# Patient Record
Sex: Female | Born: 1960
Health system: Southern US, Community
[De-identification: ages and names within clinical notes are randomized; demographics above are authoritative.]

## PROBLEM LIST (undated history)

## (undated) DIAGNOSIS — E119 Type 2 diabetes mellitus without complications: Secondary | ICD-10-CM

## (undated) DIAGNOSIS — I10 Essential (primary) hypertension: Secondary | ICD-10-CM

## (undated) DIAGNOSIS — J449 Chronic obstructive pulmonary disease, unspecified: Secondary | ICD-10-CM

## (undated) DIAGNOSIS — I442 Atrioventricular block, complete: Secondary | ICD-10-CM

## (undated) DIAGNOSIS — F329 Major depressive disorder, single episode, unspecified: Secondary | ICD-10-CM

## (undated) DIAGNOSIS — Z950 Presence of cardiac pacemaker: Secondary | ICD-10-CM

## (undated) DIAGNOSIS — E669 Obesity, unspecified: Secondary | ICD-10-CM

## (undated) DIAGNOSIS — Z720 Tobacco use: Secondary | ICD-10-CM

## (undated) DIAGNOSIS — F32A Depression, unspecified: Secondary | ICD-10-CM

## (undated) HISTORY — DX: Obesity, unspecified: E66.9

## (undated) HISTORY — DX: Chronic obstructive pulmonary disease, unspecified: J44.9

## (undated) HISTORY — DX: Type 2 diabetes mellitus without complications: E11.9

## (undated) HISTORY — DX: Essential (primary) hypertension: I10

## (undated) HISTORY — DX: Tobacco use: Z72.0

## (undated) HISTORY — PX: INSERT / REPLACE / REMOVE PACEMAKER: SUR710

## (undated) HISTORY — DX: Presence of cardiac pacemaker: Z95.0

## (undated) HISTORY — DX: Atrioventricular block, complete: I44.2

---

## 1998-05-07 ENCOUNTER — Encounter: Payer: Self-pay | Admitting: Emergency Medicine

## 1998-05-07 ENCOUNTER — Emergency Department (HOSPITAL_COMMUNITY): Admission: EM | Admit: 1998-05-07 | Discharge: 1998-05-07 | Payer: Self-pay | Admitting: Emergency Medicine

## 1999-03-20 ENCOUNTER — Other Ambulatory Visit: Admission: RE | Admit: 1999-03-20 | Discharge: 1999-03-20 | Payer: Self-pay | Admitting: *Deleted

## 1999-05-25 ENCOUNTER — Emergency Department (HOSPITAL_COMMUNITY): Admission: EM | Admit: 1999-05-25 | Discharge: 1999-05-25 | Payer: Self-pay | Admitting: Emergency Medicine

## 1999-10-12 ENCOUNTER — Emergency Department (HOSPITAL_COMMUNITY): Admission: EM | Admit: 1999-10-12 | Discharge: 1999-10-12 | Payer: Self-pay | Admitting: Emergency Medicine

## 1999-10-12 ENCOUNTER — Encounter (INDEPENDENT_AMBULATORY_CARE_PROVIDER_SITE_OTHER): Payer: Self-pay | Admitting: *Deleted

## 1999-10-15 ENCOUNTER — Inpatient Hospital Stay (HOSPITAL_COMMUNITY): Admission: EM | Admit: 1999-10-15 | Discharge: 1999-10-15 | Payer: Self-pay | Admitting: Obstetrics

## 1999-11-05 ENCOUNTER — Encounter: Admission: RE | Admit: 1999-11-05 | Discharge: 1999-11-05 | Payer: Self-pay | Admitting: Obstetrics & Gynecology

## 2000-02-26 ENCOUNTER — Inpatient Hospital Stay (HOSPITAL_COMMUNITY): Admission: AD | Admit: 2000-02-26 | Discharge: 2000-02-26 | Payer: Self-pay | Admitting: Obstetrics

## 2000-03-07 HISTORY — PX: TUBAL LIGATION: SHX77

## 2000-03-07 HISTORY — PX: INCONTINENCE SURGERY: SHX676

## 2000-03-12 ENCOUNTER — Encounter (INDEPENDENT_AMBULATORY_CARE_PROVIDER_SITE_OTHER): Payer: Self-pay

## 2000-03-13 ENCOUNTER — Inpatient Hospital Stay (HOSPITAL_COMMUNITY): Admission: AD | Admit: 2000-03-13 | Discharge: 2000-03-14 | Payer: Self-pay | Admitting: Obstetrics and Gynecology

## 2002-01-23 ENCOUNTER — Encounter: Payer: Self-pay | Admitting: Family Medicine

## 2002-01-23 ENCOUNTER — Ambulatory Visit (HOSPITAL_COMMUNITY): Admission: RE | Admit: 2002-01-23 | Discharge: 2002-01-23 | Payer: Self-pay | Admitting: Family Medicine

## 2002-01-23 ENCOUNTER — Emergency Department (HOSPITAL_COMMUNITY): Admission: EM | Admit: 2002-01-23 | Discharge: 2002-01-23 | Payer: Self-pay | Admitting: Emergency Medicine

## 2002-01-31 ENCOUNTER — Ambulatory Visit (HOSPITAL_COMMUNITY): Admission: RE | Admit: 2002-01-31 | Discharge: 2002-01-31 | Payer: Self-pay | Admitting: Gastroenterology

## 2002-06-10 ENCOUNTER — Other Ambulatory Visit: Admission: RE | Admit: 2002-06-10 | Discharge: 2002-06-10 | Payer: Self-pay | Admitting: Obstetrics and Gynecology

## 2003-06-15 ENCOUNTER — Other Ambulatory Visit: Admission: RE | Admit: 2003-06-15 | Discharge: 2003-06-15 | Payer: Self-pay | Admitting: Obstetrics and Gynecology

## 2004-10-09 ENCOUNTER — Other Ambulatory Visit: Admission: RE | Admit: 2004-10-09 | Discharge: 2004-10-09 | Payer: Self-pay | Admitting: Obstetrics and Gynecology

## 2005-01-06 ENCOUNTER — Ambulatory Visit: Payer: Self-pay | Admitting: Internal Medicine

## 2005-01-27 ENCOUNTER — Emergency Department (HOSPITAL_COMMUNITY): Admission: EM | Admit: 2005-01-27 | Discharge: 2005-01-27 | Payer: Self-pay | Admitting: Emergency Medicine

## 2005-03-23 ENCOUNTER — Emergency Department (HOSPITAL_COMMUNITY): Admission: EM | Admit: 2005-03-23 | Discharge: 2005-03-23 | Payer: Self-pay | Admitting: Emergency Medicine

## 2005-04-02 ENCOUNTER — Ambulatory Visit (HOSPITAL_BASED_OUTPATIENT_CLINIC_OR_DEPARTMENT_OTHER): Admission: RE | Admit: 2005-04-02 | Discharge: 2005-04-02 | Payer: Self-pay

## 2005-04-02 ENCOUNTER — Encounter (INDEPENDENT_AMBULATORY_CARE_PROVIDER_SITE_OTHER): Payer: Self-pay | Admitting: *Deleted

## 2005-04-02 ENCOUNTER — Ambulatory Visit (HOSPITAL_COMMUNITY): Admission: RE | Admit: 2005-04-02 | Discharge: 2005-04-02 | Payer: Self-pay

## 2007-08-13 ENCOUNTER — Ambulatory Visit (HOSPITAL_COMMUNITY): Admission: RE | Admit: 2007-08-13 | Discharge: 2007-08-13 | Payer: Self-pay | Admitting: Obstetrics and Gynecology

## 2007-08-13 ENCOUNTER — Encounter (INDEPENDENT_AMBULATORY_CARE_PROVIDER_SITE_OTHER): Payer: Self-pay | Admitting: Obstetrics and Gynecology

## 2008-07-21 ENCOUNTER — Emergency Department (HOSPITAL_COMMUNITY): Admission: EM | Admit: 2008-07-21 | Discharge: 2008-07-21 | Payer: Self-pay | Admitting: Family Medicine

## 2009-02-12 ENCOUNTER — Telehealth: Payer: Self-pay | Admitting: Internal Medicine

## 2009-03-29 ENCOUNTER — Inpatient Hospital Stay (HOSPITAL_COMMUNITY): Admission: EM | Admit: 2009-03-29 | Discharge: 2009-03-31 | Payer: Self-pay | Admitting: Emergency Medicine

## 2009-03-29 ENCOUNTER — Encounter (INDEPENDENT_AMBULATORY_CARE_PROVIDER_SITE_OTHER): Payer: Self-pay | Admitting: Cardiology

## 2009-03-30 HISTORY — PX: PACEMAKER INSERTION: SHX728

## 2009-04-03 ENCOUNTER — Encounter: Admission: RE | Admit: 2009-04-03 | Discharge: 2009-04-03 | Payer: Self-pay | Admitting: Cardiology

## 2009-05-07 HISTORY — PX: NM MYOCAR PERF WALL MOTION: HXRAD629

## 2010-07-07 HISTORY — PX: COLONOSCOPY: SHX174

## 2010-07-27 ENCOUNTER — Emergency Department (HOSPITAL_COMMUNITY)
Admission: EM | Admit: 2010-07-27 | Discharge: 2010-07-27 | Payer: Self-pay | Source: Home / Self Care | Admitting: Family Medicine

## 2010-09-05 HISTORY — PX: TRANSTHORACIC ECHOCARDIOGRAM: SHX275

## 2010-10-11 LAB — COMPREHENSIVE METABOLIC PANEL
Albumin: 3.5 g/dL (ref 3.5–5.2)
BUN: 11 mg/dL (ref 6–23)
Calcium: 9 mg/dL (ref 8.4–10.5)
Creatinine, Ser: 0.78 mg/dL (ref 0.4–1.2)
Total Protein: 6.4 g/dL (ref 6.0–8.3)

## 2010-10-11 LAB — URINALYSIS, ROUTINE W REFLEX MICROSCOPIC
Bilirubin Urine: NEGATIVE
Nitrite: NEGATIVE
Specific Gravity, Urine: 1.005 (ref 1.005–1.030)
pH: 6.5 (ref 5.0–8.0)

## 2010-10-11 LAB — CARDIAC PANEL(CRET KIN+CKTOT+MB+TROPI)
CK, MB: 0.8 ng/mL (ref 0.3–4.0)
Total CK: 43 U/L (ref 7–177)
Troponin I: 0.01 ng/mL (ref 0.00–0.06)
Troponin I: 0.01 ng/mL (ref 0.00–0.06)

## 2010-10-11 LAB — CBC
HCT: 38.1 % (ref 36.0–46.0)
HCT: 40.3 % (ref 36.0–46.0)
HCT: 40.7 % (ref 36.0–46.0)
Hemoglobin: 13.2 g/dL (ref 12.0–15.0)
Hemoglobin: 14 g/dL (ref 12.0–15.0)
MCHC: 34.3 g/dL (ref 30.0–36.0)
MCV: 92.6 fL (ref 78.0–100.0)
MCV: 92.8 fL (ref 78.0–100.0)
MCV: 92.9 fL (ref 78.0–100.0)
Platelets: 146 10*3/uL — ABNORMAL LOW (ref 150–400)
Platelets: 176 10*3/uL (ref 150–400)
Platelets: 181 10*3/uL (ref 150–400)
RBC: 4.24 MIL/uL (ref 3.87–5.11)
RBC: 4.4 MIL/uL (ref 3.87–5.11)
RDW: 13.1 % (ref 11.5–15.5)
WBC: 7 10*3/uL (ref 4.0–10.5)
WBC: 7.2 10*3/uL (ref 4.0–10.5)
WBC: 9.1 10*3/uL (ref 4.0–10.5)

## 2010-10-11 LAB — PROTIME-INR: Prothrombin Time: 12.9 seconds (ref 11.6–15.2)

## 2010-10-11 LAB — BASIC METABOLIC PANEL
BUN: 12 mg/dL (ref 6–23)
BUN: 9 mg/dL (ref 6–23)
Chloride: 105 mEq/L (ref 96–112)
Chloride: 108 mEq/L (ref 96–112)
Creatinine, Ser: 0.78 mg/dL (ref 0.4–1.2)
GFR calc Af Amer: >60 mL/min (ref >60)
GFR calc non Af Amer: >60 mL/min (ref >60)
GFR calc non Af Amer: >60 mL/min (ref >60)
GFR calc non Af Amer: >60 mL/min (ref >60)
Glucose, Bld: 131 mg/dL — ABNORMAL HIGH (ref 70–99)
Potassium: 3.6 mEq/L (ref 3.5–5.1)
Potassium: 4.3 mEq/L (ref 3.5–5.1)
Potassium: 4.3 mEq/L (ref 3.5–5.1)
Sodium: 136 mEq/L (ref 135–145)
Sodium: 139 mEq/L (ref 135–145)

## 2010-10-11 LAB — LIPID PANEL
HDL: 50 mg/dL (ref >39)
LDL Cholesterol: 69 mg/dL (ref 0–99)
Triglycerides: 123 mg/dL (ref <150)
VLDL: 25 mg/dL (ref 0–40)

## 2010-10-11 LAB — DIFFERENTIAL
Eosinophils Relative: 0 % (ref 0–5)
Lymphocytes Relative: 27 % (ref 12–46)
Lymphocytes Relative: 27 % (ref 12–46)
Lymphs Abs: 2.5 10*3/uL (ref 0.7–4.0)
Monocytes Absolute: 0.5 10*3/uL (ref 0.1–1.0)
Monocytes Absolute: 0.7 10*3/uL (ref 0.1–1.0)
Monocytes Relative: 6 % (ref 3–12)
Neutro Abs: 5.2 10*3/uL (ref 1.7–7.7)
Neutrophils Relative %: 67 % (ref 43–77)

## 2010-10-11 LAB — HEMOGLOBIN A1C: Hgb A1c MFr Bld: 5.6 % (ref 4.6–6.1)

## 2010-10-11 LAB — APTT: aPTT: 35 seconds (ref 24–37)

## 2010-10-11 LAB — POCT CARDIAC MARKERS: Troponin i, poc: <0.05 ng/mL (ref 0.00–0.09)

## 2010-10-11 LAB — BRAIN NATRIURETIC PEPTIDE: Pro B Natriuretic peptide (BNP): 43 pg/mL (ref 0.0–100.0)

## 2010-10-11 LAB — CK TOTAL AND CKMB (NOT AT ARMC)
CK, MB: 0.9 ng/mL (ref 0.3–4.0)
Relative Index: INVALID (ref 0.0–2.5)

## 2010-10-11 LAB — RAPID URINE DRUG SCREEN, HOSP PERFORMED
Barbiturates: NOT DETECTED
Cocaine: NOT DETECTED
Opiates: NOT DETECTED
Tetrahydrocannabinol: NOT DETECTED

## 2010-10-11 LAB — TSH: TSH: 4.015 u[IU]/mL (ref 0.350–4.500)

## 2010-11-19 NOTE — Op Note (Signed)
Mandy Sellers, Mandy Sellers             ACCOUNT NO.:  [id number]   MEDICAL RECORD NO.:  [id number]          PATIENT TYPE:  AMB   LOCATION:  SDC                           FACILITY:  [facility]   PHYSICIAN:  Juluis Mire, M.D.   DATE OF BIRTH:  [birth year]   DATE OF PROCEDURE:  08/13/2007  DATE OF DISCHARGE:                               OPERATIVE REPORT   PREOPERATIVE DIAGNOSIS:  Endometrial polyps.   POSTOPERATIVE DIAGNOSIS:  Endometrial polyps.   PROCEDURE:  Paracervical block, cervical dilatation, hysteroscopy,  resection of polyps and endometrial curettings.   SURGEON:  Juluis Mire, M.D.   ANESTHESIA:  Paracervical block as well as general.   ESTIMATED BLOOD LOSS:  Minimal.   PACKS AND DRAINS:  None.   INTRAOPERATIVE BLOOD REPLACED:  None.   COMPLICATIONS:  None.   INDICATIONS:  Were dictated in history and physical.   PROCEDURE IN DETAIL:  The patient was taken to the OR and placed in  supine position.  After satisfactory level of general anesthesia was  obtained the patient was placed in a dorsal lithotomy position using the  Allen stirrups.  Perineum and vagina were prepped out with Betadine and  draped in a sterile field.  Speculum was placed in the vaginal vault.  Cervix was grasped with a single tooth tenaculum.  Paracervical block  was instituted using 1% Nesacaine.  The uterus was sounded to 8 cm.  The  cervix was serially dilated to a size 35 Pratt dilator.  Operative  hysteroscope was introduced and the intrauterine cavity was distended  using sorbitol.  Visualization did reveal multiple polyps along the  posterior and anterior wall.  These were all resected and sent for  pathological review.  At the end we had resected all the polyps.  We  then did endometrial curettings.  All this was sent for pathological  review.  Deficit was minimal.  There was no evidence of perforation or  active bleeding.  At this point the  hysteroscope was removed.  The speculum and single  tooth tenaculum were  removed.  The patient was taken out of the dorsal lithotomy position and  was alert and extubated and transferred to recovery room in good  condition.  Sponge, instrument and needle count was correct by  circulating nurse x2.      Juluis Mire, M.D.  Electronically Signed     JSM/MEDQ  D:  08/13/2007  T:  08/14/2007  Job:  [id number]

## 2010-11-19 NOTE — H&P (Signed)
NAME:  Mandy Sellers, Mandy Sellers NO.:  [id number]   MEDICAL RECORD NO.:  [id number]          PATIENT TYPE:  AMB   LOCATION:  SDC                           FACILITY:  [facility]   PHYSICIAN:  Juluis Mire, M.D.   DATE OF BIRTH:  09-23-1960   DATE OF ADMISSION:  08/13/2007  DATE OF DISCHARGE:                              HISTORY & PHYSICAL   REASON FOR PRESENTATION:  The patient is a 50 year old gravida 6, para  3, abortus 3 female who presents for hysteroscopy.   Prior to admission, the patient was having some lower abdominal pain and  discomfort. Underwent an ultrasound.  There was increase in endometrial  thickness.  Subsequent saline infusion ultrasound did reveal multiple  endometrial polyps.  She had some free fluid on ultrasound that  resolved, otherwise it was unremarkable.  Her cycles remain relatively  regular at the present time.  There has not been any significant change  in flow or dysmenorrhea, but again multiple polyps were noted.  She  presents now for hysteroscopic evaluation and resection.   ALLERGIES:  NO KNOWN DRUG ALLERGIES.   MEDICATIONS:  Vitamins.   PAST MEDICAL HISTORY:  Usual childhood diseases without any significant  sequelae.   PAST SURGICAL HISTORY:  1. She has had a previous retropubic suspension of the bladder.  2. Bilateral tubal ligation in 2001.  3. She has had 3 vaginal deliveries.   FAMILY HISTORY:  History of heart disease as well as diabetes.   SOCIAL HISTORY:  Does reveal 1-1/2 packs a day of tobacco use and  occasional alcohol use.   REVIEW OF SYSTEMS:  Noncontributory.   PHYSICAL EXAMINATION:  VITAL SIGNS:  Stable.  The patient is afebrile.  HEENT:  Face is normocephalic.  Pupils are equal, round, and reactive to  light and accommodation.  Extraocular movements were intact.  Sclerae  and conjunctivae are clear.  Oropharynx clear.  NECK:  Without thyromegaly.  BREASTS:  No discrete masses.  LUNGS:  Clear.  CARDIOVASCULAR:   Regular in rate.  No murmurs or gallops.  ABDOMEN:  Benign.  PELVIC:  Normal external genitalia.  Vaginal cuff is clear.  Cervix  unremarkable.  Usual shape, size and contour.  Adnexa free of mass or  tenderness.  EXTREMITIES:  Trace edema.  NEUROLOGIC:  Grossly within normal limits.   IMPRESSION:  Endometrial polyps.   PLAN:  The patient will undergo hysteroscopy and resection of polyps.  The risks of surgery have been discussed including the risk of  infection.  The risk of vascular injury that could lead to hemorrhage  requiring transfusion with the risk of AIDS or hepatitis and the  possible need for hysterectomy.  Risk of injury to adjacent organs  including bladder, bowel, or ureters could require further exploratory  surgery.  Risk of deep venous thrombosis and pulmonary embolus.  The  patient voiced understanding of indications and risks.      Juluis Mire, M.D.  Electronically Signed     JSM/MEDQ  D:  08/13/2007  T:  08/14/2007  Job:  [id number]

## 2010-11-22 NOTE — Op Note (Signed)
TNAMESYLVANIA, MOSS                      ACCOUNT NO.:  [id number]   MEDICAL RECORD NO.:  [id number]                   PATIENT TYPE:  AMB   LOCATION:  ENDO                                 FACILITY:  [facility]   PHYSICIAN:  Charolett Bumpers, M.D.             DATE OF BIRTH:  07-13-1960   DATE OF PROCEDURE:  01/31/2002  DATE OF DISCHARGE:  01/31/2002                                 OPERATIVE REPORT   REFERRING PHYSICIAN:  Zigmund Daniel, M.D. and Thora Lance, M.D.   INDICATIONS FOR PROCEDURE:  Mandy Sellers is a 50 year old female born  on 1960-11-01.  Ms. Berland recently received therapy for presumptive  acute diverticulitis.  Her symptoms have resolved on antibiotic therapy.  CT  scan of the abdomen and pelvis performed on January 23, 2002, revealed focal  diverticulitis involving the ascending colon near the hepatic flexure versus  a colon cancer with adjacent inflammation or invasion.  Diagnostic  colonoscopy is scheduled to evaluate the colonic mucosa.   I discussed with Ms. Markson the complications associated with colonoscopy  and polypectomy including a 15 per 1000 risk of bleeding and 4 per 1000 risk  of colon perforation requiring surgical repair.  Ms. Bobrowski has signed the  operative permit.   ENDOSCOPIST:  Charolett Bumpers, M.D.   PREMEDICATION:  Versed 5 mg, Demerol 50 mg.   ENDOSCOPE:  Olympus pediatric colonoscope.   DESCRIPTION OF PROCEDURE:  After obtaining informed consent, Ms. Tebbetts was  placed into the left lateral decubitus position.  I administered intravenous  Demerol and intravenous Versed to achieve conscious sedation for the  procedure.  The patient's blood pressure, oxygen saturation, and cardiac  rhythm were monitored throughout the procedure and documented in the medical  record.   Anal inspection was normal.  Digital rectal examination was normal.  The  Olympus pediatric video colonoscope was introduced into the rectum and  advanced to the cecum.  A normal appearing ileocecal valve was intubated and  the distal ileum inspected.  Colonic preparation for the examination today  was excellent.   Rectum normal.   Sigmoid colon and descending colon.  A few small diverticula are noted.   Splenic flexure normal.   Transverse colon normal.   Hepatic flexure and ascending colon.  The mucosa appears normal.  There are  a few small diverticula noted in the right colon.   Cecum and ileocecal valve normal.   Distal ileum normal.   ASSESSMENT:  There are a few small diverticula involving the right colon and  left colon; otherwise normal proctocolonoscopy to the cecum.  No endoscopic  evidence for the presence of colorectal neoplasia, inflammatory bowel  disease, or active diverticulitis.  Charolett Bumpers, M.D.    MKJ/MEDQ  D:  01/31/2002  T:  02/03/2002  Job:  [id number]   cc:   Thora Lance, M.D.   Skeet Simmer., M.D.  Fax: [phone]

## 2010-11-22 NOTE — H&P (Signed)
[facility]  Patient:    Mandy Sellers, Mandy Sellers                         MRN: [id number] Adm. Date:  03/12/00 Attending:  Juluis Mire, M.D.                         History and Physical  CHIEF COMPLAINT:              The patient is a 50 year old gravida 5 para 3 abortus 2 married white female, who presents for retropubic suspension of the bladder and bilateral tubal ligation.  HISTORY OF PRESENT ILLNESS:   In relation to the present admission, the patient initially was referred to our practice on Dec 04, 1999 by Dr. Valentina Lucks for evaluation of stress urinary incontinence and pelvic relaxation.  The patient was also desirous of permanent sterilization.  The patient described urinary leakage with activity such as coughing, sneezing, or heavy lifting. This was becoming extremely limiting in terms of her activity.  Her cycles were regular at that time with no significant discomfort or change in flow pattern.  She had had a normal gynecologic examination and Pap smear in September 2000.  Subsequently we did do bladder evaluation in the office and she had a positive Q-tip test.  She had normal filling volumes and no evidence of uninhibited bladder contractions.  She did demonstrate a small amount of leakage with coughing consistent with anatomical stress urinary incontinence. Mild pelvic relaxation was also noted with uterine descensus, cystocele, and rectocele.  After discussion of options the patient decided to proceed with permanent sterilization as well as retropubic suspension of the bladder.  We discussed the option of hysterectomy at the same time, which she does decline. In terms of sterilization, alternative forms of birth control were discussed. A failure rate of 1/200 was quoted with failure resulting in form of ectopic pregnancy requiring further surgical management.  ALLERGIES:                    No known drug allergies.  MEDICATIONS:                   None.  PAST MEDICAL HISTORY:         Usual childhood diseases with no significant sequelae.  PAST SURGICAL HISTORY:        No previous surgical history.  PAST OBSTETRICAL HISTORY:     1. Three spontaneous vaginal deliveries.                               2. Two spontaneous abortions.  FAMILY HISTORY:               Father has a history of hypertension and heart disease.  Both parents have diabetes mellitus.  SOCIAL HISTORY:               Smokes approximately a pack of cigarettes per day.  Occasional alcohol use.  REVIEW OF SYSTEMS:            Noncontributory.  PHYSICAL EXAMINATION:  VITAL SIGNS:                  The patient is afebrile and has stable vital signs.  HEENT:  Head normocephalic.  PERRLA.  EOMI.  Sclerae and conjunctivae clear.  Oropharynx clear.  NECK:                         Without thyromegaly.  BREAST:                       Not examined.  LUNGS:                        Clear.  CARDIAC:                      Regular rate and rhythm without murmurs or gallops.  ABDOMEN:                      Benign.  PELVIC:                       Normal external genitalia.  Vaginal mucosa with cystocele, rectocele, and mild uterine descensus.  Uterus normal size, shape, and contour.  No discrete masses or tenderness.  EXTREMITIES:                  Trace edema.  NEUROLOGIC:                   Grossly within normal limits.  IMPRESSION:                   1. Stress urinary incontinence with associated                                  pelvic relaxation.                               2. Multiparity, desire for sterility.  PLAN:                         The patient is to undergo retropubic suspension of the bladder and bilateral tubal ligation.  The risks of surgery have been discussed including the risks of anesthesia, the risks of infection, the risks of hemorrhage which could necessitate transfusion with the risk of AIDS or hepatitis, the risk of injury  to adjacent organs including bladder, bowel, or ureters that could require further exploratory surgery, the risk of deep vein thrombosis and pulmonary emboli.  With retropubic suspension of the bladder we will also place a suprapubic catheter and the need for bladder retraining was discussed.  We have discussed the potential need for long-term catheterization.  At times there have been reports of need to take down the retropubic suspension so that voiding function can return.  We have also discussed the overall success rate of 80-90% with potential recurrent urinary leakage in the future requiring further surgical management.  The patient expressed understanding of the indications, risks, and options. DD:  03/12/00 TD:  03/12/00 Job: [id number]

## 2010-11-22 NOTE — Op Note (Signed)
[facility]  Patient:    Mandy Sellers, Mandy Sellers                       MRN: [id number] Proc. Date: 03/12/00 Adm. Date:  [id number] Attending:  Frederich Balding                           Operative Report  PREOPERATIVE DIAGNOSES:       1. Anatomical stress urinary incontinence.                               2. Multiparity.                               3. Desires sterility.  POSTOPERATIVE DIAGNOSES:      1. Anatomical stress urinary incontinence.                               2. Multiparity.                               3. Desires sterility.  OPERATIVE PROCEDURE:          1. Retropubic suspension of bladder using                                  the Burch procedure.                               2. Bilateral tubal ligation.                               3. Cystotomy with placement of suprapubic                                  catheter.  SURGEON:                      Juluis Mire, M.D.  ASSISTANTFreddy Finner, M.D.  ANESTHESIA:                   General endotracheal.  ESTIMATED BLOOD LOSS:         200 cc.  PACKS AND DRAINS:             None.  INTRAOPERATIVE BLOOD:         None.  COMPLICATIONS:                None.  INDICATIONS:                  Dictated in the history and physical.  DESCRIPTION OF PROCEDURE:     The patient was taken to the operating room and placed in the supine position.  After a satisfactory level of general endotracheal anesthesia was obtained, the patient was placed in the dorsal supine position using Allen stirrups.  The abdomen, perineum,  and vagina were prepped out with Betadine.  Examination under anesthesia revealed the uterus to be anterior, normal size and shape.  The adnexa were unremarkable.  A three-way Foley is put in place.  The patient is draped out for surgery.  A low transverse skin incision is made with the knife and carried to the subcutaneous tissue.  The anterior rectus fascia  was entered sharply and the incision in the fascia was then extended laterally.  The fascia was then taken off of the muscle superiorly and inferiorly.  The rectus muscles were then separated in the midline.  A Balfour retractor was put in place to separate the muscles.  Next, the retroperitoneal space was developed.  The gloved hand was placed in the vaginal vault for elevation.  The space of Retzius easily developed.  The bladder was dissected off of the underlying perivaginal tissue bilaterally using blunt dissection.  Next, using #2-0 sutures of Prolene, two helical sutures were placed at each side of the urethrovesical angle and secured to the Coopers ligament.  Two more lateral sutures of #2-0 Prolene were also placed on each side more laterally.  These were all secured to Coopers ligaments, and tied down with good elevation of the bladder, and elevation of the urethrovesical angle.  There was no active bleeding.  We had excellent hemostasis.  Next, the peritoneum was incised.  The incision in the peritoneum was extended both superiorly and inferiorly.  There was a small nick in the small bowel. It only went to the muscularis, not into the mucosa.  This was closed with one interrupted suture of #3-0 Vicryl.  Next the uterus was elevated.  The tubes and ovaries were identified and noted to be unremarkable.  A midsegment of each tube was elevated with the Babcock tenaculum.  A hole was made in the avascular mesosalpinx.  Individual ligatures of #0 plain catgut were used to ligate off a segment of tube.  The intervening segment of tube was  then excised.  The cut end of the tubes were cauterized using the Bovie.  Good hemostasis was noted.  We had excellent hemostasis.  She really had a very shallow cul-de-sac.  We did not try to do a culdoplasty, because it was so shallow.  At this point in time the peritoneum was closed in a running suture of #3-0 Vicryl.  Next, the bladder was filled  with a three-way Foley with sterile saline.  A cystotomy incision was made and the cystoscope was then reduced into the bladder fundus.  Visualization revealed no evidence of suture into the bladder mucosa.  Both ureteral orifices were visualized.  The patient had been given indigo carmine, and she had free spillage of blue urine bilaterally.  Next, a bonnano catheter was put in place through the cystotomy incision, and sutures of #3-0 Vicryl were used to bring the muscularis over the cystotomy site.  This was intact to further distention with saline.  The bonnano catheter was secured to the skin with silk.  The rectus muscles were then brought together with #3-0 Vicryl.  The fascia was closed with running sutures of #0 PDS.  The skin was closed with staples and Steri-Strips.  The sponge, instrument, and needle counts were reported as correct by the circulating nurse x 2.  The patient tolerated the procedure well, once extubated, was transferred to the recovery room in good condition. DD:  03/12/00 TD:  03/14/00 Job: [id number]

## 2010-11-22 NOTE — Op Note (Signed)
NAMENOVALEIGH, Mandy Sellers NO.:  [id number]   MEDICAL RECORD NO.:  [id number]          PATIENT TYPE:  AMB   LOCATION:  DSC                          FACILITY:  [facility]   PHYSICIAN:  Lorre Munroe., M.D.DATE OF BIRTH:  July 28, 1960   DATE OF PROCEDURE:  04/02/2005  DATE OF DISCHARGE:                                 OPERATIVE REPORT   PREOPERATIVE DIAGNOSIS:  Pilonidal cyst.   POSTOPERATIVE DIAGNOSIS:  Pilonidal cyst.   OPERATION:  Pilonidal cystectomy.   SURGEON:  Lebron Conners, M.D.   ANESTHESIA:  General and local.   SPECIMEN:  Pilonidal cyst.   BLOOD LOSS:  Minimal.   COMPLICATIONS:  None.   INDICATIONS:  To PACU good.   PROCEDURE:  After the patient was monitored and anesthetized, turned in  prone position and had routine preparation and draping of the area over the  sacrum and coccyx, I liberally infused local anesthetic in that area as  well. I made an elliptical incision around all of the midline sinuses and  scars on the side where drainage had been done. Hair was noted in the  pilonidal cyst. There was chronic granulation tissue present and I excised  all the granulation tissue. Only normal fat remained in the depths of the  wound after excision of the cyst. I got hemostasis with cautery and packed  the wound with moist gauze, and applied a bulky compressive bandage. The  patient tolerated the operation well.      Lorre Munroe., M.D.  Electronically Signed     WB/MEDQ  D:  04/02/2005  T:  04/03/2005  Job:  [id number]

## 2010-11-22 NOTE — Discharge Summary (Signed)
[facility]  Patient:    Mandy Sellers, Mandy Sellers                       MRN: [id number] Adm. Date:  [id number] Disc. Date: [id number] Attending:  Frederich Balding                           Discharge Summary  ADMITTING DIAGNOSES:          Anatomical stress urinary incontinence,                               multipara, desires sterility.  OPERATIVE PROCEDURE:          Retropubic suspension of the bladder with bilateral tubal ligation.  Cystoscopy and placement of suprapubic catheter.  For complete history and physical, please see dictated noted.  HOSPITAL COURSE:              The patient underwent the above-noted surgery. Postoperatively, she did well.  Postoperative hemoglobin was 11.3.  During her first postoperative day, she was maintained ______ due to some nausea and diet intolerance.  We began suprapubic clamping.  By the following morning, postvoid residuals were under 100 cc, and the suprapubic catheter was removed. At the time of discharge, she was afebrile.  She was tolerating a regular diet without difficulty.  She had a low transverse incision which was intact. Bowel sounds were active.  COMPLICATIONS:                None.  CONDITION ON DISCHARGE:       The patient was discharged home in stable condition.  DISPOSITION:                  Routine postoperative instructions are given. She is to avoid heavy lifting, vaginal entry, or driving a car.  She will watch for signs of infection, nausea, vomiting, increased abdominal pain, or active vaginal bleeding.  FOLLOW-UP:                    Follow up in the office in one week. DD:  03/14/00 TD:  03/15/00 Job: [id number]

## 2011-02-19 ENCOUNTER — Encounter: Payer: Self-pay | Admitting: Internal Medicine

## 2011-03-28 LAB — CBC
HCT: 43.5
Hemoglobin: 15.3 — ABNORMAL HIGH
MCHC: 35.2
Platelets: 197
RDW: 13

## 2011-04-01 ENCOUNTER — Other Ambulatory Visit: Payer: Self-pay | Admitting: Internal Medicine

## 2011-04-10 ENCOUNTER — Ambulatory Visit (HOSPITAL_COMMUNITY)
Admission: RE | Admit: 2011-04-10 | Discharge: 2011-04-10 | Disposition: A | Discharge status: Home / Self Care | Payer: 59 | Source: Ambulatory Visit | Attending: Gastroenterology | Admitting: Gastroenterology

## 2011-04-10 DIAGNOSIS — Z1211 Encounter for screening for malignant neoplasm of colon: Secondary | ICD-10-CM | POA: Insufficient documentation

## 2011-04-15 NOTE — Op Note (Signed)
  NAMETARISA, [name] NO.:  [id number]  MEDICAL RECORD NO.:  [id number]  LOCATION:  WLEN                         FACILITY:  [facility]  PHYSICIAN:  Danise Edge, M.D.   DATE OF BIRTH:  [birth year]  DATE OF PROCEDURE:  04/10/2011 DATE OF DISCHARGE:                              OPERATIVE REPORT   REFERRING PHYSICIAN:  Thora Lance, M.D.  HISTORY:  Ms. Mandy Sellers is a 50 year old female, scheduled to undergo a repeat screening colonoscopy with polypectomy to prevent colon cancer.  The patient underwent a normal colonoscopy in 2003.  ENDOSCOPIST:  Danise Edge, M.D.  PREMEDICATION: 1. Fentanyl 100 mcg. 2. Versed 10 mg.  PROCEDURE:  The patient was placed in the left lateral decubitus position.  Anal inspection and digital rectal exam were normal.  The Pentax pediatric colonoscope was introduced into the rectum and easily advanced to the cecum.  A normal-appearing ileocecal valve and appendiceal orifice were identified.  Colonic preparation for the exam today was good.  Rectum normal.  Retroflexed view of the distal rectum normal.  Sigmoid colon.  There were a few tiny diverticula present.  Descending colon normal.  Splenic flexure normal.  Transverse colon normal.  Hepatic flexure normal.  Ascending colon normal.  Cecum and ileocecal valve normal.  ASSESSMENT:  Normal screening proctocolonoscopy of the cecum.  RECOMMENDATIONS:  Repeat screening colonoscopy in 10 years.          ______________________________ Danise Edge, M.D.     MJ/MEDQ  D:  04/10/2011  T:  04/10/2011  Job:  [id number]  cc:   Thora Lance, M.D. Fax: [phone]  Electronically Signed by Danise Edge M.D. on 04/15/2011 04:09:44 PM

## 2012-08-10 ENCOUNTER — Encounter (HOSPITAL_COMMUNITY): Payer: Self-pay | Admitting: *Deleted

## 2012-08-10 ENCOUNTER — Emergency Department (HOSPITAL_COMMUNITY)
Admission: EM | Admit: 2012-08-10 | Discharge: 2012-08-10 | Disposition: A | Discharge status: Home / Self Care | Payer: 59 | Source: Home / Self Care | Attending: Emergency Medicine | Admitting: Emergency Medicine

## 2012-08-10 DIAGNOSIS — A088 Other specified intestinal infections: Secondary | ICD-10-CM

## 2012-08-10 DIAGNOSIS — A084 Viral intestinal infection, unspecified: Secondary | ICD-10-CM

## 2012-08-10 MED ORDER — ONDANSETRON 4 MG PO TBDP
8.0000 mg | ORAL_TABLET | Freq: Once | ORAL | Status: AC
  Administered 2012-08-10: 8 mg via ORAL

## 2012-08-10 MED ORDER — DIPHENOXYLATE-ATROPINE 2.5-0.025 MG PO TABS: 1.0000 | ORAL_TABLET | Freq: Four times a day (QID) | ORAL | Status: DC | PRN

## 2012-08-10 MED ORDER — ONDANSETRON 8 MG PO TBDP: 8.0000 mg | ORAL_TABLET | Freq: Three times a day (TID) | ORAL | Status: DC | PRN

## 2012-08-10 MED ORDER — ONDANSETRON 4 MG PO TBDP
ORAL_TABLET | ORAL | Status: AC
  Filled 2012-08-10: qty 2

## 2012-08-10 NOTE — ED Provider Notes (Signed)
Chief Complaint  Patient presents with  . Emesis    History of Present Illness:   Mandy Sellers is a 52 year old female who has had a two-day history of nausea, vomiting, and diarrhea. There has been a family exposure. She had 4 episodes of emesis yesterday and none today. The emesis has been yellow in color. She denies any hematemesis, coffee-ground emesis, or bilious emesis. She's also had diarrhea with 6+ stools yesterday and today. No hematochezia or melena. She has noted fever, chills, myalgias, weakness, and upper abdominal pain. She has some URI symptoms last week. She has a pacemaker and high blood pressure. She is a cigarette smoker. She takes antiarrhythmic for heart, venlafaxine, hydrochlorothiazide, and aspirin.  Review of Systems:  Other than noted above, the patient denies any of the following symptoms: Systemic:  No fevers, chills, sweats, weight loss or gain, fatigue, or tiredness. ENT:  No nasal congestion, rhinorrhea, or sore throat. Lungs:  No cough, wheezing, or shortness of breath. Cardiac:  No chest pain, syncope, or presyncope. GI:  No abdominal pain, nausea, vomiting, anorexia, diarrhea, constipation, blood in stool or vomitus. GU:  No dysuria, frequency, or urgency.  PMFSH:  Past medical history, family history, social history, meds, and allergies were reviewed.  Physical Exam:   Vital signs:  BP 118/78  Pulse 87  Temp 98.6 F (37 C) (Oral)  Resp 26  SpO2 93%  LMP 07/10/2012 General:  Alert and oriented.  In no distress.  Skin warm and dry.  Good skin turgor, brisk capillary refill. ENT:  No scleral icterus, moist mucous membranes, no oral lesions, pharynx clear. Lungs:  Breath sounds clear and equal bilaterally.  No wheezes, rales, or rhonchi. Heart:  Rhythm regular, without extrasystoles.  No gallops or murmers. Abdomen:  There is slight epigastric tenderness to palpation, no guarding or rebound, no organomegaly or mass. Bowel sounds are hyperactive. Skin:  Clear, warm, and dry.  Good turgor.  Brisk capillary refill.  Course in Urgent Care Center:   She was given Zofran ODT one sublingually.  Assessment:  The encounter diagnosis was Viral gastroenteritis.  Plan:   1.  The following meds were prescribed:   New Prescriptions   DIPHENOXYLATE-ATROPINE (LOMOTIL) 2.5-0.025 MG PER TABLET    Take 1 tablet by mouth 4 (four) times daily as needed for diarrhea or loose stools.   ONDANSETRON (ZOFRAN ODT) 8 MG DISINTEGRATING TABLET    Take 1 tablet (8 mg total) by mouth every 8 (eight) hours as needed for nausea.   2.  The patient was instructed in symptomatic care and handouts were given. 3.  The patient was told to return if becoming worse in any way, if no better in 2 or 3 days, and given some red flag symptoms that would indicate earlier return. 4.  The patient was told to take only sips of clear liquids for the next 24 hours and then advance to a b.r.a.t. Diet.      Reuben Likes, MD 08/10/12 2221

## 2012-08-10 NOTE — ED Notes (Signed)
PT  REPORTS  SYMPTOMS  OF  VOMITING  /  DIARRHEA              SINCE YESTERDAY            RECENTLTY     GOT  OVER  AN URI           SHE  REPORTS  YEST  WEAS  UNABLE  TO  HOLD  DOWN LIQUIDS  BUT  TODAY  SHE IS ABLE  TO

## 2012-08-23 ENCOUNTER — Emergency Department (INDEPENDENT_AMBULATORY_CARE_PROVIDER_SITE_OTHER): Payer: 59

## 2012-08-23 ENCOUNTER — Emergency Department (HOSPITAL_COMMUNITY)
Admission: EM | Admit: 2012-08-23 | Discharge: 2012-08-23 | Disposition: A | Discharge status: Home / Self Care | Payer: 59 | Source: Home / Self Care | Attending: Emergency Medicine | Admitting: Emergency Medicine

## 2012-08-23 ENCOUNTER — Encounter (HOSPITAL_COMMUNITY): Payer: Self-pay | Admitting: *Deleted

## 2012-08-23 DIAGNOSIS — J189 Pneumonia, unspecified organism: Secondary | ICD-10-CM

## 2012-08-23 HISTORY — DX: Depression, unspecified: F32.A

## 2012-08-23 HISTORY — DX: Essential (primary) hypertension: I10

## 2012-08-23 HISTORY — DX: Major depressive disorder, single episode, unspecified: F32.9

## 2012-08-23 MED ORDER — CEFTRIAXONE SODIUM 1 G IJ SOLR
INTRAMUSCULAR | Status: AC
  Filled 2012-08-23: qty 10

## 2012-08-23 MED ORDER — LIDOCAINE HCL (PF) 1 % IJ SOLN
INTRAMUSCULAR | Status: AC
  Filled 2012-08-23: qty 5

## 2012-08-23 MED ORDER — CEFTRIAXONE SODIUM 1 G IJ SOLR
1.0000 g | Freq: Once | INTRAMUSCULAR | Status: AC
  Administered 2012-08-23: 1 g via INTRAMUSCULAR

## 2012-08-23 MED ORDER — AZITHROMYCIN 250 MG PO TABS: 250.0000 mg | ORAL_TABLET | Freq: Every day | ORAL | Status: DC

## 2012-08-23 NOTE — ED Notes (Signed)
Pt reports that she has been sick for the past 5 weeks with URI & stomach flu. Productive cough, body aches, sinus pressure

## 2012-08-23 NOTE — ED Provider Notes (Signed)
History     CSN: [id number]  Arrival date & time 08/23/12  1337   First MD Initiated Contact with Patient 08/23/12 1420      Chief Complaint  Patient presents with  . URI    (Consider location/radiation/quality/duration/timing/severity/associated sxs/prior treatment) Patient is a 52 y.o. female presenting with URI. The history is provided by the patient. No language interpreter was used.  URI Presenting symptoms: congestion and cough   Severity:  Moderate Onset quality:  Gradual Duration:  5 weeks Timing:  Constant Progression:  Worsening Chronicity:  New Relieved by:  Nothing Worsened by:  Nothing tried Associated symptoms: sinus pain   Risk factors comment:  Smoker   Past Medical History  Diagnosis Date  . Hypertension   . Depression     History reviewed. No pertinent past surgical history.  Family History  Problem Relation Age of Onset  . Family history unknown: Yes    History  Substance Use Topics  . Smoking status: Heavy Tobacco Smoker -- 1.50 packs/day    Types: Cigarettes  . Smokeless tobacco: Not on file  . Alcohol Use: Yes     Comment: social    OB History   Grav Para Term Preterm Abortions TAB SAB Ect Mult Living                  Review of Systems  HENT: Positive for congestion.   Respiratory: Positive for cough.   All other systems reviewed and are negative.    Allergies  Review of patient's allergies indicates no known allergies.  Home Medications   Current Outpatient Rx  Name  Route  Sig  Dispense  Refill  . BAYER ASPIRIN PO   Oral   Take by mouth.         . clonazePAM (KLONOPIN) 1 MG tablet   Oral   Take 1 mg by mouth 2 (two) times daily as needed.         . diphenoxylate-atropine (LOMOTIL) 2.5-0.025 MG per tablet   Oral   Take 1 tablet by mouth 4 (four) times daily as needed for diarrhea or loose stools.   16 tablet   0   . HYDROCHLOROTHIAZIDE PO   Oral   Take by mouth.         . Nebivolol HCl (BYSTOLIC  PO)   Oral   Take by mouth.         . ondansetron (ZOFRAN ODT) 8 MG disintegrating tablet   Oral   Take 1 tablet (8 mg total) by mouth every 8 (eight) hours as needed for nausea.   20 tablet   0   . venlafaxine (EFFEXOR) 75 MG tablet   Oral   Take 75 mg by mouth 2 (two) times daily.           BP 132/74  Pulse 101  Temp(Src) 97.4 F (36.3 C) (Oral)  SpO2 95%  LMP 07/10/2012  Physical Exam  Nursing note and vitals reviewed. Constitutional: She appears well-developed and well-nourished.  HENT:  Head: Normocephalic.  Right Ear: External ear normal.  Left Ear: External ear normal.  Nose: Nose normal.  Mouth/Throat: Oropharynx is clear and moist.  Eyes: Conjunctivae and EOM are normal. Pupils are equal, round, and reactive to light.  Neck: Normal range of motion. Neck supple.  Cardiovascular: Normal rate.   Pulmonary/Chest: Effort normal.  Abdominal: Soft.  Musculoskeletal: Normal range of motion.  Neurological: She is alert.  Skin: Skin is warm.    ED Course  Procedures (including critical care time)  Labs Reviewed - No data to display Dg Chest 2 View  08/23/2012  *RADIOLOGY REPORT*  Clinical Data: Cough.  CHEST - 2 VIEW  Comparison: Two-view chest 04/03/2009.  Findings: New right middle lobe airspace disease is present.  The heart is mildly enlarged.  Mild pulmonary vascular congestion is evident.  The visualized soft tissues and bony thorax are unremarkable.  Pacing wires are stable in position.  IMPRESSION:  1.  New right middle lobe airspace disease is concerning for early pneumonia. 2.  Mild cardiomegaly and pulmonary vascular congestion.   Original Report Authenticated By: Marin Roberts, M.D.      1. Community acquired pneumonia       MDM  Pneumonia,  Pt given rocephin Im and zithromax.   Pt request steroid shot.   I am going to treat with antibiotics first.        Elson Areas, [place] 08/23/12 1644

## 2012-08-28 NOTE — ED Provider Notes (Signed)
Medical screening examination/treatment/procedure(s) were performed by non-physician practitioner and as supervising physician I was immediately available for consultation/collaboration.  Leslee Home, M.D.   Reuben Likes, MD 08/28/12 [phone]

## 2012-10-25 ENCOUNTER — Encounter: Payer: Self-pay | Admitting: *Deleted

## 2012-12-13 ENCOUNTER — Other Ambulatory Visit: Payer: Self-pay | Admitting: Cardiovascular Disease

## 2012-12-13 DIAGNOSIS — I442 Atrioventricular block, complete: Secondary | ICD-10-CM

## 2012-12-13 LAB — PACEMAKER DEVICE OBSERVATION

## 2012-12-20 ENCOUNTER — Encounter: Payer: Self-pay | Admitting: *Deleted

## 2012-12-20 LAB — REMOTE PACEMAKER DEVICE
AL IMPEDENCE PM: 567 Ohm
ATRIAL PACING PM: 0.4
BATTERY VOLTAGE: 2.8 V
RV LEAD IMPEDENCE PM: 632 Ohm
VENTRICULAR PACING PM: 100

## 2013-01-11 ENCOUNTER — Encounter: Payer: Self-pay | Admitting: Cardiovascular Disease

## 2013-03-28 ENCOUNTER — Other Ambulatory Visit: Payer: Self-pay | Admitting: Cardiovascular Disease

## 2013-03-28 DIAGNOSIS — I442 Atrioventricular block, complete: Secondary | ICD-10-CM

## 2013-03-28 LAB — PACEMAKER DEVICE OBSERVATION

## 2013-03-30 ENCOUNTER — Encounter: Payer: Self-pay | Admitting: *Deleted

## 2013-03-30 LAB — REMOTE PACEMAKER DEVICE
AL AMPLITUDE: 2.8 mv
AL THRESHOLD: 0.375 V
BAMS-0001: 175 {beats}/min
RV LEAD IMPEDENCE PM: 632 Ohm
RV LEAD THRESHOLD: 0.5 V
VENTRICULAR PACING PM: 100

## 2013-05-12 ENCOUNTER — Other Ambulatory Visit: Payer: Self-pay

## 2013-06-26 ENCOUNTER — Encounter (HOSPITAL_COMMUNITY): Payer: Self-pay | Admitting: Emergency Medicine

## 2013-06-26 ENCOUNTER — Emergency Department (HOSPITAL_COMMUNITY)
Admission: EM | Admit: 2013-06-26 | Discharge: 2013-06-26 | Disposition: A | Discharge status: Home / Self Care | Payer: 59 | Attending: Emergency Medicine | Admitting: Emergency Medicine

## 2013-06-26 ENCOUNTER — Emergency Department (HOSPITAL_COMMUNITY): Payer: 59

## 2013-06-26 DIAGNOSIS — F329 Major depressive disorder, single episode, unspecified: Secondary | ICD-10-CM | POA: Insufficient documentation

## 2013-06-26 DIAGNOSIS — R630 Anorexia: Secondary | ICD-10-CM | POA: Insufficient documentation

## 2013-06-26 DIAGNOSIS — F3289 Other specified depressive episodes: Secondary | ICD-10-CM | POA: Insufficient documentation

## 2013-06-26 DIAGNOSIS — Z792 Long term (current) use of antibiotics: Secondary | ICD-10-CM | POA: Insufficient documentation

## 2013-06-26 DIAGNOSIS — R109 Unspecified abdominal pain: Secondary | ICD-10-CM

## 2013-06-26 DIAGNOSIS — I10 Essential (primary) hypertension: Secondary | ICD-10-CM | POA: Insufficient documentation

## 2013-06-26 DIAGNOSIS — F17200 Nicotine dependence, unspecified, uncomplicated: Secondary | ICD-10-CM | POA: Insufficient documentation

## 2013-06-26 DIAGNOSIS — R110 Nausea: Secondary | ICD-10-CM | POA: Insufficient documentation

## 2013-06-26 DIAGNOSIS — R1031 Right lower quadrant pain: Secondary | ICD-10-CM | POA: Insufficient documentation

## 2013-06-26 DIAGNOSIS — Z79899 Other long term (current) drug therapy: Secondary | ICD-10-CM | POA: Insufficient documentation

## 2013-06-26 LAB — CBC WITH DIFFERENTIAL/PLATELET
Basophils Absolute: 0.1 10*3/uL (ref 0.0–0.1)
Eosinophils Absolute: 0.3 10*3/uL (ref 0.0–0.7)
Eosinophils Relative: 3 % (ref 0–5)
HCT: 41.3 % (ref 36.0–46.0)
Lymphocytes Relative: 22 % (ref 12–46)
MCH: 30.9 pg (ref 26.0–34.0)
MCV: 87.9 fL (ref 78.0–100.0)
Monocytes Absolute: 0.6 10*3/uL (ref 0.1–1.0)
RDW: 12.9 % (ref 11.5–15.5)
WBC: 10.3 10*3/uL (ref 4.0–10.5)

## 2013-06-26 LAB — URINALYSIS, ROUTINE W REFLEX MICROSCOPIC
Leukocytes, UA: NEGATIVE
Nitrite: NEGATIVE
Specific Gravity, Urine: 1.009 (ref 1.005–1.030)
Urobilinogen, UA: 0.2 mg/dL (ref 0.0–1.0)

## 2013-06-26 LAB — COMPREHENSIVE METABOLIC PANEL
CO2: 24 mEq/L (ref 19–32)
Calcium: 10.1 mg/dL (ref 8.4–10.5)
Creatinine, Ser: 0.69 mg/dL (ref 0.50–1.10)
GFR calc Af Amer: >90 mL/min (ref >90)
GFR calc non Af Amer: >90 mL/min (ref >90)
Glucose, Bld: 107 mg/dL — ABNORMAL HIGH (ref 70–99)
Total Protein: 7.3 g/dL (ref 6.0–8.3)

## 2013-06-26 LAB — LIPASE, BLOOD: Lipase: 22 U/L (ref 11–59)

## 2013-06-26 MED ORDER — MORPHINE SULFATE 4 MG/ML IJ SOLN
4.0000 mg | Freq: Once | INTRAMUSCULAR | Status: AC
  Administered 2013-06-26: 4 mg via INTRAVENOUS
  Filled 2013-06-26: qty 1

## 2013-06-26 MED ORDER — IOHEXOL 300 MG/ML  SOLN
50.0000 mL | Freq: Once | INTRAMUSCULAR | Status: AC | PRN
  Administered 2013-06-26: 50 mL via ORAL

## 2013-06-26 MED ORDER — SODIUM CHLORIDE 0.9 % IV BOLUS (SEPSIS)
1000.0000 mL | Freq: Once | INTRAVENOUS | Status: AC
  Administered 2013-06-26: 1000 mL via INTRAVENOUS

## 2013-06-26 MED ORDER — IOHEXOL 300 MG/ML  SOLN
100.0000 mL | Freq: Once | INTRAMUSCULAR | Status: AC | PRN
  Administered 2013-06-26: 100 mL via INTRAVENOUS

## 2013-06-26 MED ORDER — HYDROCODONE-ACETAMINOPHEN 10-325 MG PO TABS: 1.0000 | ORAL_TABLET | Freq: Four times a day (QID) | ORAL | Status: DC | PRN

## 2013-06-26 NOTE — ED Notes (Signed)
Patient transported to CT 

## 2013-06-26 NOTE — ED Provider Notes (Signed)
I saw and evaluated the patient, reviewed the resident's note and I agree with the findings and plan.   .Face to face Exam:  General:  Awake HEENT:  Atraumatic Resp:  Normal effort Abd:  Nondistended Neuro:No focal weakness    Nelia Shi, MD 06/26/13 1134

## 2013-06-26 NOTE — ED Provider Notes (Signed)
CSN: [id number]     Arrival date & time 06/26/13  0815 History   First MD Initiated Contact with Patient 06/26/13 0820     Chief Complaint  Patient presents with  . Abdominal Pain  . Nausea   (Consider location/radiation/quality/duration/timing/severity/associated sxs/prior Treatment) HPI Mandy Sellers is a 52 y.o. female w/ PMHx of HTN, h/o symptomatic AV block, now w/ PPM (placed in 2010), depression, presents to the ED w/ complaints of abdominal pain. She claims this pain started a few days ago, located in the RLQ, sharp in nature, 8/10 in severity, and is now radiating into her right flank. She claims nothing has helped the pain and it is worsened by movement and tender to palpation, not associated with food. She also claims to have associated nausea and decreased appetite, but denies vomiting, diarrhea, dysuria, hematuria, melena, or hematochezia. Patient denies any previous history of cholecystectomy, appendectomy, or other previous abdominal surgeries. Last bowel movement this AM. No history of frequent NSAID use, previous colonoscopy in 2012 showed no abnormalities.  Past Medical History  Diagnosis Date  . Hypertension   . Depression    No past surgical history on file. Family History  Problem Relation Age of Onset  . Family history unknown: Yes   History  Substance Use Topics  . Smoking status: Heavy Tobacco Smoker -- 1.50 packs/day    Types: Cigarettes  . Smokeless tobacco: Not on file  . Alcohol Use: Yes     Comment: social   OB History   Grav Para Term Preterm Abortions TAB SAB Ect Mult Living                 Review of Systems General: Positive for decreased appetite and chills. Denies fever, diaphoresis and fatigue.  Respiratory: Denies SOB, DOE, cough, chest tightness, and wheezing.   Cardiovascular: Denies chest pain, palpitations and leg swelling.  Gastrointestinal: Positive for nausea, abdominal pain. Denies vomiting, diarrhea, constipation, blood in  stool and abdominal distention.  Genitourinary: Positive for right flank pain. Denies dysuria, urgency, frequency, hematuria, and difficulty urinating.  Endocrine: Denies hot or cold intolerance, sweats, polyuria, polydipsia. Musculoskeletal: Denies myalgias, back pain, joint swelling, arthralgias and gait problem.  Skin: Denies pallor, rash and wounds.  Neurological: Denies dizziness, seizures, syncope, weakness, lightheadedness, numbness and headaches.  Psychiatric/Behavioral: Denies mood changes, confusion, nervousness, sleep disturbance and agitation.  Allergies  Review of patient's allergies indicates no known allergies.  Home Medications   Current Outpatient Rx  Name  Route  Sig  Dispense  Refill  . azithromycin (ZITHROMAX) 250 MG tablet   Oral   Take 1 tablet (250 mg total) by mouth daily. Take first 2 tablets together, then 1 every day until finished.   6 tablet   0   . BAYER ASPIRIN PO   Oral   Take by mouth.         . clonazePAM (KLONOPIN) 1 MG tablet   Oral   Take 1 mg by mouth 2 (two) times daily as needed.         . diphenoxylate-atropine (LOMOTIL) 2.5-0.025 MG per tablet   Oral   Take 1 tablet by mouth 4 (four) times daily as needed for diarrhea or loose stools.   16 tablet   0   . HYDROCHLOROTHIAZIDE PO   Oral   Take by mouth.         . Nebivolol HCl (BYSTOLIC PO)   Oral   Take by mouth.         Marland Kitchen  ondansetron (ZOFRAN ODT) 8 MG disintegrating tablet   Oral   Take 1 tablet (8 mg total) by mouth every 8 (eight) hours as needed for nausea.   20 tablet   0   . venlafaxine (EFFEXOR) 75 MG tablet   Oral   Take 75 mg by mouth 2 (two) times daily.          Physical Exam Filed Vitals:   06/26/13 0827  BP: 170/95  Pulse: 67  Temp: 97.6 F (36.4 C)  TempSrc: Oral  Resp: 18  Height: 5' 6.5" (1.689 m)  Weight: 208 lb (94.348 kg)  SpO2: 95%  General: Vital signs reviewed.  Patient is a well-developed and well-nourished, in no acute distress  and cooperative with exam. Alert and oriented x3.  Head: Normocephalic and atraumatic. Eyes: PERRL, EOMI, conjunctivae normal, No scleral icterus.  Neck: Supple, trachea midline, normal ROM, No JVD, masses, thyromegaly, or carotid bruit present.  Cardiovascular: RRR, S1 normal, S2 normal, no murmurs, gallops, or rubs. Pulmonary/Chest: Normal respiratory effort, CTAB, no wheezes, rales, or rhonchi. Abdominal: Soft, tender to palpation in RLQ, non-distended, bowel sounds are normal, no masses, organomegaly, or guarding present. Rebound tenderness on exam.  Musculoskeletal: No joint deformities, erythema, or stiffness, ROM full and no nontender. Extremities: Trace pitting edema,  pulses symmetric and intact bilaterally. No cyanosis or clubbing. Neurological: A&O x3, Strength is normal and symmetric bilaterally, cranial nerve II-XII are grossly intact, no focal motor deficit, sensory intact to light touch bilaterally.  Skin: Warm, dry and intact. No rashes or erythema. Psychiatric: Normal mood and affect. speech and behavior is normal. Cognition and memory are normal.   ED Course  Procedures (including critical care time) Labs Review Labs Reviewed - No data to display Imaging Review No results found.  EKG Interpretation   None      MDM   Mandy Sellers is a 52 y.o. female w/ PMHx of HTN, h/o symptomatic AV block, now w/ PPM (placed in 2010), depression, presents to the ED w/ complaints of RLQ pain radiating into the right flank, nausea, and decreased appetite. Tenderness to palpation, w/ rebound tenderness. Obvious concern for acute appendicitis. Also consider cholecystitis (mild RUQ pain), however, pain not associated with food. Also consider pyelonephritis and kidney stone, however, patient denies urinary symptoms. -CT abdomen/pelvis shows no acute findings in the abdomen or pelvis. Liver, gallbladder, spleen, pancreas, adrenals and kidneys are normal. Appendix is visualized and is  normal. Uterus, adnexae and urinary bladder normal. -CBC w/ no leukocytosis, 10.3. Wnl. -CMET w/ very mild elevation of alk phos (122) and AST/ALT (42/59) Previous LFT's showed mild AST/ALT elevation as well (in 2010). Otherwise wnl. -Lipase wnl (22) -Given Morphine 4 mg x2 -NS 1L bolus.  Discussed findings with patient, clinically stable for discharge at this time. Will give Vicodin 10-325 #10 q6h prn for pain to take home and will have patient follow up with PCP in 1-2 weeks. Encouraged patient to return to ED if pain and symptoms worsen. Will have patient repeat LFT's in near future.    Courtney Paris, MD 06/26/13 1120

## 2013-06-26 NOTE — ED Notes (Signed)
Patient reports that she began having right lower abdominal pain 3 days ago and last night the pain was worse and radiated to the right lowe back. Patient also c/o waves of nausea and urinary frequenc. Patient denies vomiting, fever, or diarrhea.

## 2013-08-04 ENCOUNTER — Encounter: Payer: Self-pay | Admitting: *Deleted

## 2013-08-09 ENCOUNTER — Encounter: Payer: Self-pay | Admitting: Cardiovascular Disease

## 2013-08-09 ENCOUNTER — Ambulatory Visit (INDEPENDENT_AMBULATORY_CARE_PROVIDER_SITE_OTHER): Payer: 59 | Admitting: Cardiovascular Disease

## 2013-08-09 VITALS — BP 130/80 | HR 80 | Resp 16 | Ht 66.75 in | Wt 213.2 lb

## 2013-08-09 DIAGNOSIS — I442 Atrioventricular block, complete: Secondary | ICD-10-CM | POA: Insufficient documentation

## 2013-08-09 DIAGNOSIS — Z950 Presence of cardiac pacemaker: Secondary | ICD-10-CM | POA: Insufficient documentation

## 2013-08-09 DIAGNOSIS — Z720 Tobacco use: Secondary | ICD-10-CM | POA: Insufficient documentation

## 2013-08-09 DIAGNOSIS — E669 Obesity, unspecified: Secondary | ICD-10-CM

## 2013-08-09 DIAGNOSIS — E66811 Obesity, class 1: Secondary | ICD-10-CM

## 2013-08-09 DIAGNOSIS — I10 Essential (primary) hypertension: Secondary | ICD-10-CM | POA: Insufficient documentation

## 2013-08-09 DIAGNOSIS — F17200 Nicotine dependence, unspecified, uncomplicated: Secondary | ICD-10-CM

## 2013-08-09 HISTORY — DX: Obesity, unspecified: E66.9

## 2013-08-09 HISTORY — DX: Obesity, class 1: E66.811

## 2013-08-09 HISTORY — DX: Atrioventricular block, complete: I44.2

## 2013-08-09 HISTORY — DX: Essential (primary) hypertension: I10

## 2013-08-09 LAB — MDC_IDC_ENUM_SESS_TYPE_INCLINIC
Battery Voltage: 2.8 V
Brady Statistic AP VS Percent: 0 %
Brady Statistic AS VP Percent: 99.5 %
Brady Statistic AS VS Percent: 0.1 % — CL
Lead Channel Impedance Value: 502 Ohm
Lead Channel Impedance Value: 588 Ohm
Lead Channel Pacing Threshold Amplitude: 0.5 V
Lead Channel Pacing Threshold Pulse Width: 0.4 ms
Lead Channel Sensing Intrinsic Amplitude: 4 mV
Lead Channel Setting Pacing Amplitude: 1.5 V
MDC IDC MSMT BATTERY IMPEDANCE: 204 Ohm
MDC IDC MSMT LEADCHNL RV PACING THRESHOLD AMPLITUDE: 0.5 V
MDC IDC MSMT LEADCHNL RV PACING THRESHOLD PULSEWIDTH: 0.4 ms
MDC IDC SET LEADCHNL RV PACING AMPLITUDE: 2 V
MDC IDC SET LEADCHNL RV PACING PULSEWIDTH: 0.4 ms
MDC IDC SET LEADCHNL RV SENSING SENSITIVITY: 4 mV
MDC IDC STAT BRADY AP VP PERCENT: 0.5 %

## 2013-08-09 LAB — PACEMAKER DEVICE OBSERVATION

## 2013-08-09 NOTE — Progress Notes (Signed)
Patient ID: Mandy Sellers, female   DOB: 03-01-1961, 53 y.o.   MRN: [id number]      Reason for office visit Pacemaker followup  Mandy Sellers is a 53 year old woman with premature onset cardiac conduction disease who has complete heart block. She has a very slow ventricular escape rhythm at around 35 beats per minute. She should be considered pacemaker dependent. Her dual chamber Medtronic pacemaker was implanted in 2010 and is functioning normally. She has been performing remote [employer] pacemaker followup and is here for her yearly check. She has had no problems since last year and denies issues of syncope or palpitations. She continues to smoke and despite our discussions is still not ready to commit to smoking cessation. She is mildly obese with a BMI in excess of 33. She has treated hypertension.   No Known Allergies  Current Outpatient Prescriptions  Medication Sig Dispense Refill  . BAYER ASPIRIN PO Take 81 mg by mouth every evening.       . Cholecalciferol (VITAMIN D-3) 1000 UNITS CAPS Take 1,000 Units by mouth daily.      . clonazePAM (KLONOPIN) 1 MG tablet Take 1 mg by mouth 2 (two) times daily as needed (for restless leg syndrome).       Marland Kitchen HYDROCHLOROTHIAZIDE PO Take 12.5 mg by mouth every morning.       . Multiple Vitamin (MULTIVITAMIN WITH MINERALS) TABS tablet Take 1 tablet by mouth daily.      . Nebivolol HCl (BYSTOLIC PO) Take 10 mg by mouth every evening.       . Omega-3 Fatty Acids (FISH OIL) 1000 MG CAPS Take 2,000 mg by mouth daily.      Marland Kitchen venlafaxine (EFFEXOR) 75 MG tablet Take 75 mg by mouth every evening.        No current facility-administered medications for this visit.    Past Medical History  Diagnosis Date  . Hypertension   . Depression   . Pacemaker     dual-chmber for CHB in 2010  . Tobacco abuse   . CHB (complete heart block) 08/09/2013    Past Surgical History  Procedure Laterality Date  . Pacemaker insertion  03/30/2009    for CHB; Medtronic  Adapta ADDRL1 - Dr. Norlene Duel  . Incontinence surgery  03/2000  . Tubal ligation  03/2000  . Transthoracic echocardiogram  09/2010    EF=>55%; mild MR; trace pulm valve regurg   . Nm myocar perf wall motion  05/2009    bruce myoview; normal pattern of perfusion in all regions, low risk scan    Family History  Problem Relation Age of Onset  . Diabetes Mother   . Diabetes Father   . Alcohol abuse Father   . Hypertension Father   . Stroke Father   . Deep vein thrombosis Brother     History   Social History  . Marital Status: Married    Spouse Name: N/A    Number of Children: 1  . Years of Education: master's   Occupational History  .  [place] History Main Topics  . Smoking status: Heavy Tobacco Smoker -- 1.50 packs/day for 30 years    Types: Cigarettes  . Smokeless tobacco: Never Used  . Alcohol Use: Yes     Comment: social  . Drug Use: No  . Sexual Activity: Yes   Other Topics Concern  . Not on file   Social History Narrative  . No narrative on file  Review of systems: The patient specifically denies any chest pain at rest or with exertion, dyspnea at rest or with exertion, orthopnea, paroxysmal nocturnal dyspnea, syncope, palpitations, focal neurological deficits, intermittent claudication, lower extremity edema, unexplained weight gain, cough, hemoptysis or wheezing.  The patient also denies abdominal pain, nausea, vomiting, dysphagia, diarrhea, constipation, polyuria, polydipsia, dysuria, hematuria, frequency, urgency, abnormal bleeding or bruising, fever, chills, unexpected weight changes, mood swings, change in skin or hair texture, change in voice quality, auditory or visual problems, allergic reactions or rashes, new musculoskeletal complaints other than usual "aches and pains".   PHYSICAL EXAM BP 130/80  Pulse 80  Resp 16  Ht 5' 6.75" (1.695 m)  Wt 96.707 kg (213 lb 3.2 oz)  BMI 33.66 kg/m2  General: Alert, oriented x3, no  distress Head: no evidence of trauma, PERRL, EOMI, no exophtalmos or lid lag, no myxedema, no xanthelasma; normal ears, nose and oropharynx Neck: normal jugular venous pulsations and no hepatojugular reflux; brisk carotid pulses without delay and no carotid bruits Chest: clear to auscultation, no signs of consolidation by percussion or palpation, normal fremitus, symmetrical and full respiratory excursions; healthy left subclavian pacemaker site Cardiovascular: normal position and quality of the apical impulse, regular rhythm, normal first and second heart sounds, no murmurs, rubs or gallops Abdomen: no tenderness or distention, no masses by palpation, no abnormal pulsatility or arterial bruits, normal bowel sounds, no hepatosplenomegaly Extremities: no clubbing, cyanosis or edema; 2+ radial, ulnar and brachial pulses bilaterally; 2+ right femoral, posterior tibial and dorsalis pedis pulses; 2+ left femoral, posterior tibial and dorsalis pedis pulses; no subclavian or femoral bruits Neurological: grossly nonfocal   EKG: Atrial sensed ventricular paced  Lipid Panel     Component Value Date/Time   CHOL  Value: 144        ATP III CLASSIFICATION:  <200     mg/dL   Desirable  200-239  mg/dL   Borderline High  >=240    mg/dL   High        03/29/2009 0730   TRIG 123 03/29/2009 0730   HDL 50 03/29/2009 0730   CHOLHDL 2.9 03/29/2009 0730   VLDL 25 03/29/2009 0730   LDLCALC  Value: 69        Total Cholesterol/HDL:CHD Risk Coronary Heart Disease Risk Table                     Men   Women  1/2 Average Risk   3.4   3.3  Average Risk       5.0   4.4  2 X Average Risk   9.6   7.1  3 X Average Risk  23.4   11.0        Use the calculated Patient Ratio above and the CHD Risk Table to determine the patient's CHD Risk.        ATP III CLASSIFICATION (LDL):  <100     mg/dL   Optimal  100-129  mg/dL   Near or Above                    Optimal  130-159  mg/dL   Borderline  160-189  mg/dL   High  >190     mg/dL   Very High  03/29/2009 0730    BMET    Component Value Date/Time   NA 137 06/26/2013 0852   K 4.1 06/26/2013 0852   CL 102 06/26/2013 0852   CO2 24 06/26/2013 6644  GLUCOSE 107* 06/26/2013 0852   BUN 11 06/26/2013 0852   CREATININE 0.69 06/26/2013 0852   CALCIUM 10.1 06/26/2013 0852   GFRNONAA >90 06/26/2013 0852   GFRAA >90 06/26/2013 0852     ASSESSMENT AND PLAN   Normal pacemaker check in the office today. No meaningful tachyarrhythmia has been recorded. She has 100% ventricular pacing but does not routinely pace the atrium, having normal sinus rhythm with normal chronotropic function. She'll continue with quarterly downloads of her pacemaker and yearly followup in the office. We again reviewed the importance consequences of smoking and the need for smoking cessation. She is "not ready to get her head around it". She is also overweight and therefore at risk for developing complications of obesity. She is more open to discussing methods of pallor or restriction exercise to improve her weight. Despite obesity her metabolic profile is quite good.  She is reminded that she is pacemaker dependent and needs to avoid strong magnetic fields that could interfere with normal pacemaker function.   Orders Placed This Encounter  Procedures  . EKG 12-Lead     Brandon Wiechman  Sanda Klein, MD, [facility] [phone] office [phone] pager

## 2013-08-09 NOTE — Patient Instructions (Signed)
Remote monitoring is used to monitor your pacemaker from home. This monitoring reduces the number of office visits required to check your device to one time per year. It allows [place] to keep an eye on the functioning of your device to ensure it is working properly. You are scheduled for a device check from home on 11-10-2013. You may send your transmission at any time that day. If you have a wireless device, the transmission will be sent automatically. After your physician reviews your transmission, you will receive a postcard with your next transmission date.   Your physician recommends that you schedule a follow-up appointment in: 12 months with Dr.Croitoru

## 2013-09-14 ENCOUNTER — Other Ambulatory Visit: Payer: Self-pay | Admitting: Obstetrics and Gynecology

## 2013-10-17 ENCOUNTER — Other Ambulatory Visit: Payer: Self-pay | Admitting: Cardiovascular Disease

## 2013-10-17 NOTE — Telephone Encounter (Signed)
Rx was sent to pharmacy electronically. 

## 2013-10-27 ENCOUNTER — Ambulatory Visit (HOSPITAL_BASED_OUTPATIENT_CLINIC_OR_DEPARTMENT_OTHER): Admission: RE | Admit: 2013-10-27 | Payer: 59 | Source: Ambulatory Visit | Admitting: Obstetrics and Gynecology

## 2013-10-27 ENCOUNTER — Encounter (HOSPITAL_BASED_OUTPATIENT_CLINIC_OR_DEPARTMENT_OTHER): Admission: RE | Payer: Self-pay | Source: Ambulatory Visit

## 2013-10-27 SURGERY — DILATATION & CURETTAGE/HYSTEROSCOPY WITH RESECTOCOPE
Anesthesia: Choice

## 2013-10-31 ENCOUNTER — Encounter (HOSPITAL_COMMUNITY): Payer: Self-pay | Admitting: Pharmacist

## 2013-11-07 ENCOUNTER — Encounter (HOSPITAL_COMMUNITY): Payer: Self-pay

## 2013-11-07 ENCOUNTER — Encounter (HOSPITAL_COMMUNITY)
Admission: RE | Admit: 2013-11-07 | Discharge: 2013-11-07 | Disposition: A | Discharge status: Home / Self Care | Payer: 59 | Source: Ambulatory Visit | Attending: Obstetrics and Gynecology | Admitting: Obstetrics and Gynecology

## 2013-11-07 LAB — BASIC METABOLIC PANEL
BUN: 17 mg/dL (ref 6–23)
CHLORIDE: 104 meq/L (ref 96–112)
CO2: 26 mEq/L (ref 19–32)
Calcium: 9.6 mg/dL (ref 8.4–10.5)
Creatinine, Ser: 0.65 mg/dL (ref 0.50–1.10)
GFR calc non Af Amer: >90 mL/min (ref >90)
Glucose, Bld: 93 mg/dL (ref 70–99)
Potassium: 4.2 mEq/L (ref 3.7–5.3)
SODIUM: 142 meq/L (ref 137–147)

## 2013-11-07 LAB — CBC
HEMATOCRIT: 39.3 % (ref 36.0–46.0)
HEMOGLOBIN: 13.5 g/dL (ref 12.0–15.0)
MCH: 30.8 pg (ref 26.0–34.0)
MCHC: 34.4 g/dL (ref 30.0–36.0)
MCV: 89.5 fL (ref 78.0–100.0)
Platelets: 177 10*3/uL (ref 150–400)
RBC: 4.39 MIL/uL (ref 3.87–5.11)
RDW: 13.5 % (ref 11.5–15.5)
WBC: 7.5 10*3/uL (ref 4.0–10.5)

## 2013-11-07 NOTE — Pre-Procedure Instructions (Signed)
Cardiac info. Reviewed by Dr Royce Macadamia, no orders given.

## 2013-11-07 NOTE — Patient Instructions (Signed)
[place]  11/07/2013   Your procedure is scheduled on:  11/10/13  Enter through the Main Entrance of [facility] at [place] up the phone at the desk and dial [phone].   Call this number if you have problems the morning of surgery: [phone]   Remember:   Do not eat food:After Midnight.  Do not drink clear liquids: After Midnight.  Take these medicines the morning of surgery with A SIP OF WATER: NA   Do not wear jewelry, make-up or nail polish.  Do not wear lotions, powders, or perfumes. You may wear deodorant.  Do not shave 48 hours prior to surgery.  Do not bring valuables to the hospital.  [facility] is not   responsible for any belongings or valuables brought to the hospital.  Contacts, dentures or bridgework may not be worn into surgery.  Leave suitcase in the car. After surgery it may be brought to your room.  For patients admitted to the hospital, checkout time is 11:00 AM the day of              discharge.   Patients discharged the day of surgery will not be allowed to drive             home.  Name and phone number of your driver: daughter  Rachael Fee  Special Instructions:      Please read over the following fact sheets that you were given:   Surgical Site Infection Prevention

## 2013-11-08 NOTE — H&P (Signed)
  Patient name  iline, buchinger DICTATION#  [id number] CSN# [id number]  Darlyn Chamber, MD 11/08/2013 6:38 AM

## 2013-11-08 NOTE — H&P (Signed)
Mandy Sellers, Mandy Sellers NO.:  [id number]  MEDICAL RECORD NO.:  [id number]  LOCATION:  [facility]                           FACILITY:  [facility]  PHYSICIAN:  Darlyn Chamber, M.D.   DATE OF BIRTH:  [birth year]  DATE OF ADMISSION:  11/07/2013 DATE OF DISCHARGE:  11/07/2013                             HISTORY & PHYSICAL   Date of surgery is Nov 10, 2013, at [facility]:  The patient is a 53 year old, gravida 3, para 3, perimenopausal patient who presents for hysteroscopic evaluation along with dilation and curettage.  The patient had a CT suggestive of diverticulosis that noticed a markedly thickened endometrium.  She has had a past history of hysteroscopic resection of benign endometrial polyp.  She underwent a saline infusion ultrasound that revealed the endometrium was significantly thickened.  Endometrial sampling revealed an endometrial polyp, but otherwise was benign with proliferative endometrium.  No hyperplasia was noted.  In view of the thickness in the polyp, she now presents for repeat hysteroscopy with D and C.  ALLERGIES:  In terms of allergies, no known drug allergies.  MEDICATIONS: 1. Bystolic 10 mg daily. 2. Hydrochlorothiazide 12.5 mg daily. 3. Effexor 75 mg daily. 4. Klonopin 1 mg as needed.  PAST MEDICAL HISTORY:  She has a history of hypertension under active management.  Has a history of diverticulosis as well as irritable bowel syndrome.  Has a history of asthmatic bronchitis.  PAST SURGICAL HISTORY:  In terms of past surgical history, she has had a previous retropubic suspension of the bladder and previous bilateral tubal ligation.  SOCIAL HISTORY:  In terms of social history, she has 1-pack per day tobacco use and some occasional alcohol abuse.  FAMILY HISTORY:  Basically noncontributory.  REVIEW OF SYSTEMS:  Noncontributory.  PHYSICAL EXAMINATION:  VITAL SIGNS:  The patient is afebrile with stable vital  signs. HEENT:  The patient is normocephalic.  Pupils are equal, round, and reactive to light and accommodation.  Extraocular movements are intact. Sclerae and conjunctivae are clear.  Oropharynx clear. NECK:  Without thyromegaly. BREASTS:  Not examined. LUNGS:  Clear. CARDIOVASCULAR:  Regular rhythm and rate without murmurs or gallops.  No carotid or abdominal bruits. ABDOMEN:  Benign.  No mass, organomegaly, or tenderness. PELVIC:  Normal external genitalia.  Vaginal mucosa is clear.  Cervix unremarkable.  Uterus; normal size, shape, and contour.  Adnexa, free of masses or tenderness. EXTREMITIES:  Trace edema. NEUROLOGIC:  Grossly within normal limits.  IMPRESSION: 1. Endometrial thickening with endometrial polyps in perimenopausal     patient. 2. Hypertension. 3. Asthmatic bronchitis. 4. Diverticulitis.  PLAN:  The patient undergo hysteroscopic evaluation, dilation and curettage.  The nature of the procedure has discussed.  The risks have been explained including the risk of infection.  The risk of hemorrhage that could require transfusion with the risk of AIDS or hepatitis. Excessive bleeding could require hysterectomy.  There is a risk of uterine perforation with injury to adjacent organs such as bowel, this could require diagnostic laparoscopy and exploratory surgery for repair, risk of deep venous thrombosis and pulmonary embolus.  The patient expressed understanding of potential risks and complications.  Darlyn Chamber, M.D.     JSM/MEDQ  D:  11/08/2013  T:  11/08/2013  Job:  [id number]

## 2013-11-10 ENCOUNTER — Ambulatory Visit (HOSPITAL_COMMUNITY)
Admission: RE | Admit: 2013-11-10 | Discharge: 2013-11-10 | Disposition: A | Discharge status: Home / Self Care | Payer: 59 | Source: Ambulatory Visit | Attending: Obstetrics and Gynecology | Admitting: Obstetrics and Gynecology

## 2013-11-10 ENCOUNTER — Encounter (HOSPITAL_COMMUNITY)
Admission: RE | Disposition: A | Discharge status: Home / Self Care | Payer: Self-pay | Source: Ambulatory Visit | Attending: Obstetrics and Gynecology

## 2013-11-10 ENCOUNTER — Encounter: Payer: 59 | Admitting: *Deleted

## 2013-11-10 ENCOUNTER — Encounter (HOSPITAL_COMMUNITY): Payer: Self-pay | Admitting: *Deleted

## 2013-11-10 ENCOUNTER — Ambulatory Visit (HOSPITAL_COMMUNITY): Payer: 59 | Admitting: Anesthesiology

## 2013-11-10 ENCOUNTER — Encounter (HOSPITAL_COMMUNITY): Payer: 59 | Admitting: Anesthesiology

## 2013-11-10 DIAGNOSIS — N8000 Endometriosis of the uterus, unspecified: Secondary | ICD-10-CM | POA: Insufficient documentation

## 2013-11-10 DIAGNOSIS — Z9889 Other specified postprocedural states: Secondary | ICD-10-CM

## 2013-11-10 DIAGNOSIS — I10 Essential (primary) hypertension: Secondary | ICD-10-CM | POA: Insufficient documentation

## 2013-11-10 DIAGNOSIS — F17200 Nicotine dependence, unspecified, uncomplicated: Secondary | ICD-10-CM | POA: Insufficient documentation

## 2013-11-10 DIAGNOSIS — N840 Polyp of corpus uteri: Secondary | ICD-10-CM | POA: Insufficient documentation

## 2013-11-10 DIAGNOSIS — Z950 Presence of cardiac pacemaker: Secondary | ICD-10-CM | POA: Insufficient documentation

## 2013-11-10 DIAGNOSIS — N950 Postmenopausal bleeding: Secondary | ICD-10-CM | POA: Insufficient documentation

## 2013-11-10 DIAGNOSIS — R9389 Abnormal findings on diagnostic imaging of other specified body structures: Secondary | ICD-10-CM | POA: Insufficient documentation

## 2013-11-10 HISTORY — PX: DILATATION & CURRETTAGE/HYSTEROSCOPY WITH RESECTOCOPE: SHX5572

## 2013-11-10 SURGERY — DILATATION & CURETTAGE/HYSTEROSCOPY WITH RESECTOCOPE
Anesthesia: General

## 2013-11-10 MED ORDER — PROPOFOL 10 MG/ML IV BOLUS
INTRAVENOUS | Status: DC | PRN
  Administered 2013-11-10: 180 mg via INTRAVENOUS

## 2013-11-10 MED ORDER — ONDANSETRON HCL 4 MG/2ML IJ SOLN
INTRAMUSCULAR | Status: DC | PRN
  Administered 2013-11-10: 4 mg via INTRAVENOUS

## 2013-11-10 MED ORDER — LIDOCAINE-EPINEPHRINE 1 %-1:100000 IJ SOLN
INTRAMUSCULAR | Status: AC
  Filled 2013-11-10: qty 1

## 2013-11-10 MED ORDER — FENTANYL CITRATE 0.05 MG/ML IJ SOLN
INTRAMUSCULAR | Status: AC
  Filled 2013-11-10: qty 2

## 2013-11-10 MED ORDER — OXYCODONE-ACETAMINOPHEN 7.5-325 MG PO TABS: 1.0000 | ORAL_TABLET | ORAL | Status: DC | PRN

## 2013-11-10 MED ORDER — LIDOCAINE-EPINEPHRINE (PF) 1 %-1:200000 IJ SOLN: INTRAMUSCULAR | Status: DC | PRN

## 2013-11-10 MED ORDER — MEPERIDINE HCL 25 MG/ML IJ SOLN: 6.2500 mg | INTRAMUSCULAR | Status: DC | PRN

## 2013-11-10 MED ORDER — MIDAZOLAM HCL 2 MG/2ML IJ SOLN
INTRAMUSCULAR | Status: AC
  Filled 2013-11-10: qty 2

## 2013-11-10 MED ORDER — LACTATED RINGERS IV SOLN
INTRAVENOUS | Status: DC
  Administered 2013-11-10 (×2): via INTRAVENOUS

## 2013-11-10 MED ORDER — CEFAZOLIN SODIUM-DEXTROSE 2-3 GM-% IV SOLR
INTRAVENOUS | Status: AC
  Administered 2013-11-10: 2 g via INTRAVENOUS
  Filled 2013-11-10: qty 50

## 2013-11-10 MED ORDER — LIDOCAINE HCL 1 % IJ SOLN
INTRAMUSCULAR | Status: DC | PRN
  Administered 2013-11-10: 18 mL

## 2013-11-10 MED ORDER — LACTATED RINGERS IV SOLN: INTRAVENOUS | Status: DC

## 2013-11-10 MED ORDER — CEFAZOLIN SODIUM-DEXTROSE 2-3 GM-% IV SOLR: 2.0000 g | INTRAVENOUS | Status: DC

## 2013-11-10 MED ORDER — LIDOCAINE HCL (CARDIAC) 20 MG/ML IV SOLN
INTRAVENOUS | Status: DC | PRN
  Administered 2013-11-10: 70 mg via INTRAVENOUS
  Administered 2013-11-10: 30 mg via INTRAVENOUS

## 2013-11-10 MED ORDER — SODIUM CHLORIDE 0.9 % IR SOLN
Status: DC | PRN
  Administered 2013-11-10: 1

## 2013-11-10 MED ORDER — LIDOCAINE HCL 1 % IJ SOLN
INTRAMUSCULAR | Status: AC
  Filled 2013-11-10: qty 20

## 2013-11-10 MED ORDER — FENTANYL CITRATE 0.05 MG/ML IJ SOLN
INTRAMUSCULAR | Status: DC | PRN
  Administered 2013-11-10 (×2): 50 ug via INTRAVENOUS

## 2013-11-10 MED ORDER — FENTANYL CITRATE 0.05 MG/ML IJ SOLN
25.0000 ug | INTRAMUSCULAR | Status: DC | PRN
  Administered 2013-11-10: 50 ug via INTRAVENOUS

## 2013-11-10 MED ORDER — KETOROLAC TROMETHAMINE 30 MG/ML IJ SOLN
INTRAMUSCULAR | Status: AC
  Filled 2013-11-10: qty 1

## 2013-11-10 MED ORDER — MIDAZOLAM HCL 2 MG/2ML IJ SOLN
INTRAMUSCULAR | Status: DC | PRN
  Administered 2013-11-10: 1 mg via INTRAVENOUS

## 2013-11-10 MED ORDER — LIDOCAINE HCL (CARDIAC) 20 MG/ML IV SOLN
INTRAVENOUS | Status: AC
  Filled 2013-11-10: qty 5

## 2013-11-10 MED ORDER — KETOROLAC TROMETHAMINE 30 MG/ML IJ SOLN
INTRAMUSCULAR | Status: DC | PRN
  Administered 2013-11-10: 30 mg via INTRAVENOUS

## 2013-11-10 MED ORDER — ONDANSETRON HCL 4 MG/2ML IJ SOLN
INTRAMUSCULAR | Status: AC
  Filled 2013-11-10: qty 2

## 2013-11-10 MED ORDER — PROMETHAZINE HCL 25 MG/ML IJ SOLN: 6.2500 mg | INTRAMUSCULAR | Status: DC | PRN

## 2013-11-10 SURGICAL SUPPLY — 20 items
CANISTER SUCT 3000ML (MISCELLANEOUS) ×2 IMPLANT
CATH ROBINSON RED A/P 16FR (CATHETERS) ×2 IMPLANT
CLOTH BEACON ORANGE TIMEOUT ST (SAFETY) ×2 IMPLANT
CONTAINER PREFILL 10% NBF 60ML (FORM) ×4 IMPLANT
DRAPE HYSTEROSCOPY (DRAPE) ×2 IMPLANT
DRSG TELFA 3X8 NADH (GAUZE/BANDAGES/DRESSINGS) ×2 IMPLANT
ELECT REM PT RETURN 9FT ADLT (ELECTROSURGICAL) ×2
ELECTRODE REM PT RTRN 9FT ADLT (ELECTROSURGICAL) ×1 IMPLANT
GLOVE BIO SURGEON STRL SZ7 (GLOVE) ×2 IMPLANT
GOWN STRL REUS W/TWL LRG LVL3 (GOWN DISPOSABLE) ×4 IMPLANT
LOOP ANGLED CUTTING 22FR (CUTTING LOOP) ×2 IMPLANT
NEEDLE SPNL 20GX3.5 QUINCKE YW (NEEDLE) ×2 IMPLANT
NEEDLE SPNL 22GX3.5 QUINCKE BK (NEEDLE) ×2 IMPLANT
PACK VAGINAL MINOR WOMEN LF (CUSTOM PROCEDURE TRAY) ×2 IMPLANT
PAD OB MATERNITY 4.3X12.25 (PERSONAL CARE ITEMS) ×2 IMPLANT
SET TUBING HYSTEROSCOPY 2 NDL (TUBING) ×2 IMPLANT
SYR CONTROL 10ML LL (SYRINGE) ×2 IMPLANT
TOWEL OR 17X24 6PK STRL BLUE (TOWEL DISPOSABLE) ×4 IMPLANT
TUBE HYSTEROSCOPY W Y-CONNECT (TUBING) ×2 IMPLANT
WATER STERILE IRR 1000ML POUR (IV SOLUTION) ×2 IMPLANT

## 2013-11-10 NOTE — H&P (Signed)
  History and physical exam unchanged 

## 2013-11-10 NOTE — Anesthesia Postprocedure Evaluation (Signed)
  Anesthesia Post-op Note  Patient: Mandy Sellers  Procedure(s) Performed: Procedure(s): DILATATION & CURETTAGE/HYSTEROSCOPY WITH MYOSURE (N/A)  Patient Location: PACU  Anesthesia Type:General  Level of Consciousness: awake, alert  and oriented  Airway and Oxygen Therapy: Patient Spontanous Breathing  Post-op Pain: none  Post-op Assessment: Post-op Vital signs reviewed, Patient's Cardiovascular Status Stable, Respiratory Function Stable, Patent Airway, No signs of Nausea or vomiting and Pain level controlled  Post-op Vital Signs: Reviewed and stable  Last Vitals:  Filed Vitals:   11/10/13 0845  BP: 135/71  Pulse: 69  Temp:   Resp: 18    Complications: No apparent anesthesia complications

## 2013-11-10 NOTE — Discharge Instructions (Signed)
DISCHARGE INSTRUCTIONS: D&C / D&E The following instructions have been prepared to help you care for yourself upon your return home.  MAY TAKE IBUPROFEN (MOTRIN, ADVIL) OR ALEVE AFTER 1:45 PM FOR CRAMPS!!   Personal hygiene:  Use sanitary pads for vaginal drainage, not tampons.  Shower the day after your procedure.  NO tub baths, pools or Jacuzzis for 2-3 weeks.  Wipe front to back after using the bathroom.  Activity and limitations:  Do NOT drive or operate any equipment for 24 hours. The effects of anesthesia are still present and drowsiness may result.  Do NOT rest in bed all day.  Walking is encouraged.  Walk up and down stairs slowly.  You may resume your normal activity in one to two days or as indicated by your physician.  Sexual activity: NO intercourse for at least 2 weeks after the procedure, or as indicated by your physician.  Diet: Eat a light meal as desired this evening. You may resume your usual diet tomorrow.  Return to work: You may resume your work activities in one to two days or as indicated by your doctor.  What to expect after your surgery: Expect to have vaginal bleeding/discharge for 2-3 days and spotting for up to 10 days. It is not unusual to have soreness for up to 1-2 weeks. You may have a slight burning sensation when you urinate for the first day. Mild cramps may continue for a couple of days. You may have a regular period in 2-6 weeks.  Call your doctor for any of the following:  Excessive vaginal bleeding, saturating and changing one pad every hour.  Inability to urinate 6 hours after discharge from hospital.  Pain not relieved by pain medication.  Fever of 100.4 F or greater.  Unusual vaginal discharge or odor.   Call for an appointment:    Patients signature: ______________________  Nurses signature ________________________  Support person's signature_______________________

## 2013-11-10 NOTE — Transfer of Care (Signed)
Immediate Anesthesia Transfer of Care Note  Patient: Mandy Sellers  Procedure(s) Performed: Procedure(s): DILATATION & CURETTAGE/HYSTEROSCOPY WITH MYOSURE (N/A)  Patient Location: PACU  Anesthesia Type:General  Level of Consciousness: awake, alert , oriented and patient cooperative  Airway & Oxygen Therapy: Patient Spontanous Breathing and Patient connected to nasal cannula oxygen  Post-op Assessment: Report given to PACU RN and Post -op Vital signs reviewed and stable  Post vital signs: Reviewed and stable  Complications: No apparent anesthesia complications

## 2013-11-10 NOTE — Brief Op Note (Signed)
11/10/2013  8:03 AM  PATIENT:  Mandy Sellers  53 y.o. female  PRE-OPERATIVE DIAGNOSIS:  polyp  POST-OPERATIVE DIAGNOSIS:  * No post-op diagnosis entered *  PROCEDURE:  Procedure(s): DILATATION & CURETTAGE/HYSTEROSCOPY WITH MYOSURE (N/A)  SURGEON:  Surgeon(s) and Role:    * Darlyn Chamber, MD - Primary  PHYSICIAN ASSISTANT:   ASSISTANTS: none   ANESTHESIA:   general and paracervical block  EBL:     BLOOD ADMINISTERED:none  DRAINS: none   LOCAL MEDICATIONS USED:  XYLOCAINE   SPECIMEN:  Source of Specimen:  endometrial resections and currettings  DISPOSITION OF SPECIMEN:  PATHOLOGY  COUNTS:  YES  TOURNIQUET:  * No tourniquets in log *  DICTATION: .Other Dictation: Dictation Number [id number]  PLAN OF CARE: Discharge to home after PACU  PATIENT DISPOSITION:  PACU - hemodynamically stable.   Delay start of Pharmacological VTE agent (>24hrs) due to surgical blood loss or risk of bleeding: not applicable

## 2013-11-10 NOTE — Anesthesia Preprocedure Evaluation (Addendum)
Anesthesia Evaluation  Patient identified by MRN, date of birth, ID band Patient awake    Reviewed: Allergy & Precautions, H&P , NPO status , Patient's Chart, lab work & pertinent test results  Airway Mallampati: II TM Distance: >3 FB Neck ROM: Full    Dental no notable dental hx.    Pulmonary Current Smoker,  breath sounds clear to auscultation  Pulmonary exam normal       Cardiovascular hypertension, Pt. on medications + pacemaker (for CHB in 2010) Rhythm:Regular Rate:Normal     Neuro/Psych negative neurological ROS  negative psych ROS   GI/Hepatic negative GI ROS, Neg liver ROS,   Endo/Other  negative endocrine ROS  Renal/GU negative Renal ROS  negative genitourinary   Musculoskeletal negative musculoskeletal ROS (+)   Abdominal   Peds negative pediatric ROS (+)  Hematology negative hematology ROS (+)   Anesthesia Other Findings   Reproductive/Obstetrics negative OB ROS                         Anesthesia Physical Anesthesia Plan  ASA: III  Anesthesia Plan: General   Post-op Pain Management:    Induction: Intravenous  Airway Management Planned: LMA  Additional Equipment:   Intra-op Plan:   Post-operative Plan:   Informed Consent: I have reviewed the patients History and Physical, chart, labs and discussed the procedure including the risks, benefits and alternatives for the proposed anesthesia with the patient or authorized representative who has indicated his/her understanding and acceptance.   Dental advisory given  Plan Discussed with: CRNA  Anesthesia Plan Comments:        Anesthesia Quick Evaluation

## 2013-11-10 NOTE — Op Note (Signed)
NAMENIOMI, VALENT NO.:  [id number]  MEDICAL RECORD NO.:  [id number]  LOCATION:  [facility]                          FACILITY:  [facility]  PHYSICIAN:  Darlyn Chamber, M.D.   DATE OF BIRTH:  1960/10/25  DATE OF PROCEDURE:  11/10/2013 DATE OF DISCHARGE:  11/10/2013                              OPERATIVE REPORT   PREOPERATIVE DIAGNOSIS:  Postmenopausal bleeding with thickened endometrium and polypoid like growths.  POSTOPERATIVE DIAGNOSIS:  Postmenopausal bleeding with thickened endometrium and polypoid like growths.  OPERATIVE PROCEDURE:  Paracervical block, cervical dilation. Hysteroscopy with a MyoSure.  Resection of the endometrium with MyoSure and endometrial curettings.  SURGEON:  Darlyn Chamber, MD  ANESTHESIA:  Paracervical block and general.  ESTIMATED BLOOD LOSS:  Minimal.  PACKS AND DRAINS:  None.  INTRAOPERATIVE BLOOD PLACED:  None.  COMPLICATIONS:  None.  INDICATION:  Dictated in history and physical.  PROCEDURE IN DETAIL:  The patient was taken to OR, placed supine position.  After satisfactory level of general anesthesia was obtained, the patient was placed in dorsal lithotomy position using Allen stirrups.  Perineum and vagina were prepped out with Betadine and draped sterile field.  A speculum was placed in vaginal vault.  Cervix was secured with a single-tooth tenaculum.  Paracervical block was instituted using 1% Xylocaine.  Uterus sounded to 9 cm.  Cervix was serially dilated to a size 33 Pratt dilator.  Hysteroscope was then introduced.  Visualization did reveal a thickened irregular endometrium. I did not see any discrete polyps, but there was marked irregularity to the lining.  We brought in a small MyoSure and we resected the endometrium, all of which was sent to Pathology.  After that the MyoSure and hysteroscope were removed.  Endometrial curettings were obtained. There was no signs of perforation or other complications.  Bleeding  was minimal.  At this point in time, patient was taken out of the dorsal lithotomy position.  Once alert and extubated, transferred to recovery room in good condition.  Sponge, instrument, and needle count was correct by circulating nurse.     Darlyn Chamber, M.D.     JSM/MEDQ  D:  11/10/2013  T:  11/10/2013  Job:  [id number]

## 2013-11-10 NOTE — Op Note (Signed)
Patient name  nataleigh, Mandy Sellers DICTATION# [id number] CSN# [id number]  Darlyn Chamber, MD 11/10/2013 8:05 AM

## 2013-11-11 ENCOUNTER — Encounter (HOSPITAL_COMMUNITY): Payer: Self-pay | Admitting: Obstetrics and Gynecology

## 2013-11-15 ENCOUNTER — Encounter: Payer: Self-pay | Admitting: Cardiovascular Disease

## 2013-11-15 ENCOUNTER — Other Ambulatory Visit: Payer: Self-pay | Admitting: Cardiovascular Disease

## 2013-11-15 ENCOUNTER — Ambulatory Visit (INDEPENDENT_AMBULATORY_CARE_PROVIDER_SITE_OTHER): Payer: 59 | Admitting: *Deleted

## 2013-11-15 DIAGNOSIS — I442 Atrioventricular block, complete: Secondary | ICD-10-CM

## 2013-11-15 LAB — MDC_IDC_ENUM_SESS_TYPE_REMOTE
Battery Impedance: 228 Ohm
Battery Remaining Longevity: 123 mo
Battery Voltage: 2.8 V
Brady Statistic AP VP Percent: 1 %
Brady Statistic AP VS Percent: 0 %
Brady Statistic AS VP Percent: 99 %
Lead Channel Impedance Value: 481 Ohm
Lead Channel Pacing Threshold Amplitude: 0.375 V
Lead Channel Pacing Threshold Pulse Width: 0.4 ms
Lead Channel Sensing Intrinsic Amplitude: 2.8 mV
Lead Channel Setting Pacing Amplitude: 1.5 V
Lead Channel Setting Pacing Amplitude: 2 V
Lead Channel Setting Pacing Pulse Width: 0.4 ms
MDC IDC MSMT LEADCHNL RA PACING THRESHOLD PULSEWIDTH: 0.4 ms
MDC IDC MSMT LEADCHNL RV IMPEDANCE VALUE: 600 Ohm
MDC IDC MSMT LEADCHNL RV PACING THRESHOLD AMPLITUDE: 0.5 V
MDC IDC SESS DTM: 20150512083847
MDC IDC SET LEADCHNL RV SENSING SENSITIVITY: 4 mV
MDC IDC STAT BRADY AS VS PERCENT: 0 %

## 2013-11-15 NOTE — Telephone Encounter (Signed)
Rx refill sent to patient pharmacy   

## 2013-11-16 ENCOUNTER — Other Ambulatory Visit: Payer: Self-pay | Admitting: Gastroenterology

## 2013-11-16 DIAGNOSIS — R1011 Right upper quadrant pain: Secondary | ICD-10-CM

## 2013-11-18 NOTE — Progress Notes (Signed)
Remote pacemaker transmission.   

## 2013-11-22 ENCOUNTER — Ambulatory Visit
Admission: RE | Admit: 2013-11-22 | Discharge: 2013-11-22 | Disposition: A | Discharge status: Home / Self Care | Payer: 59 | Source: Ambulatory Visit | Attending: Gastroenterology | Admitting: Gastroenterology

## 2013-11-22 DIAGNOSIS — R1011 Right upper quadrant pain: Secondary | ICD-10-CM

## 2013-11-24 ENCOUNTER — Other Ambulatory Visit: Payer: Self-pay | Admitting: Gastroenterology

## 2013-11-24 DIAGNOSIS — R1011 Right upper quadrant pain: Secondary | ICD-10-CM

## 2013-12-02 ENCOUNTER — Ambulatory Visit
Admission: RE | Admit: 2013-12-02 | Discharge: 2013-12-02 | Disposition: A | Discharge status: Home / Self Care | Payer: 59 | Source: Ambulatory Visit | Attending: Gastroenterology | Admitting: Gastroenterology

## 2013-12-02 DIAGNOSIS — R1011 Right upper quadrant pain: Secondary | ICD-10-CM

## 2014-02-07 ENCOUNTER — Emergency Department (HOSPITAL_COMMUNITY)
Admission: EM | Admit: 2014-02-07 | Discharge: 2014-02-08 | Disposition: A | Discharge status: Home / Self Care | Payer: 59 | Attending: Emergency Medicine | Admitting: Emergency Medicine

## 2014-02-07 ENCOUNTER — Encounter (HOSPITAL_COMMUNITY): Payer: Self-pay | Admitting: Emergency Medicine

## 2014-02-07 ENCOUNTER — Emergency Department (HOSPITAL_COMMUNITY): Payer: 59

## 2014-02-07 DIAGNOSIS — F329 Major depressive disorder, single episode, unspecified: Secondary | ICD-10-CM | POA: Insufficient documentation

## 2014-02-07 DIAGNOSIS — I10 Essential (primary) hypertension: Secondary | ICD-10-CM | POA: Diagnosis not present

## 2014-02-07 DIAGNOSIS — Z9889 Other specified postprocedural states: Secondary | ICD-10-CM | POA: Diagnosis not present

## 2014-02-07 DIAGNOSIS — E669 Obesity, unspecified: Secondary | ICD-10-CM | POA: Diagnosis not present

## 2014-02-07 DIAGNOSIS — Z79899 Other long term (current) drug therapy: Secondary | ICD-10-CM | POA: Diagnosis not present

## 2014-02-07 DIAGNOSIS — M545 Low back pain, unspecified: Secondary | ICD-10-CM | POA: Diagnosis not present

## 2014-02-07 DIAGNOSIS — F17200 Nicotine dependence, unspecified, uncomplicated: Secondary | ICD-10-CM | POA: Insufficient documentation

## 2014-02-07 DIAGNOSIS — F3289 Other specified depressive episodes: Secondary | ICD-10-CM | POA: Insufficient documentation

## 2014-02-07 DIAGNOSIS — Z950 Presence of cardiac pacemaker: Secondary | ICD-10-CM | POA: Diagnosis not present

## 2014-02-07 DIAGNOSIS — R1031 Right lower quadrant pain: Secondary | ICD-10-CM | POA: Insufficient documentation

## 2014-02-07 DIAGNOSIS — Z9851 Tubal ligation status: Secondary | ICD-10-CM | POA: Insufficient documentation

## 2014-02-07 DIAGNOSIS — Z7982 Long term (current) use of aspirin: Secondary | ICD-10-CM | POA: Diagnosis not present

## 2014-02-07 LAB — COMPREHENSIVE METABOLIC PANEL
ALBUMIN: 4 g/dL (ref 3.5–5.2)
ALK PHOS: 115 U/L (ref 39–117)
ALT: 35 U/L (ref 0–35)
AST: 29 U/L (ref 0–37)
Anion gap: 15 (ref 5–15)
BUN: 15 mg/dL (ref 6–23)
CHLORIDE: 100 meq/L (ref 96–112)
CO2: 23 mEq/L (ref 19–32)
Calcium: 10.2 mg/dL (ref 8.4–10.5)
Creatinine, Ser: 0.75 mg/dL (ref 0.50–1.10)
GFR calc Af Amer: >90 mL/min (ref >90)
GFR calc non Af Amer: >90 mL/min (ref >90)
Glucose, Bld: 100 mg/dL — ABNORMAL HIGH (ref 70–99)
POTASSIUM: 4.4 meq/L (ref 3.7–5.3)
Sodium: 138 mEq/L (ref 137–147)
Total Protein: 7.6 g/dL (ref 6.0–8.3)

## 2014-02-07 LAB — URINALYSIS, ROUTINE W REFLEX MICROSCOPIC
Bilirubin Urine: NEGATIVE
Glucose, UA: NEGATIVE mg/dL
HGB URINE DIPSTICK: NEGATIVE
Ketones, ur: NEGATIVE mg/dL
Leukocytes, UA: NEGATIVE
Nitrite: NEGATIVE
Protein, ur: NEGATIVE mg/dL
SPECIFIC GRAVITY, URINE: 1.012 (ref 1.005–1.030)
Urobilinogen, UA: 0.2 mg/dL (ref 0.0–1.0)
pH: 7 (ref 5.0–8.0)

## 2014-02-07 LAB — CBC WITH DIFFERENTIAL/PLATELET
Basophils Absolute: 0 10*3/uL (ref 0.0–0.1)
Basophils Relative: 0 % (ref 0–1)
Eosinophils Absolute: 0.1 10*3/uL (ref 0.0–0.7)
Eosinophils Relative: 1 % (ref 0–5)
HCT: 43.1 % (ref 36.0–46.0)
HEMOGLOBIN: 14.6 g/dL (ref 12.0–15.0)
LYMPHS ABS: 2.9 10*3/uL (ref 0.7–4.0)
LYMPHS PCT: 27 % (ref 12–46)
MCH: 30.6 pg (ref 26.0–34.0)
MCHC: 33.9 g/dL (ref 30.0–36.0)
MCV: 90.4 fL (ref 78.0–100.0)
MONOS PCT: 6 % (ref 3–12)
Monocytes Absolute: 0.6 10*3/uL (ref 0.1–1.0)
NEUTROS ABS: 7.1 10*3/uL (ref 1.7–7.7)
NEUTROS PCT: 66 % (ref 43–77)
Platelets: 205 10*3/uL (ref 150–400)
RBC: 4.77 MIL/uL (ref 3.87–5.11)
RDW: 12.9 % (ref 11.5–15.5)
WBC: 10.7 10*3/uL — AB (ref 4.0–10.5)

## 2014-02-07 LAB — LIPASE, BLOOD: Lipase: 36 U/L (ref 11–59)

## 2014-02-07 MED ORDER — ONDANSETRON HCL 4 MG/2ML IJ SOLN
4.0000 mg | Freq: Once | INTRAMUSCULAR | Status: AC
  Administered 2014-02-07: 4 mg via INTRAVENOUS
  Filled 2014-02-07: qty 2

## 2014-02-07 MED ORDER — IOHEXOL 300 MG/ML  SOLN
50.0000 mL | Freq: Once | INTRAMUSCULAR | Status: AC | PRN
  Administered 2014-02-07: 50 mL via ORAL

## 2014-02-07 MED ORDER — METHOCARBAMOL 1000 MG/10ML IJ SOLN
500.0000 mg | Freq: Once | INTRAMUSCULAR | Status: DC
  Filled 2014-02-07: qty 5

## 2014-02-07 MED ORDER — MORPHINE SULFATE 4 MG/ML IJ SOLN
4.0000 mg | Freq: Once | INTRAMUSCULAR | Status: AC
  Administered 2014-02-07: 4 mg via INTRAVENOUS
  Filled 2014-02-07: qty 1

## 2014-02-07 MED ORDER — DICYCLOMINE HCL 20 MG PO TABS
20.0000 mg | ORAL_TABLET | Freq: Once | ORAL | Status: AC
  Administered 2014-02-07: 20 mg via ORAL
  Filled 2014-02-07: qty 1

## 2014-02-07 MED ORDER — SODIUM CHLORIDE 0.9 % IV BOLUS (SEPSIS)
1000.0000 mL | Freq: Once | INTRAVENOUS | Status: AC
  Administered 2014-02-07: 1000 mL via INTRAVENOUS

## 2014-02-07 MED ORDER — DEXTROSE 5 % IV SOLN
500.0000 mg | Freq: Once | INTRAVENOUS | Status: AC
  Administered 2014-02-08: 500 mg via INTRAVENOUS
  Filled 2014-02-07: qty 5

## 2014-02-07 NOTE — ED Notes (Signed)
Pt states she has a sharp pain in her right lower abdomen and now it radiates into her back on the right side and feels like she is having spasms  Pt denies nausea or vomiting  Pt states the pain started around 5pm

## 2014-02-07 NOTE — ED Notes (Signed)
Urine has been collected and sent to lab.

## 2014-02-07 NOTE — ED Provider Notes (Signed)
CSN: [id number]     Arrival date & time 02/07/14  1901 History   First MD Initiated Contact with Patient 02/07/14 2251     Chief Complaint  Patient presents with  . Abdominal Pain     (Consider location/radiation/quality/duration/timing/severity/associated sxs/prior Treatment) HPI  Mandy Sellers is a(n) 53 y.o. female who presents to the ED w/ cc of R sided abdominal pain. Patient statea the pain began suddenly in the R lower abdomen and radiates to the R flank and low back. It is described as sharp and stabbing like a spasm. It seems to be worse with certain movements. It is constant with periods of severe pain. She denies any urinary sxs. Denies nausea, vomiting, diarrhea, melena, or hematochezia. The patient has a surgical hx of tubal ligation and d&C Denies fevers, chills, myalgias, arthralgias. Denies DOE, SOB, chest tightness or pressure, radiation to left arm, jaw or back, or diaphoresis. Denies dysuria, suprapubic pain, frequency, urgency, or hematuria. Denies headaches, light headedness, weakness, visual disturbances. Denies, nausea, vomiting, diarrhea or constipation.   Past Medical History  Diagnosis Date  . Hypertension   . Depression   . Pacemaker     dual-chmber for CHB in 2010  . Tobacco abuse   . CHB (complete heart block) 08/09/2013  . Obesity (BMI 30.0-34.9) 08/09/2013  . HTN (hypertension) 08/09/2013   Past Surgical History  Procedure Laterality Date  . Pacemaker insertion  03/30/2009    for CHB; [employer] Adapta ADDRL1 - Dr. Norlene Duel  . Incontinence surgery  03/2000  . Tubal ligation  03/2000  . Transthoracic echocardiogram  09/2010    EF=>55%; mild MR; trace pulm valve regurg   . Nm myocar perf wall motion  05/2009    bruce myoview; normal pattern of perfusion in all regions, low risk scan  . Insert / replace / remove pacemaker    . Dilatation & currettage/hysteroscopy with resectocope N/A 11/10/2013    Procedure: DILATATION & CURETTAGE/HYSTEROSCOPY WITH MYOSURE;   Surgeon: Darlyn Chamber, MD;  Location: [facility];  Service: Gynecology;  Laterality: N/A;   Family History  Problem Relation Age of Onset  . Diabetes Mother   . Diabetes Father   . Alcohol abuse Father   . Hypertension Father   . Stroke Father   . Deep vein thrombosis Brother    History  Substance Use Topics  . Smoking status: Heavy Tobacco Smoker -- 1.00 packs/day for 30 years    Types: Cigarettes  . Smokeless tobacco: Never Used  . Alcohol Use: Yes     Comment: social   OB History   Grav Para Term Preterm Abortions TAB SAB Ect Mult Living                 Review of Systems  Ten systems reviewed and are negative for acute change, except as noted in the HPI.    Allergies  Review of patient's allergies indicates no known allergies.  Home Medications   Prior to Admission medications   Medication Sig Start Date End Date Taking? Authorizing Provider  aspirin 325 MG EC tablet Take 325 mg by mouth daily.   Yes Historical Provider, MD  B Complex Vitamins (VITAMIN B COMPLEX) TABS Take 1 tablet by mouth daily.   Yes Historical Provider, MD  Cholecalciferol (VITAMIN D-3) 1000 UNITS CAPS Take 1,000 Units by mouth daily.   Yes Historical Provider, MD  hydrochlorothiazide (MICROZIDE) 12.5 MG capsule Take 1 capsule by mouth  every day 10/17/13  Yes Mihai Croitoru,  MD  Multiple Vitamin (MULTIVITAMIN WITH MINERALS) TABS tablet Take 1 tablet by mouth daily.   Yes Historical Provider, MD  Nebivolol HCl (BYSTOLIC PO) Take 10 mg by mouth every evening.    Yes Historical Provider, MD  Omega-3 Fatty Acids (FISH OIL) 1000 MG CAPS Take 2,000 mg by mouth daily.   Yes Historical Provider, MD  venlafaxine XR (EFFEXOR-XR) 75 MG 24 hr capsule Take 75 mg by mouth every evening.   Yes Historical Provider, MD   BP 129/75  Pulse 77  Temp(Src) 98.5 F (36.9 C) (Oral)  Resp 16  Ht 5' 6.75" (1.695 m)  Wt 210 lb (95.255 kg)  BMI 33.15 kg/m2  SpO2 95%  LMP 06/15/2013 Physical Exam  Nursing note and  vitals reviewed. Constitutional: She is oriented to person, place, and time. She appears well-developed and well-nourished. No distress.  HENT:  Head: Normocephalic and atraumatic.  Eyes: Conjunctivae are normal. No scleral icterus.  Neck: Normal range of motion.  Cardiovascular: Normal rate, regular rhythm and normal heart sounds.  Exam reveals no gallop and no friction rub.   No murmur heard. Pulmonary/Chest: Effort normal and breath sounds normal. No respiratory distress.  Abdominal: Soft. Bowel sounds are normal. She exhibits no distension and no mass. There is tenderness. There is no guarding.  RLQ pain with deep palpation. Negative psoas sign. No cva tenderness. TTP R lumbar paraspinal muscles.  Neurological: She is alert and oriented to person, place, and time.  Skin: Skin is warm and dry. She is not diaphoretic.    ED Course  Procedures (including critical care time) Labs Review Labs Reviewed  CBC WITH DIFFERENTIAL - Abnormal; Notable for the following:    WBC 10.7 (*)    All other components within normal limits  COMPREHENSIVE METABOLIC PANEL - Abnormal; Notable for the following:    Glucose, Bld 100 (*)    Total Bilirubin <0.2 (*)    All other components within normal limits  LIPASE, BLOOD  URINALYSIS, ROUTINE W REFLEX MICROSCOPIC    Imaging Review No results found.   EKG Interpretation None      MDM   Final diagnoses:  None    Patient with abdominal pain.  Seems like muscular spasm. Doubt kidney stone as UA is clear.  Labs otherwise unremarkable.TTP RLQ. Ct pending ro appy  Patient is nontoxic, nonseptic appearing, in no apparent distress.  Patient's pain and other symptoms adequately managed in emergency department.  Fluid bolus given.  Labs, imaging and vitals reviewed.  Patient does not meet the SIRS or Sepsis criteria.  On repeat exam patient does not have a surgical abdomin and there are nor peritoneal signs.  No indication of appendicitis, bowel  obstruction, bowel perforation, cholecystitis, diverticulitis. Patient discharged home with symptomatic treatment and given strict instructions for follow-up with their primary care physician.  I have also discussed reasons to return immediately to the ER.  Patient expresses understanding and agrees with plan.       Margarita Mail, PA-C 02/08/14 1514

## 2014-02-08 ENCOUNTER — Encounter (HOSPITAL_COMMUNITY): Payer: Self-pay

## 2014-02-08 MED ORDER — HYDROMORPHONE HCL PF 1 MG/ML IJ SOLN
1.0000 mg | Freq: Once | INTRAMUSCULAR | Status: AC
  Administered 2014-02-08: 1 mg via INTRAVENOUS
  Filled 2014-02-08: qty 1

## 2014-02-08 MED ORDER — CYCLOBENZAPRINE HCL 10 MG PO TABS: 10.0000 mg | ORAL_TABLET | Freq: Three times a day (TID) | ORAL | Status: DC | PRN

## 2014-02-08 MED ORDER — IOHEXOL 300 MG/ML  SOLN
100.0000 mL | Freq: Once | INTRAMUSCULAR | Status: AC | PRN
  Administered 2014-02-08: 100 mL via INTRAVENOUS

## 2014-02-08 MED ORDER — HYDROCODONE-ACETAMINOPHEN 5-325 MG PO TABS: 1.0000 | ORAL_TABLET | Freq: Four times a day (QID) | ORAL | Status: DC | PRN

## 2014-02-08 NOTE — ED Notes (Signed)
Patient is alert and oriented x3.  She was given DC instructions and follow up visit instructions.  Patient gave verbal understanding. She was DC ambulatory under her own power to home.  V/S stable.  He was not showing any signs of distress on DC 

## 2014-02-08 NOTE — ED Provider Notes (Signed)
Medical screening examination/treatment/procedure(s) were conducted as a shared visit with non-physician practitioner(s) and myself.  I personally evaluated the patient during the encounter.   EKG Interpretation None      Pt is a 53 y.o. F with history of hypertension, complete heart block status post pacemaker who presents emergency right lower quarter pain that started today. No associated fevers, chills, nausea, vomiting or diarrhea, dysuria hematuria, vaginal bleeding or discharge. She states it feels like her "colon is spasming". She has some mild leukocytosis otherwise her labs and urine are unremarkable. CT scan pending to rule out colitis versus appendicitis. If negative, anticipate discharge home.  [place], DO 02/08/14 0002

## 2014-02-08 NOTE — Discharge Instructions (Signed)
Abdominal (belly) pain can be caused by many things. Your caregiver performed an examination and possibly ordered blood/urine tests and imaging (CT scan, x-rays, ultrasound). Many cases can be observed and treated at home after initial evaluation in the emergency department. Even though you are being discharged home, abdominal pain can be unpredictable. Therefore, you need a repeated exam if your pain does not resolve, returns, or worsens. Most patients with abdominal pain don't have to be admitted to the hospital or have surgery, but serious problems like appendicitis and gallbladder attacks can start out as nonspecific pain. Many abdominal conditions cannot be diagnosed in one visit, so follow-up evaluations are very important. SEEK IMMEDIATE MEDICAL ATTENTION IF: The pain does not go away or becomes severe.  A temperature above 101 develops.  Repeated vomiting occurs (multiple episodes).  The pain becomes localized to portions of the abdomen. The right side could possibly be appendicitis. In an adult, the left lower portion of the abdomen could be colitis or diverticulitis.  Blood is being passed in stools or vomit (bright red or black tarry stools).  Return also if you develop chest pain, difficulty breathing, dizziness or fainting, or become confused, poorly responsive, or inconsolable (young children).  Abdominal Pain, Women Abdominal (stomach, pelvic, or belly) pain can be caused by many things. It is important to tell your doctor:  The location of the pain.  Does it come and go or is it present all the time?  Are there things that start the pain (eating certain foods, exercise)?  Are there other symptoms associated with the pain (fever, nausea, vomiting, diarrhea)? All of this is helpful to know when trying to find the cause of the pain. CAUSES   Stomach: virus or bacteria infection, or ulcer.  Intestine: appendicitis (inflamed appendix), regional ileitis (Crohn's disease), ulcerative  colitis (inflamed colon), irritable bowel syndrome, diverticulitis (inflamed diverticulum of the colon), or cancer of the stomach or intestine.  Gallbladder disease or stones in the gallbladder.  Kidney disease, kidney stones, or infection.  Pancreas infection or cancer.  Fibromyalgia (pain disorder).  Diseases of the female organs:  Uterus: fibroid (non-cancerous) tumors or infection.  Fallopian tubes: infection or tubal pregnancy.  Ovary: cysts or tumors.  Pelvic adhesions (scar tissue).  Endometriosis (uterus lining tissue growing in the pelvis and on the pelvic organs).  Pelvic congestion syndrome (female organs filling up with blood just before the menstrual period).  Pain with the menstrual period.  Pain with ovulation (producing an egg).  Pain with an IUD (intrauterine device, birth control) in the uterus.  Cancer of the female organs.  Functional pain (pain not caused by a disease, may improve without treatment).  Psychological pain.  Depression. DIAGNOSIS  Your doctor will decide the seriousness of your pain by doing an examination.  Blood tests.  X-rays.  Ultrasound.  CT scan (computed tomography, special type of X-ray).  MRI (magnetic resonance imaging).  Cultures, for infection.  Barium enema (dye inserted in the large intestine, to better view it with X-rays).  Colonoscopy (looking in intestine with a lighted tube).  Laparoscopy (minor surgery, looking in abdomen with a lighted tube).  Major abdominal exploratory surgery (looking in abdomen with a large incision). TREATMENT  The treatment will depend on the cause of the pain.   Many cases can be observed and treated at home.  Over-the-counter medicines recommended by your caregiver.  Prescription medicine.  Antibiotics, for infection.  Birth control pills, for painful periods or for ovulation pain.  Hormone  treatment, for endometriosis.  Nerve blocking injections.  Physical  therapy.  Antidepressants.  Counseling with a psychologist or psychiatrist.  Minor or major surgery. HOME CARE INSTRUCTIONS   Do not take laxatives, unless directed by your caregiver.  Take over-the-counter pain medicine only if ordered by your caregiver. Do not take aspirin because it can cause an upset stomach or bleeding.  Try a clear liquid diet (broth or water) as ordered by your caregiver. Slowly move to a bland diet, as tolerated, if the pain is related to the stomach or intestine.  Have a thermometer and take your temperature several times a day, and record it.  Bed rest and sleep, if it helps the pain.  Avoid sexual intercourse, if it causes pain.  Avoid stressful situations.  Keep your follow-up appointments and tests, as your caregiver orders.  If the pain does not go away with medicine or surgery, you may try:  Acupuncture.  Relaxation exercises (yoga, meditation).  Group therapy.  Counseling. SEEK MEDICAL CARE IF:   You notice certain foods cause stomach pain.  Your home care treatment is not helping your pain.  You need stronger pain medicine.  You want your IUD removed.  You feel faint or lightheaded.  You develop nausea and vomiting.  You develop a rash.  You are having side effects or an allergy to your medicine. SEEK IMMEDIATE MEDICAL CARE IF:   Your pain does not go away or gets worse.  You have a fever.  Your pain is felt only in portions of the abdomen. The right side could possibly be appendicitis. The left lower portion of the abdomen could be colitis or diverticulitis.  You are passing blood in your stools (bright red or black tarry stools, with or without vomiting).  You have blood in your urine.  You develop chills, with or without a fever.  You pass out. MAKE SURE YOU:   Understand these instructions.  Will watch your condition.  Will get help right away if you are not doing well or get worse. Document Released:  04/20/2007 Document Revised: 11/07/2013 Document Reviewed: 05/10/2009 [facility] Patient Information 2015 [place], [place]. This information is not intended to replace advice given to you by your health care provider. Make sure you discuss any questions you have with your health care provider.   Please google search illiopsoas syndrome/ spasm.

## 2014-02-09 NOTE — ED Provider Notes (Signed)
Medical screening examination/treatment/procedure(s) were performed by non-physician practitioner and as supervising physician I was immediately available for consultation/collaboration.   EKG Interpretation None        [place], DO 02/09/14 [phone]

## 2014-02-20 ENCOUNTER — Ambulatory Visit (INDEPENDENT_AMBULATORY_CARE_PROVIDER_SITE_OTHER): Payer: 59 | Admitting: *Deleted

## 2014-02-20 ENCOUNTER — Telehealth: Payer: Self-pay | Admitting: Cardiology

## 2014-02-20 DIAGNOSIS — I442 Atrioventricular block, complete: Secondary | ICD-10-CM

## 2014-02-20 NOTE — Telephone Encounter (Signed)
Spoke with pt and reminded pt of remote transmission that is due today. Pt verbalized understanding.   

## 2014-02-21 NOTE — Progress Notes (Signed)
Remote pacemaker transmission.   

## 2014-02-22 LAB — MDC_IDC_ENUM_SESS_TYPE_REMOTE
Battery Impedance: 252 Ohm
Battery Remaining Longevity: 119 mo
Battery Voltage: 2.8 V
Brady Statistic AP VP Percent: 1 %
Brady Statistic AP VS Percent: 0 %
Brady Statistic AS VP Percent: 99 %
Lead Channel Impedance Value: 474 Ohm
Lead Channel Impedance Value: 615 Ohm
Lead Channel Pacing Threshold Amplitude: 0.375 V
Lead Channel Pacing Threshold Amplitude: 0.625 V
Lead Channel Pacing Threshold Pulse Width: 0.4 ms
Lead Channel Sensing Intrinsic Amplitude: 2.8 mV
Lead Channel Setting Pacing Amplitude: 1.5 V
Lead Channel Setting Pacing Amplitude: 2 V
Lead Channel Setting Pacing Pulse Width: 0.4 ms
Lead Channel Setting Sensing Sensitivity: 4 mV
MDC IDC MSMT LEADCHNL RV PACING THRESHOLD PULSEWIDTH: 0.4 ms
MDC IDC SESS DTM: 20150818095338
MDC IDC STAT BRADY AS VS PERCENT: 0 %

## 2014-02-28 ENCOUNTER — Telehealth: Payer: Self-pay | Admitting: *Deleted

## 2014-02-28 ENCOUNTER — Encounter: Payer: Self-pay | Admitting: Cardiology

## 2014-02-28 NOTE — Telephone Encounter (Signed)
Asked patient if she had tried to send [facility] remote transmission since it was requested earlier. Patients states that she hadn't, but could once she gets home.

## 2014-03-01 NOTE — Telephone Encounter (Signed)
Patient informed that she did not have any episodes recorded on her remote transmission. Patient voiced understanding. MC aware. Will inform Dr.Griffin's ofc.

## 2014-03-02 ENCOUNTER — Encounter: Payer: Self-pay | Admitting: Cardiology

## 2014-03-06 ENCOUNTER — Encounter: Payer: Self-pay | Admitting: Cardiovascular Disease

## 2014-04-12 ENCOUNTER — Other Ambulatory Visit: Payer: Self-pay | Admitting: Obstetrics and Gynecology

## 2014-04-13 LAB — CYTOLOGY - PAP

## 2014-05-15 ENCOUNTER — Telehealth: Payer: Self-pay | Admitting: Cardiovascular Disease

## 2014-05-15 NOTE — Telephone Encounter (Signed)
Closed encounter °

## 2014-05-16 ENCOUNTER — Other Ambulatory Visit: Payer: Self-pay | Admitting: Internal Medicine

## 2014-05-24 ENCOUNTER — Ambulatory Visit (INDEPENDENT_AMBULATORY_CARE_PROVIDER_SITE_OTHER): Payer: 59 | Admitting: *Deleted

## 2014-05-24 ENCOUNTER — Telehealth: Payer: Self-pay | Admitting: Cardiology

## 2014-05-24 DIAGNOSIS — I442 Atrioventricular block, complete: Secondary | ICD-10-CM

## 2014-05-24 NOTE — Telephone Encounter (Signed)
Spoke with pt and reminded pt of remote transmission that is due today. Pt verbalized understanding.   

## 2014-05-25 DIAGNOSIS — I442 Atrioventricular block, complete: Secondary | ICD-10-CM

## 2014-05-25 LAB — MDC_IDC_ENUM_SESS_TYPE_REMOTE
Battery Remaining Longevity: 119 mo
Brady Statistic AP VP Percent: 1 %
Brady Statistic AP VS Percent: 0 %
Brady Statistic AS VP Percent: 99 %
Brady Statistic AS VS Percent: 0 %
Lead Channel Impedance Value: 613 Ohm
Lead Channel Pacing Threshold Amplitude: 0.625 V
Lead Channel Pacing Threshold Pulse Width: 0.4 ms
Lead Channel Pacing Threshold Pulse Width: 0.4 ms
Lead Channel Sensing Intrinsic Amplitude: 2.8 mV
Lead Channel Setting Pacing Amplitude: 1.5 V
Lead Channel Setting Pacing Pulse Width: 0.4 ms
MDC IDC MSMT BATTERY IMPEDANCE: 252 Ohm
MDC IDC MSMT BATTERY VOLTAGE: 2.8 V
MDC IDC MSMT LEADCHNL RA IMPEDANCE VALUE: 494 Ohm
MDC IDC MSMT LEADCHNL RA PACING THRESHOLD AMPLITUDE: 0.375 V
MDC IDC SESS DTM: 20151119083702
MDC IDC SET LEADCHNL RV PACING AMPLITUDE: 2 V
MDC IDC SET LEADCHNL RV SENSING SENSITIVITY: 4 mV

## 2014-05-25 NOTE — Progress Notes (Signed)
Remote pacemaker transmission.   

## 2014-05-30 ENCOUNTER — Encounter: Payer: Self-pay | Admitting: Cardiology

## 2014-06-15 ENCOUNTER — Encounter: Payer: Self-pay | Admitting: Cardiovascular Disease

## 2014-07-11 ENCOUNTER — Other Ambulatory Visit: Payer: Self-pay | Admitting: Cardiovascular Disease

## 2014-07-11 NOTE — Telephone Encounter (Signed)
Rx(s) sent to pharmacy electronically. OV 08/15/14

## 2014-08-14 ENCOUNTER — Other Ambulatory Visit (HOSPITAL_COMMUNITY): Payer: Self-pay | Admitting: Internal Medicine

## 2014-08-14 DIAGNOSIS — R1011 Right upper quadrant pain: Secondary | ICD-10-CM

## 2014-08-15 ENCOUNTER — Encounter: Payer: Self-pay | Admitting: Cardiovascular Disease

## 2014-08-15 ENCOUNTER — Ambulatory Visit (INDEPENDENT_AMBULATORY_CARE_PROVIDER_SITE_OTHER): Payer: 59 | Admitting: Cardiovascular Disease

## 2014-08-15 VITALS — BP 120/80 | HR 73 | Resp 16 | Ht 66.75 in | Wt 215.7 lb

## 2014-08-15 DIAGNOSIS — Z950 Presence of cardiac pacemaker: Secondary | ICD-10-CM

## 2014-08-15 DIAGNOSIS — I442 Atrioventricular block, complete: Secondary | ICD-10-CM

## 2014-08-15 DIAGNOSIS — Z720 Tobacco use: Secondary | ICD-10-CM

## 2014-08-15 DIAGNOSIS — I10 Essential (primary) hypertension: Secondary | ICD-10-CM

## 2014-08-15 DIAGNOSIS — E669 Obesity, unspecified: Secondary | ICD-10-CM

## 2014-08-15 NOTE — Progress Notes (Signed)
Patient ID: Mandy Sellers, female   DOB: 18-Sep-1960, 54 y.o.   MRN: [id number]     Reason for office visit Complete heart block, pacemaker check  Mandy Sellers is now 54 years old. She has had an uneventful year since her last in office pacemaker check. She is pacemaker dependent secondary to complete heart block (she does have a very slow ventricular escape rhythm at about 35 bpm, but feels poorly when she has ventricular pacing threshold checks). She is compliant with every 3 month remote pacemaker checks and no adverse arrhythmic events have been recorded. Unfortunate she continues to smoke. She remains mildly obese. Her blood pressure is well controlled on medication. She denies any cardiovascular symptoms.  Pacemaker interrogation shows normal device function with an estimated generator longevity of greater than 9 years, 100% atrial sensed intricate a paced rhythm with normal heart rate histograms and no recorded episodes of meaningful arrhythmia. Lead parameters are excellent and unchanged from historical values.  No Known Allergies  Current Outpatient Prescriptions  Medication Sig Dispense Refill  . aspirin 325 MG EC tablet Take 325 mg by mouth daily.    . B Complex Vitamins (VITAMIN B COMPLEX) TABS Take 1 tablet by mouth daily.    . Cholecalciferol (VITAMIN D-3) 1000 UNITS CAPS Take 1,000 Units by mouth daily.    . hydrochlorothiazide (MICROZIDE) 12.5 MG capsule Take 1 capsule (12.5 mg total) by mouth daily. 90 capsule 0  . Multiple Vitamin (MULTIVITAMIN WITH MINERALS) TABS tablet Take 1 tablet by mouth daily.    . Nebivolol HCl (BYSTOLIC PO) Take 10 mg by mouth every evening.     . Omega-3 Fatty Acids (FISH OIL) 1000 MG CAPS Take 2,000 mg by mouth daily.    Marland Kitchen venlafaxine XR (EFFEXOR-XR) 75 MG 24 hr capsule Take 75 mg by mouth every evening.     No current facility-administered medications for this visit.    Past Medical History  Diagnosis Date  . Hypertension   . Depression   .  Pacemaker     dual-chmber for CHB in 2010  . Tobacco abuse   . CHB (complete heart block) 08/09/2013  . Obesity (BMI 30.0-34.9) 08/09/2013  . HTN (hypertension) 08/09/2013    Past Surgical History  Procedure Laterality Date  . Pacemaker insertion  03/30/2009    for CHB; Medtronic Adapta ADDRL1 - Dr. Norlene Duel  . Incontinence surgery  03/2000  . Tubal ligation  03/2000  . Transthoracic echocardiogram  09/2010    EF=>55%; mild MR; trace pulm valve regurg   . Nm myocar perf wall motion  05/2009    bruce myoview; normal pattern of perfusion in all regions, low risk scan  . Insert / replace / remove pacemaker    . Dilatation & currettage/hysteroscopy with resectocope N/A 11/10/2013    Procedure: DILATATION & CURETTAGE/HYSTEROSCOPY WITH MYOSURE;  Surgeon: Darlyn Chamber, MD;  Location: [facility];  Service: Gynecology;  Laterality: N/A;    Family History  Problem Relation Age of Onset  . Diabetes Mother   . Diabetes Father   . Alcohol abuse Father   . Hypertension Father   . Stroke Father   . Deep vein thrombosis Brother     History   Social History  . Marital Status: Married    Spouse Name: N/A    Number of Children: 1  . Years of Education: master's   Occupational History  .  [employer] History Main Topics  . Smoking status: Heavy Tobacco  Smoker -- 1.00 packs/day for 30 years    Types: Cigarettes  . Smokeless tobacco: Never Used  . Alcohol Use: Yes     Comment: social  . Drug Use: No  . Sexual Activity: Yes   Other Topics Concern  . Not on file   Social History Narrative    Review of systems: The patient specifically denies any chest pain at rest or with exertion, dyspnea at rest or with exertion, orthopnea, paroxysmal nocturnal dyspnea, syncope, palpitations, focal neurological deficits, intermittent claudication, lower extremity edema, unexplained weight gain, cough, hemoptysis or wheezing.  The patient also denies abdominal pain, nausea, vomiting, dysphagia,  diarrhea, constipation, polyuria, polydipsia, dysuria, hematuria, frequency, urgency, abnormal bleeding or bruising, fever, chills, unexpected weight changes, mood swings, change in skin or hair texture, change in voice quality, auditory or visual problems, allergic reactions or rashes, new musculoskeletal complaints other than usual "aches and pains".   PHYSICAL EXAM BP 120/80 mmHg  Pulse 73  Resp 16  Ht 5' 6.75" (1.695 m)  Wt 97.841 kg (215 lb 11.2 oz)  BMI 34.06 kg/m2  LMP 06/15/2013  General: Alert, oriented x3, no distress Head: no evidence of trauma, PERRL, EOMI, no exophtalmos or lid lag, no myxedema, no xanthelasma; normal ears, nose and oropharynx Neck: normal jugular venous pulsations and no hepatojugular reflux; brisk carotid pulses without delay and no carotid bruits Chest: clear to auscultation, no signs of consolidation by percussion or palpation, normal fremitus, symmetrical and full respiratory excursions, healthy left subclavian pacemaker site Cardiovascular: normal position and quality of the apical impulse, regular rhythm, normal first and paradoxically split second heart sounds, no murmurs, rubs or gallops Abdomen: no tenderness or distention, no masses by palpation, no abnormal pulsatility or arterial bruits, normal bowel sounds, no hepatosplenomegaly Extremities: no clubbing, cyanosis or edema; 2+ radial, ulnar and brachial pulses bilaterally; 2+ right femoral, posterior tibial and dorsalis pedis pulses; 2+ left femoral, posterior tibial and dorsalis pedis pulses; no subclavian or femoral bruits Neurological: grossly nonfocal   EKG: Atrial sensed ventricular paced  Lipid Panel     Component Value Date/Time   CHOL  03/29/2009 0730    144        ATP III CLASSIFICATION:  <200     mg/dL   Desirable  200-239  mg/dL   Borderline High  >=240    mg/dL   High          TRIG 123 03/29/2009 0730   HDL 50 03/29/2009 0730   CHOLHDL 2.9 03/29/2009 0730   VLDL 25 03/29/2009  0730   LDLCALC  03/29/2009 0730    69        Total Cholesterol/HDL:CHD Risk Coronary Heart Disease Risk Table                     Men   Women  1/2 Average Risk   3.4   3.3  Average Risk       5.0   4.4  2 X Average Risk   9.6   7.1  3 X Average Risk  23.4   11.0        Use the calculated Patient Ratio above and the CHD Risk Table to determine the patient's CHD Risk.        ATP III CLASSIFICATION (LDL):  <100     mg/dL   Optimal  100-129  mg/dL   Near or Above  Optimal  130-159  mg/dL   Borderline  160-189  mg/dL   High  >190     mg/dL   Very High    BMET    Component Value Date/Time   NA 138 02/07/2014 1942   K 4.4 02/07/2014 1942   CL 100 02/07/2014 1942   CO2 23 02/07/2014 1942   GLUCOSE 100* 02/07/2014 1942   BUN 15 02/07/2014 1942   CREATININE 0.75 02/07/2014 1942   CALCIUM 10.2 02/07/2014 1942   GFRNONAA >90 02/07/2014 1942   GFRAA >90 02/07/2014 1942     ASSESSMENT AND PLAN  Asmara has complete heart block with a normally functioning dual-chamber permanent pacemaker. Continue every 3 month remote pacemaker checks and yearly office visits. Reinforced importance of smoking cessation as well as the need to lose weight. Reminded about the risk of electromagnetic interference that may be significant in a pacemaker dependent patient.  Orders Placed This Encounter  Procedures  . EKG 12-Lead   No orders of the defined types were placed in this encounter.    Holli Humbles, MD, [place] [phone] office [phone] pager

## 2014-08-15 NOTE — Patient Instructions (Signed)
Remote monitoring is used to monitor your pacemaker from home. This monitoring reduces the number of office visits required to check your device to one time per year. It allows [place] to keep an eye on the functioning of your device to ensure it is working properly. You are scheduled for a device check from home on 11-14-2014. You may send your transmission at any time that day. If you have a wireless device, the transmission will be sent automatically. After your physician reviews your transmission, you will receive a postcard with your next transmission date.  Your physician recommends that you schedule a follow-up appointment in: 12 months with Dr.Croitoru

## 2014-08-16 ENCOUNTER — Ambulatory Visit (HOSPITAL_COMMUNITY)
Admission: RE | Admit: 2014-08-16 | Discharge: 2014-08-16 | Disposition: A | Discharge status: Home / Self Care | Payer: 59 | Source: Ambulatory Visit | Attending: Internal Medicine | Admitting: Internal Medicine

## 2014-08-16 DIAGNOSIS — G8929 Other chronic pain: Secondary | ICD-10-CM | POA: Insufficient documentation

## 2014-08-16 DIAGNOSIS — R1011 Right upper quadrant pain: Secondary | ICD-10-CM | POA: Diagnosis not present

## 2014-08-16 DIAGNOSIS — R110 Nausea: Secondary | ICD-10-CM | POA: Diagnosis not present

## 2014-08-16 MED ORDER — SINCALIDE 5 MCG IJ SOLR
0.0200 ug/kg | Freq: Once | INTRAMUSCULAR | Status: AC
  Administered 2014-08-16: 12:00:00 via INTRAVENOUS

## 2014-08-16 MED ORDER — TECHNETIUM TC 99M MEBROFENIN IV KIT
5.0000 | PACK | Freq: Once | INTRAVENOUS | Status: AC | PRN
  Administered 2014-08-16: 5 via INTRAVENOUS

## 2014-08-16 MED ORDER — SINCALIDE 5 MCG IJ SOLR
INTRAMUSCULAR | Status: AC
  Filled 2014-08-16: qty 5

## 2014-08-17 LAB — MDC_IDC_ENUM_SESS_TYPE_INCLINIC
Battery Remaining Longevity: 115 mo
Battery Voltage: 2.8 V
Brady Statistic AP VP Percent: 1 %
Brady Statistic AS VP Percent: 99 %
Lead Channel Impedance Value: 473 Ohm
Lead Channel Sensing Intrinsic Amplitude: 2.8 mV
Lead Channel Setting Pacing Amplitude: 1.5 V
Lead Channel Setting Pacing Pulse Width: 0.4 ms
MDC IDC MSMT BATTERY IMPEDANCE: 275 Ohm
MDC IDC MSMT LEADCHNL RA PACING THRESHOLD AMPLITUDE: 0.375 V
MDC IDC MSMT LEADCHNL RA PACING THRESHOLD PULSEWIDTH: 0.4 ms
MDC IDC MSMT LEADCHNL RV IMPEDANCE VALUE: 582 Ohm
MDC IDC MSMT LEADCHNL RV PACING THRESHOLD AMPLITUDE: 0.625 V
MDC IDC MSMT LEADCHNL RV PACING THRESHOLD PULSEWIDTH: 0.4 ms
MDC IDC SESS DTM: 20160209202553
MDC IDC SET LEADCHNL RV PACING AMPLITUDE: 2 V
MDC IDC SET LEADCHNL RV SENSING SENSITIVITY: 4 mV
MDC IDC STAT BRADY AP VS PERCENT: 0 %
MDC IDC STAT BRADY AS VS PERCENT: 0 %

## 2014-08-22 ENCOUNTER — Encounter: Payer: Self-pay | Admitting: Cardiovascular Disease

## 2014-09-13 ENCOUNTER — Other Ambulatory Visit: Payer: Self-pay | Admitting: Cardiovascular Disease

## 2014-09-13 NOTE — Telephone Encounter (Signed)
Rx(s) sent to pharmacy electronically.  

## 2014-09-24 ENCOUNTER — Other Ambulatory Visit: Payer: Self-pay | Admitting: Cardiovascular Disease

## 2014-09-25 NOTE — Telephone Encounter (Signed)
Rx(s) sent to pharmacy electronically.  

## 2014-11-14 ENCOUNTER — Ambulatory Visit (INDEPENDENT_AMBULATORY_CARE_PROVIDER_SITE_OTHER): Payer: 59 | Admitting: *Deleted

## 2014-11-14 ENCOUNTER — Encounter: Payer: Self-pay | Admitting: Cardiovascular Disease

## 2014-11-14 DIAGNOSIS — I442 Atrioventricular block, complete: Secondary | ICD-10-CM

## 2014-11-14 NOTE — Progress Notes (Signed)
Remote pacemaker transmission.   

## 2014-11-17 LAB — CUP PACEART REMOTE DEVICE CHECK
Battery Voltage: 2.8 V
Brady Statistic AP VP Percent: 1 %
Brady Statistic AP VS Percent: 0 %
Brady Statistic AS VS Percent: 0 %
Date Time Interrogation Session: 20160510064326
Lead Channel Impedance Value: 480 Ohm
Lead Channel Impedance Value: 596 Ohm
Lead Channel Pacing Threshold Amplitude: 0.625 V
Lead Channel Pacing Threshold Pulse Width: 0.4 ms
Lead Channel Pacing Threshold Pulse Width: 0.4 ms
Lead Channel Sensing Intrinsic Amplitude: 2.8 mV
Lead Channel Setting Pacing Amplitude: 1.5 V
Lead Channel Setting Pacing Amplitude: 2 V
Lead Channel Setting Pacing Pulse Width: 0.4 ms
Lead Channel Setting Sensing Sensitivity: 4 mV
MDC IDC MSMT BATTERY IMPEDANCE: 300 Ohm
MDC IDC MSMT BATTERY REMAINING LONGEVITY: 112 mo
MDC IDC MSMT LEADCHNL RA PACING THRESHOLD AMPLITUDE: 0.375 V
MDC IDC STAT BRADY AS VP PERCENT: 99 %

## 2014-11-21 ENCOUNTER — Encounter: Payer: Self-pay | Admitting: Cardiology

## 2015-02-13 ENCOUNTER — Telehealth: Payer: Self-pay | Admitting: Cardiology

## 2015-02-13 ENCOUNTER — Ambulatory Visit (INDEPENDENT_AMBULATORY_CARE_PROVIDER_SITE_OTHER): Payer: 59 | Admitting: *Deleted

## 2015-02-13 DIAGNOSIS — I442 Atrioventricular block, complete: Secondary | ICD-10-CM | POA: Diagnosis not present

## 2015-02-13 NOTE — Telephone Encounter (Signed)
Spoke with pt and reminded pt of remote transmission that is due today. Pt verbalized understanding.   

## 2015-02-14 NOTE — Progress Notes (Signed)
Remote pacemaker transmission.   

## 2015-02-20 LAB — CUP PACEART REMOTE DEVICE CHECK
Battery Impedance: 300 Ohm
Brady Statistic AP VP Percent: 1 %
Brady Statistic AP VS Percent: 0 %
Brady Statistic AS VP Percent: 99 %
Brady Statistic AS VS Percent: 0 %
Date Time Interrogation Session: 20160809193309
Lead Channel Impedance Value: 502 Ohm
Lead Channel Impedance Value: 611 Ohm
Lead Channel Pacing Threshold Amplitude: 0.375 V
Lead Channel Pacing Threshold Pulse Width: 0.4 ms
Lead Channel Sensing Intrinsic Amplitude: 2.8 mV
Lead Channel Setting Pacing Amplitude: 1.5 V
Lead Channel Setting Pacing Pulse Width: 0.4 ms
MDC IDC MSMT BATTERY REMAINING LONGEVITY: 113 mo
MDC IDC MSMT BATTERY VOLTAGE: 2.8 V
MDC IDC MSMT LEADCHNL RV PACING THRESHOLD AMPLITUDE: 0.625 V
MDC IDC MSMT LEADCHNL RV PACING THRESHOLD PULSEWIDTH: 0.4 ms
MDC IDC SET LEADCHNL RV PACING AMPLITUDE: 2 V
MDC IDC SET LEADCHNL RV SENSING SENSITIVITY: 4 mV

## 2015-03-07 ENCOUNTER — Encounter: Payer: Self-pay | Admitting: Cardiology

## 2015-03-13 ENCOUNTER — Encounter: Payer: Self-pay | Admitting: Cardiovascular Disease

## 2015-03-21 ENCOUNTER — Encounter: Payer: Self-pay | Admitting: Cardiology

## 2015-05-15 ENCOUNTER — Telehealth: Payer: Self-pay | Admitting: Cardiology

## 2015-05-15 ENCOUNTER — Ambulatory Visit (INDEPENDENT_AMBULATORY_CARE_PROVIDER_SITE_OTHER): Payer: 59 | Admitting: *Deleted

## 2015-05-15 DIAGNOSIS — I442 Atrioventricular block, complete: Secondary | ICD-10-CM

## 2015-05-15 NOTE — Telephone Encounter (Signed)
Spoke with pt and reminded pt of remote transmission that is due today. Pt verbalized understanding.   

## 2015-05-16 ENCOUNTER — Encounter: Payer: Self-pay | Admitting: Cardiology

## 2015-05-17 LAB — CUP PACEART REMOTE DEVICE CHECK
Battery Impedance: 300 Ohm
Battery Remaining Longevity: 113 mo
Battery Voltage: 2.8 V
Date Time Interrogation Session: 20161109201119
Implantable Lead Implant Date: 20100924
Implantable Lead Implant Date: 20100924
Implantable Lead Location: 753859
Implantable Lead Model: 5076
Implantable Lead Model: 5076
Lead Channel Impedance Value: 481 Ohm
Lead Channel Impedance Value: 624 Ohm
Lead Channel Pacing Threshold Amplitude: 0.625 V
Lead Channel Pacing Threshold Pulse Width: 0.4 ms
Lead Channel Setting Pacing Amplitude: 1.5 V
Lead Channel Setting Pacing Amplitude: 2 V
Lead Channel Setting Pacing Pulse Width: 0.4 ms
MDC IDC LEAD LOCATION: 753860
MDC IDC MSMT LEADCHNL RA PACING THRESHOLD AMPLITUDE: 0.375 V
MDC IDC MSMT LEADCHNL RA PACING THRESHOLD PULSEWIDTH: 0.4 ms
MDC IDC MSMT LEADCHNL RA SENSING INTR AMPL: 2.8 mV
MDC IDC SET LEADCHNL RV SENSING SENSITIVITY: 4 mV
MDC IDC STAT BRADY AP VP PERCENT: 1 %
MDC IDC STAT BRADY AP VS PERCENT: 0 %
MDC IDC STAT BRADY AS VP PERCENT: 99 %
MDC IDC STAT BRADY AS VS PERCENT: 0 %

## 2015-05-17 NOTE — Progress Notes (Signed)
Remote pacemaker transmission.   

## 2015-05-18 ENCOUNTER — Encounter: Payer: Self-pay | Admitting: Cardiology

## 2015-08-17 ENCOUNTER — Emergency Department (HOSPITAL_COMMUNITY): Payer: 59

## 2015-08-17 ENCOUNTER — Encounter (HOSPITAL_COMMUNITY): Payer: Self-pay | Admitting: Emergency Medicine

## 2015-08-17 ENCOUNTER — Emergency Department (HOSPITAL_COMMUNITY)
Admission: EM | Admit: 2015-08-17 | Discharge: 2015-08-17 | Disposition: A | Discharge status: Home / Self Care | Payer: 59 | Attending: Emergency Medicine | Admitting: Emergency Medicine

## 2015-08-17 ENCOUNTER — Telehealth: Payer: Self-pay | Admitting: Cardiovascular Disease

## 2015-08-17 ENCOUNTER — Other Ambulatory Visit: Payer: Self-pay | Admitting: Cardiovascular Disease

## 2015-08-17 DIAGNOSIS — R05 Cough: Secondary | ICD-10-CM | POA: Diagnosis not present

## 2015-08-17 DIAGNOSIS — R0789 Other chest pain: Secondary | ICD-10-CM | POA: Insufficient documentation

## 2015-08-17 DIAGNOSIS — Z950 Presence of cardiac pacemaker: Secondary | ICD-10-CM | POA: Diagnosis not present

## 2015-08-17 DIAGNOSIS — E669 Obesity, unspecified: Secondary | ICD-10-CM | POA: Insufficient documentation

## 2015-08-17 DIAGNOSIS — Z79899 Other long term (current) drug therapy: Secondary | ICD-10-CM | POA: Diagnosis not present

## 2015-08-17 DIAGNOSIS — R059 Cough, unspecified: Secondary | ICD-10-CM

## 2015-08-17 DIAGNOSIS — F17210 Nicotine dependence, cigarettes, uncomplicated: Secondary | ICD-10-CM | POA: Diagnosis not present

## 2015-08-17 DIAGNOSIS — F329 Major depressive disorder, single episode, unspecified: Secondary | ICD-10-CM | POA: Insufficient documentation

## 2015-08-17 DIAGNOSIS — R079 Chest pain, unspecified: Secondary | ICD-10-CM | POA: Diagnosis present

## 2015-08-17 DIAGNOSIS — Z7982 Long term (current) use of aspirin: Secondary | ICD-10-CM | POA: Insufficient documentation

## 2015-08-17 DIAGNOSIS — I10 Essential (primary) hypertension: Secondary | ICD-10-CM | POA: Diagnosis not present

## 2015-08-17 LAB — CBC WITH DIFFERENTIAL/PLATELET
Basophils Absolute: 0 10*3/uL (ref 0.0–0.1)
Basophils Relative: 0 %
Eosinophils Absolute: 0.1 10*3/uL (ref 0.0–0.7)
Eosinophils Relative: 1 %
HEMATOCRIT: 42.2 % (ref 36.0–46.0)
HEMOGLOBIN: 14.5 g/dL (ref 12.0–15.0)
LYMPHS PCT: 28 %
Lymphs Abs: 2.3 10*3/uL (ref 0.7–4.0)
MCH: 30.7 pg (ref 26.0–34.0)
MCHC: 34.4 g/dL (ref 30.0–36.0)
MCV: 89.4 fL (ref 78.0–100.0)
MONOS PCT: 8 %
Monocytes Absolute: 0.6 10*3/uL (ref 0.1–1.0)
NEUTROS ABS: 5.1 10*3/uL (ref 1.7–7.7)
Neutrophils Relative %: 63 %
Platelets: 196 10*3/uL (ref 150–400)
RBC: 4.72 MIL/uL (ref 3.87–5.11)
RDW: 12.9 % (ref 11.5–15.5)
WBC: 8.1 10*3/uL (ref 4.0–10.5)

## 2015-08-17 LAB — COMPREHENSIVE METABOLIC PANEL
ALT: 62 U/L — ABNORMAL HIGH (ref 14–54)
ANION GAP: 12 (ref 5–15)
AST: 41 U/L (ref 15–41)
Albumin: 3.9 g/dL (ref 3.5–5.0)
Alkaline Phosphatase: 98 U/L (ref 38–126)
BILIRUBIN TOTAL: 0.4 mg/dL (ref 0.3–1.2)
BUN: 12 mg/dL (ref 6–20)
CO2: 24 mmol/L (ref 22–32)
Calcium: 9.7 mg/dL (ref 8.9–10.3)
Chloride: 103 mmol/L (ref 101–111)
Creatinine, Ser: 0.68 mg/dL (ref 0.44–1.00)
GFR calc Af Amer: >60 mL/min (ref >60)
Glucose, Bld: 92 mg/dL (ref 65–99)
POTASSIUM: 3.9 mmol/L (ref 3.5–5.1)
Sodium: 139 mmol/L (ref 135–145)
Total Protein: 7 g/dL (ref 6.5–8.1)

## 2015-08-17 LAB — I-STAT TROPONIN, ED
TROPONIN I, POC: 0 ng/mL (ref 0.00–0.08)
Troponin i, poc: 0 ng/mL (ref 0.00–0.08)

## 2015-08-17 LAB — LIPASE, BLOOD: LIPASE: 32 U/L (ref 11–51)

## 2015-08-17 MED ORDER — IPRATROPIUM-ALBUTEROL 0.5-2.5 (3) MG/3ML IN SOLN
3.0000 mL | Freq: Once | RESPIRATORY_TRACT | Status: AC
  Administered 2015-08-17: 3 mL via RESPIRATORY_TRACT
  Filled 2015-08-17: qty 3

## 2015-08-17 MED ORDER — ASPIRIN 81 MG PO CHEW
324.0000 mg | CHEWABLE_TABLET | Freq: Once | ORAL | Status: AC
  Administered 2015-08-17: 324 mg via ORAL
  Filled 2015-08-17: qty 4

## 2015-08-17 NOTE — ED Provider Notes (Signed)
CSN: [id number]     Arrival date & time 08/17/15  [id number] History   First MD Initiated Contact with Patient 08/17/15 [phone]     Chief Complaint  Patient presents with  . Chest Pain     (Consider location/radiation/quality/duration/timing/severity/associated sxs/prior Treatment) HPI 55 year old female who presents with chest pain. History of hypertension, chronic tobacco use, obesity, and complete heart block status post permanent pacemaker. States that over the past week she has had flu or bronchitis-like symptoms with cough, congestion. Was given a Z-Pak and steroid shot, with initial improvement. 2 days ago began to notice a sharp pain over the left side of her chest that is worse with coughing. Has not had pain like this before. Not worsened with deep inspiration.  Seems exacerbated with movement. Has not had leg swelling or leg pain. Has not had nausea, vomiting, diarrhea, fevers or chills. She called her cardiologist's office this morning and was sent to the ED by the nurse for evaluation of her chest pain.   Past Medical History  Diagnosis Date  . Hypertension   . Depression   . Pacemaker     dual-chmber for CHB in 2010  . Tobacco abuse   . CHB (complete heart block) ([place]) 08/09/2013  . Obesity (BMI 30.0-34.9) 08/09/2013  . HTN (hypertension) 08/09/2013   Past Surgical History  Procedure Laterality Date  . Pacemaker insertion  03/30/2009    for CHB; Medtronic Adapta ADDRL1 - Dr. Norlene Duel  . Incontinence surgery  03/2000  . Tubal ligation  03/2000  . Transthoracic echocardiogram  09/2010    EF=>55%; mild MR; trace pulm valve regurg   . Nm myocar perf wall motion  05/2009    bruce myoview; normal pattern of perfusion in all regions, low risk scan  . Insert / replace / remove pacemaker    . Dilatation & currettage/hysteroscopy with resectocope N/A 11/10/2013    Procedure: DILATATION & CURETTAGE/HYSTEROSCOPY WITH MYOSURE;  Surgeon: Darlyn Chamber, MD;  Location: [facility];  Service: Gynecology;   Laterality: N/A;   Family History  Problem Relation Age of Onset  . Diabetes Mother   . Diabetes Father   . Alcohol abuse Father   . Hypertension Father   . Stroke Father   . Deep vein thrombosis Brother    Social History  Substance Use Topics  . Smoking status: Heavy Tobacco Smoker -- 1.00 packs/day for 30 years    Types: Cigarettes  . Smokeless tobacco: Never Used  . Alcohol Use: Yes     Comment: social   OB History    No data available     Review of Systems 10/14 systems reviewed and are negative other than those stated in the HPI    Allergies  Varenicline  Home Medications   Prior to Admission medications   Medication Sig Start Date End Date Taking? Authorizing Provider  aspirin EC 81 MG tablet Take 81 mg by mouth at bedtime.   Yes Historical Provider, MD  azithromycin (ZITHROMAX) 250 MG tablet Take 1 tablet by mouth daily. For 5 days 08/15/15  Yes Historical Provider, MD  B Complex Vitamins (VITAMIN B COMPLEX) TABS Take 1 tablet by mouth daily.   Yes Historical Provider, MD  BYSTOLIC 10 MG tablet Take 1 tablet by mouth  daily Patient taking differently: Take 1 tablet by mouth at night before bed 09/25/14  Yes Mihai Croitoru, MD  Cholecalciferol (VITAMIN D-3) 1000 UNITS CAPS Take 1,000 Units by mouth daily.   Yes Historical Provider, MD  hydrochlorothiazide (MICROZIDE) 12.5 MG capsule Take 1 capsule by mouth  daily 09/13/14  Yes Mihai Croitoru, MD  Multiple Vitamin (MULTIVITAMIN WITH MINERALS) TABS tablet Take 1 tablet by mouth daily.   Yes Historical Provider, MD  Multiple Vitamins-Minerals (AIRBORNE) CHEW Chew 1 tablet by mouth daily.   Yes Historical Provider, MD  Omega-3 Fatty Acids (FISH OIL) 1000 MG CAPS Take 2,000 mg by mouth daily.   Yes Historical Provider, MD  venlafaxine XR (EFFEXOR-XR) 75 MG 24 hr capsule Take 75 mg by mouth every evening.   Yes Historical Provider, MD   BP 120/80 mmHg  Pulse 69  Temp(Src) 98.5 F (36.9 C) (Oral)  Resp 16  SpO2 94%  LMP  06/15/2013 Physical Exam Physical Exam  Nursing note and vitals reviewed. Constitutional: Well developed, well nourished, non-toxic, and in no acute distress Head: Normocephalic and atraumatic.  Mouth/Throat: Oropharynx is clear and moist.  Neck: Normal range of motion. Neck supple.  Cardiovascular: Normal rate and regular rhythm.  +2 symmetric DP pulses.  Pulmonary/Chest: Effort normal and breath sounds normal with good air movement. Abdominal: Soft. There is no tenderness. There is no rebound and no guarding.  Musculoskeletal: Normal range of motion.  Neurological: Alert, no facial droop, fluent speech, moves all extremities symmetrically Skin: Skin is warm and dry.  Psychiatric: Cooperative  ED Course  Procedures (including critical care time) Labs Review Labs Reviewed  COMPREHENSIVE METABOLIC PANEL - Abnormal; Notable for the following:    ALT 62 (*)    All other components within normal limits  CBC WITH DIFFERENTIAL/PLATELET  LIPASE, BLOOD  I-STAT TROPOININ, ED  I-STAT TROPOININ, ED    Imaging Review Dg Chest 2 View  08/17/2015  CLINICAL DATA:  Upper respiratory infection for 6 days developing chest pain EXAM: CHEST  2 VIEW COMPARISON:  02/09/2013 FINDINGS: Two lead pacer stable as is mild cardiac enlargement. Vascular pattern normal. Mild bronchitic change and mild bilateral mid to lower lung zone interstitial prominence not significantly different from prior study. IMPRESSION: No acute findings. Stable bronchitic change and mild nonspecific interstitial lung disease. Electronically Signed   By: Skipper Cliche M.D.   On: 08/17/2015 10:00   I have personally reviewed and evaluated these images and lab results as part of my medical decision-making.   EKG Interpretation   Date/Time:  Friday August 17 2015 13:44:23 EST Ventricular Rate:  71 PR Interval:  222 QRS Duration: 151 QT Interval:  449 QTC Calculation: 488 R Axis:   -83 Text Interpretation:  Atrial-sensed  ventricular-paced rhythm No further  analysis attempted due to paced rhythm No significant change since last  tracing Confirmed by Kemiah Booz MD, Almadelia Looman [id number]) on 08/17/2015 2:06:06 PM      MDM   Final diagnoses:  Chest wall pain  Cough    55 year old female who presents with cough and chest pain. On presentation is well-appearing and in no acute distress. Vital signs are non-concerning. Cardiopulmonary exam is unremarkable and remainder of exam nonfocal. EKG reveals ventricularly paced rhythm without concerns for acute ischemia or strain. Serial troponins are negative and repeat EKG without dynamic changes. Chest x-ray showing no infiltrate or other acute cardiopulmonary processes. She is stable bronchitic changes. Chest pain does not seem related to ACS as  has been constant, worse with coughing and movement. Seems more musculoskeletal in the setting of her cough. not related to exertional activities. Heart score 3, and she  Is felt adequately ruled out. No tachycardia, dyspnea, and no PE risk factors. Presentation not  felt to be consistent with that of PE or other serious acute intrathoracic processes. She has follow-up appointment in one week with cardiologist, which she is encouraged to continue. We'll follow up closely with her PCP next week as well. Strict return and follow-up instructions are reviewed. She expressed understanding of all discharge instructions and felt comfortable with the plan of care.   Forde Dandy, MD 08/17/15 [phone]

## 2015-08-17 NOTE — ED Notes (Signed)
In for blood draw, pt again demanding to know why she is still here.  Basically repeated prior conversation and advised we are drawing blood for a repeat test.

## 2015-08-17 NOTE — Telephone Encounter (Signed)
Received call from patient. She states recent URI, put on AB's by PCP. Has some SOB but thinks assoc w/ the illness. Pt has had persistent cough. She voices concern over CP that has been persistent since last night. Notes on left side of chest.   Pt concerned about CP -wanted to be seen for evaluation. Pain is persistent, continuous. Informed pt if CP seems it was brought on w/ illness and she notes improvement of discomfort by rubbing chest, etc - likely not cardiac. However, if she is unable to determine or unable to denote any relief she should go to ED to r/o cardiac concerns. Explained rationale for ED vs clinic for these symptoms.

## 2015-08-17 NOTE — Telephone Encounter (Signed)
New Message  Pt c/o of Chest Pain: 1. Are you having CP right now? Yes *(it is getting constant a lot worse when she coughs on the left side it feels like it is her heart that hurts  2. Are you experiencing any other symptoms (ex. SOB, nausea, vomiting, sweating)? Sweating and pain in the center of her abdomen 3. How long have you been experiencing CP? Started on 08/15/2015 4. Is your CP continuous or coming and going? Continuous 9 Her concern is up respiratory infection is there anything else going on. Extre exahtion.  5. Have you taken Nitroglycerin? No

## 2015-08-17 NOTE — ED Notes (Signed)
Pt back to room E40.

## 2015-08-17 NOTE — ED Notes (Signed)
Pt advises she is "stepping out" to make a phone call", I showed her the phone in the room.  Pt states she is going "out and not wearing any monitors".  I unhooked pt and d/c'd IV per pt request and walked out.  Pt stated "I'll be back in a minute to get my results"

## 2015-08-17 NOTE — ED Notes (Signed)
PT has been sick with bronchitis and URI; took zpack and thought just it was due to URI, called cardiologist and was told to come here to make sure just pleurisy. Worse with cough, palpate it. Constant pain 4/10.

## 2015-08-17 NOTE — Telephone Encounter (Signed)
Rx(s) sent to pharmacy electronically.  

## 2015-08-17 NOTE — Discharge Instructions (Signed)
Stop smoking during your current illness as it is making your symptoms worse. Return for worsening symptoms, including worsening pain, difficulty breathing, passing out or any other symptoms conerning to you. Finish antibiotics and use inhaler every 4 hours as needed for shortness of breath.  Chest Wall Pain Chest wall pain is pain in or around the bones and muscles of your chest. Sometimes, an injury causes this pain. Sometimes, the cause may not be known. This pain may take several weeks or longer to get better. HOME CARE Pay attention to any changes in your symptoms. Take these actions to help with your pain:  Rest as told by your doctor.  Avoid activities that cause pain. Try not to use your chest, belly (abdominal), or side muscles to lift heavy things.  If directed, apply ice to the painful area:  Put ice in a plastic bag.  Place a towel between your skin and the bag.  Leave the ice on for 20 minutes, 2-3 times per day.  Take over-the-counter and prescription medicines only as told by your doctor.  Do not use tobacco products, including cigarettes, chewing tobacco, and e-cigarettes. If you need help quitting, ask your doctor.  Keep all follow-up visits as told by your doctor. This is important. GET HELP IF:  You have a fever.  Your chest pain gets worse.  You have new symptoms. GET HELP RIGHT AWAY IF:  You feel sick to your stomach (nauseous) or you throw up (vomit).  You feel sweaty or light-headed.  You have a cough with phlegm (sputum) or you cough up blood.  You are short of breath.   This information is not intended to replace advice given to you by your health care provider. Make sure you discuss any questions you have with your health care provider.   Document Released: 12/10/2007 Document Revised: 03/14/2015 Document Reviewed: 09/18/2014 Elsevier Interactive Patient Education 2016 Elsevier Inc.  Cough, Adult A cough helps to clear your throat and lungs. A  cough may last only 2-3 weeks (acute), or it may last longer than 8 weeks (chronic). Many different things can cause a cough. A cough may be a sign of an illness or another medical condition. HOME CARE  Pay attention to any changes in your cough.  Take medicines only as told by your doctor.  If you were prescribed an antibiotic medicine, take it as told by your doctor. Do not stop taking it even if you start to feel better.  Talk with your doctor before you try using a cough medicine.  Drink enough fluid to keep your pee (urine) clear or pale yellow.  If the air is dry, use a cold steam vaporizer or humidifier in your home.  Stay away from things that make you cough at work or at home.  If your cough is worse at night, try using extra pillows to raise your head up higher while you sleep.  Do not smoke, and try not to be around smoke. If you need help quitting, ask your doctor.  Do not have caffeine.  Do not drink alcohol.  Rest as needed. GET HELP IF:  You have new problems (symptoms).  You cough up yellow fluid (pus).  Your cough does not get better after 2-3 weeks, or your cough gets worse.  Medicine does not help your cough and you are not sleeping well.  You have pain that gets worse or pain that is not helped with medicine.  You have a fever.  You  are losing weight and you do not know why.  You have night sweats. GET HELP RIGHT AWAY IF:  You cough up blood.  You have trouble breathing.  Your heartbeat is very fast.   This information is not intended to replace advice given to you by your health care provider. Make sure you discuss any questions you have with your health care provider.   Document Released: 03/06/2011 Document Revised: 03/14/2015 Document Reviewed: 08/30/2014 Elsevier Interactive Patient Education 2016 Elsevier Inc.  Nonspecific Chest Pain It is often hard to find the cause of chest pain. There is always a chance that your pain could be  related to something serious, such as a heart attack or a blood clot in your lungs. Chest pain can also be caused by conditions that are not life-threatening. If you have chest pain, it is very important to follow up with your doctor.  HOME CARE  If you were prescribed an antibiotic medicine, finish it all even if you start to feel better.  Avoid any activities that cause chest pain.  Do not use any tobacco products, including cigarettes, chewing tobacco, or electronic cigarettes. If you need help quitting, ask your doctor.  Do not drink alcohol.  Take medicines only as told by your doctor.  Keep all follow-up visits as told by your doctor. This is important. This includes any further testing if your chest pain does not go away.  Your doctor may tell you to keep your head raised (elevated) while you sleep.  Make lifestyle changes as told by your doctor. These may include:  Getting regular exercise. Ask your doctor to suggest some activities that are safe for you.  Eating a heart-healthy diet. Your doctor or a diet specialist (dietitian) can help you to learn healthy eating options.  Maintaining a healthy weight.  Managing diabetes, if necessary.  Reducing stress. GET HELP IF:  Your chest pain does not go away, even after treatment.  You have a rash with blisters on your chest.  You have a fever. GET HELP RIGHT AWAY IF:  Your chest pain is worse.  You have an increasing cough, or you cough up blood.  You have severe belly (abdominal) pain.  You feel extremely weak.  You pass out (faint).  You have chills.  You have sudden, unexplained chest discomfort.  You have sudden, unexplained discomfort in your arms, back, neck, or jaw.  You have shortness of breath at any time.  You suddenly start to sweat, or your skin gets clammy.  You feel nauseous.  You vomit.  You suddenly feel light-headed or dizzy.  Your heart begins to beat quickly, or it feels like it is  skipping beats. These symptoms may be an emergency. Do not wait to see if the symptoms will go away. Get medical help right away. Call your local emergency services (911 in the [place] Do not drive yourself to the hospital.   This information is not intended to replace advice given to you by your health care provider. Make sure you discuss any questions you have with your health care provider.   Document Released: 12/10/2007 Document Revised: 07/14/2014 Document Reviewed: 01/27/2014 Elsevier Interactive Patient Education [employer].

## 2015-08-17 NOTE — ED Notes (Signed)
Pt demanding to know "why it's taking so long".  Advises she has been here "3 hours and there is no reason for it".  Tried to explain when all tests are resulted, MD would be back in for update with plan of care.

## 2015-08-28 ENCOUNTER — Encounter: Payer: Self-pay | Admitting: Cardiovascular Disease

## 2015-08-28 ENCOUNTER — Ambulatory Visit (INDEPENDENT_AMBULATORY_CARE_PROVIDER_SITE_OTHER): Payer: 59 | Admitting: Cardiovascular Disease

## 2015-08-28 ENCOUNTER — Other Ambulatory Visit: Payer: Self-pay | Admitting: Cardiovascular Disease

## 2015-08-28 VITALS — BP 142/84 | HR 72 | Ht 66.75 in | Wt 212.1 lb

## 2015-08-28 DIAGNOSIS — I442 Atrioventricular block, complete: Secondary | ICD-10-CM

## 2015-08-28 DIAGNOSIS — E669 Obesity, unspecified: Secondary | ICD-10-CM

## 2015-08-28 DIAGNOSIS — Z950 Presence of cardiac pacemaker: Secondary | ICD-10-CM | POA: Diagnosis not present

## 2015-08-28 DIAGNOSIS — Z720 Tobacco use: Secondary | ICD-10-CM

## 2015-08-28 DIAGNOSIS — E66811 Obesity, class 1: Secondary | ICD-10-CM

## 2015-08-28 DIAGNOSIS — I10 Essential (primary) hypertension: Secondary | ICD-10-CM | POA: Diagnosis not present

## 2015-08-28 NOTE — Patient Instructions (Signed)
Remote monitoring is used to monitor your Pacemaker from home. This monitoring reduces the number of office visits required to check your device to one time per year. It allows [place] to monitor the functioning of your device to ensure it is working properly. You are scheduled for a device check from home on Nov 26, 2015. You may send your transmission at any time that day. If you have a wireless device, the transmission will be sent automatically. After your physician reviews your transmission, you will receive a postcard with your next transmission date.  Dr. Sallyanne Kuster recommends that you schedule a follow-up appointment in: [place] ([place])

## 2015-08-28 NOTE — Progress Notes (Signed)
Patient ID: Mandy Sellers, female   DOB: 1960/11/12, 55 y.o.   MRN: [id number]    Cardiology Office Note    Date:  08/28/2015   ID:  Mandy Sellers, DOB September 02, 1960, MRN [id number]  PCP:  Irven Shelling, MD  Cardiologist:   Sanda Klein, MD   Chief Complaint  Patient presents with  . Annual Exam    chest pain-having pain for about a week, occassional shortness of breath, no edema, no pain or cramping in legs,occassional lightheadedness or dizziness    History of Present Illness:  Mandy Sellers is a 55 y.o. female with complete heart block here for a pacemaker check (she does have a very slow ventricular escape rhythm at about 35 bpm, but feels poorly when she has ventricular pacing threshold checks). No new health problems, no known structural heart disease. Unfortunately still smokes. No CV complaints.  Pacemaker interrogation shows normal device function with an estimated generator longevity of greater than 9 years, 100% atrial sensed ventricular paced rhythm with normal heart rate histograms and no recorded episodes of meaningful arrhythmia, other than an isolated and asymptomatic 7-beat run of NSVT last November, at night. Lead parameters are excellent and unchanged from historical values. Generator longevity 9 years.  Past Medical History  Diagnosis Date  . Hypertension   . Depression   . Pacemaker     dual-chmber for CHB in 2010  . Tobacco abuse   . CHB (complete heart block) ([place]) 08/09/2013  . Obesity (BMI 30.0-34.9) 08/09/2013  . HTN (hypertension) 08/09/2013    Past Surgical History  Procedure Laterality Date  . Pacemaker insertion  03/30/2009    for CHB; Medtronic Adapta ADDRL1 - Dr. Norlene Duel  . Incontinence surgery  03/2000  . Tubal ligation  03/2000  . Transthoracic echocardiogram  09/2010    EF=>55%; mild MR; trace pulm valve regurg   . Nm myocar perf wall motion  05/2009    bruce myoview; normal pattern of perfusion in all regions, low risk scan  .  Insert / replace / remove pacemaker    . Dilatation & currettage/hysteroscopy with resectocope N/A 11/10/2013    Procedure: DILATATION & CURETTAGE/HYSTEROSCOPY WITH MYOSURE;  Surgeon: Darlyn Chamber, MD;  Location: [facility];  Service: Gynecology;  Laterality: N/A;    Outpatient Prescriptions Prior to Visit  Medication Sig Dispense Refill  . aspirin EC 81 MG tablet Take 81 mg by mouth at bedtime.    . B Complex Vitamins (VITAMIN B COMPLEX) TABS Take 1 tablet by mouth daily.    Marland Kitchen BYSTOLIC 10 MG tablet Take 1 tablet by mouth  daily 90 tablet 0  . Cholecalciferol (VITAMIN D-3) 1000 UNITS CAPS Take 1,000 Units by mouth daily.    . hydrochlorothiazide (MICROZIDE) 12.5 MG capsule Take 1 capsule (12.5 mg total) by mouth daily. 90 capsule 0  . Multiple Vitamins-Minerals (AIRBORNE) CHEW Chew 1 tablet by mouth daily.    . Omega-3 Fatty Acids (FISH OIL) 1000 MG CAPS Take 2,000 mg by mouth daily.    Marland Kitchen venlafaxine XR (EFFEXOR-XR) 75 MG 24 hr capsule Take 75 mg by mouth every evening.    . Multiple Vitamin (MULTIVITAMIN WITH MINERALS) TABS tablet Take 1 tablet by mouth daily.    Marland Kitchen azithromycin (ZITHROMAX) 250 MG tablet Take 1 tablet by mouth daily. Reported on 08/28/2015     No facility-administered medications prior to visit.     Allergies:   Varenicline   Social History   Social History  . Marital  Status: Married    Spouse Name: N/A  . Number of Children: 1  . Years of Education: master's   Occupational History  .  [place] History Main Topics  . Smoking status: Heavy Tobacco Smoker -- 1.00 packs/day for 30 years    Types: Cigarettes  . Smokeless tobacco: Never Used  . Alcohol Use: Yes     Comment: social  . Drug Use: No  . Sexual Activity: Yes   Other Topics Concern  . None   Social History Narrative     Family History:  The patient's family history includes Alcohol abuse in her father; Deep vein thrombosis in her brother; Diabetes in her father and mother; Hypertension  in her father; Stroke in her father.   ROS:   Please see the history of present illness.    ROS All other systems reviewed and are negative.   PHYSICAL EXAM:   VS:  BP 142/84 mmHg  Pulse 72  Ht 5' 6.75" (1.695 m)  Wt 96.219 kg (212 lb 2 oz)  BMI 33.49 kg/m2  LMP 06/15/2013   GEN: Well nourished, well developed, in no acute distress HEENT: normal Neck: no JVD, carotid bruits, or masses Cardiac: RRR, paradoxically split; no murmurs, rubs, or gallops,no edema ; healthy left subclavian PPM site Respiratory:  clear to auscultation bilaterally, normal work of breathing GI: soft, nontender, nondistended, + BS MS: no deformity or atrophy Skin: warm and dry, no rash Neuro:  Alert and Oriented x 3, Strength and sensation are intact Psych: euthymic mood, full affect  Wt Readings from Last 3 Encounters:  08/28/15 96.219 kg (212 lb 2 oz)  08/15/14 97.841 kg (215 lb 11.2 oz)  02/07/14 95.255 kg (210 lb)      Studies/Labs Reviewed:   EKG:  EKG is ordered today.  The ekg ordered today demonstrates A paced V sensed rhythm, +ve R wave in V1-V2  Recent Labs: 08/17/2015: ALT 62*; BUN 12; Creatinine, Ser 0.68; Hemoglobin 14.5; Platelets 196; Potassium 3.9; Sodium 139   Lipid Panel    Component Value Date/Time   CHOL  03/29/2009 0730    144        ATP III CLASSIFICATION:  <200     mg/dL   Desirable  200-239  mg/dL   Borderline High  >=240    mg/dL   High          TRIG 123 03/29/2009 0730   HDL 50 03/29/2009 0730   CHOLHDL 2.9 03/29/2009 0730   VLDL 25 03/29/2009 0730   LDLCALC  03/29/2009 0730    69        Total Cholesterol/HDL:CHD Risk Coronary Heart Disease Risk Table                     Men   Women  1/2 Average Risk   3.4   3.3  Average Risk       5.0   4.4  2 X Average Risk   9.6   7.1  3 X Average Risk  23.4   11.0        Use the calculated Patient Ratio above and the CHD Risk Table to determine the patient's CHD Risk.        ATP III CLASSIFICATION (LDL):  <100      mg/dL   Optimal  100-129  mg/dL   Near or Above  Optimal  130-159  mg/dL   Borderline  160-189  mg/dL   High  >190     mg/dL   Very High    Additional studies/ records that were reviewed today include:  CT abd and pelvis 02/08/2014   ASSESSMENT:    1. CHB (complete heart block) ([place])   2. Pacemaker - dual chamber Medtronic 2010   3. Essential hypertension   4. Obesity (BMI 30.0-34.9)   5. Tobacco abuse      PLAN:  In order of problems listed above:  1. Should be considered pacemaker dependent 2. All lead parameters are excellent, but review of the imaging studies (not so clear on CXR, but clear on CT abdomen) shows that the RV lead was inserted into the middle cardiac vein, rather than the RV apex. This should not be an issue unless she will require CRT in the future. Carelink Q3 mos, office visit yearly. 3. Rechecked BP 137/77 mm Hg 4. Weight loss encouraged 5. Smoking cessation recommended.    Medication Adjustments/Labs and Tests Ordered: Current medicines are reviewed at length with the patient today.  Concerns regarding medicines are outlined above.  Medication changes, Labs and Tests ordered today are listed in the Patient Instructions below. There are no Patient Instructions on file for this visit.     Mikael Spray, MD  08/28/2015 9:52 AM    [facility] [place], [place], [place]  [address] Phone: [phone]; Fax: [phone]

## 2015-08-28 NOTE — Telephone Encounter (Signed)
Rx request sent to pharmacy.  

## 2015-09-13 ENCOUNTER — Other Ambulatory Visit: Payer: Self-pay | Admitting: Cardiovascular Disease

## 2015-09-13 NOTE — Telephone Encounter (Signed)
Rx(s) sent to pharmacy electronically.  

## 2015-12-07 ENCOUNTER — Ambulatory Visit (INDEPENDENT_AMBULATORY_CARE_PROVIDER_SITE_OTHER): Payer: 59 | Admitting: *Deleted

## 2015-12-07 DIAGNOSIS — I442 Atrioventricular block, complete: Secondary | ICD-10-CM

## 2015-12-07 NOTE — Progress Notes (Signed)
Remote pacemaker transmission.   

## 2015-12-12 ENCOUNTER — Encounter: Payer: Self-pay | Admitting: Cardiology

## 2015-12-18 ENCOUNTER — Other Ambulatory Visit: Payer: Self-pay | Admitting: Cardiovascular Disease

## 2015-12-18 NOTE — Telephone Encounter (Signed)
Rx Refill

## 2015-12-20 LAB — CUP PACEART REMOTE DEVICE CHECK
Battery Impedance: 421 Ohm
Brady Statistic AP VP Percent: 1 %
Brady Statistic AP VS Percent: 0 %
Brady Statistic AS VP Percent: 99 %
Brady Statistic AS VS Percent: 0 %
Date Time Interrogation Session: 20170602095000
Implantable Lead Implant Date: 20100924
Implantable Lead Model: 5076
Implantable Lead Model: 5076
Lead Channel Impedance Value: 494 Ohm
Lead Channel Setting Pacing Amplitude: 2 V
Lead Channel Setting Pacing Pulse Width: 0.4 ms
Lead Channel Setting Sensing Sensitivity: 4 mV
MDC IDC LEAD IMPLANT DT: 20100924
MDC IDC LEAD LOCATION: 753859
MDC IDC LEAD LOCATION: 753860
MDC IDC MSMT BATTERY REMAINING LONGEVITY: 101 mo
MDC IDC MSMT BATTERY VOLTAGE: 2.79 V
MDC IDC MSMT LEADCHNL RV IMPEDANCE VALUE: 654 Ohm
MDC IDC SET LEADCHNL RA PACING AMPLITUDE: 1.5 V

## 2015-12-26 ENCOUNTER — Encounter: Payer: Self-pay | Admitting: Cardiology

## 2015-12-28 ENCOUNTER — Encounter: Payer: Self-pay | Admitting: Cardiology

## 2016-01-10 ENCOUNTER — Encounter: Payer: Self-pay | Admitting: Cardiology

## 2016-01-28 ENCOUNTER — Other Ambulatory Visit: Payer: Self-pay | Admitting: Nurse Practitioner

## 2016-01-28 ENCOUNTER — Ambulatory Visit
Admission: RE | Admit: 2016-01-28 | Discharge: 2016-01-28 | Disposition: A | Discharge status: Home / Self Care | Payer: 59 | Source: Ambulatory Visit | Attending: Nurse Practitioner | Admitting: Nurse Practitioner

## 2016-01-28 DIAGNOSIS — R1032 Left lower quadrant pain: Secondary | ICD-10-CM

## 2016-01-28 DIAGNOSIS — R1084 Generalized abdominal pain: Secondary | ICD-10-CM

## 2016-01-28 MED ORDER — IOPAMIDOL (ISOVUE-300) INJECTION 61%
125.0000 mL | Freq: Once | INTRAVENOUS | Status: AC | PRN
  Administered 2016-01-28: 125 mL via INTRAVENOUS

## 2016-03-04 ENCOUNTER — Other Ambulatory Visit: Payer: Self-pay | Admitting: Orthopedic Surgery

## 2016-03-04 DIAGNOSIS — M25562 Pain in left knee: Secondary | ICD-10-CM

## 2016-03-07 ENCOUNTER — Other Ambulatory Visit: Payer: Self-pay | Admitting: Cardiovascular Disease

## 2016-03-11 ENCOUNTER — Ambulatory Visit (INDEPENDENT_AMBULATORY_CARE_PROVIDER_SITE_OTHER): Payer: 59 | Admitting: *Deleted

## 2016-03-11 ENCOUNTER — Telehealth: Payer: Self-pay | Admitting: Cardiology

## 2016-03-11 DIAGNOSIS — I442 Atrioventricular block, complete: Secondary | ICD-10-CM

## 2016-03-11 DIAGNOSIS — Z950 Presence of cardiac pacemaker: Secondary | ICD-10-CM

## 2016-03-11 NOTE — Telephone Encounter (Signed)
Rx(s) sent to pharmacy electronically.  

## 2016-03-11 NOTE — Telephone Encounter (Signed)
Spoke with pt and reminded pt of remote transmission that is due today. Pt verbalized understanding.   

## 2016-03-11 NOTE — Progress Notes (Signed)
Remote pacemaker transmission.   

## 2016-03-12 LAB — CUP PACEART REMOTE DEVICE CHECK
Battery Impedance: 470 Ohm
Brady Statistic AP VS Percent: 0 %
Brady Statistic AS VS Percent: 0 %
Implantable Lead Implant Date: 20100924
Implantable Lead Location: 753860
Lead Channel Impedance Value: 508 Ohm
Lead Channel Impedance Value: 647 Ohm
Lead Channel Pacing Threshold Pulse Width: 0.4 ms
Lead Channel Setting Pacing Amplitude: 1.5 V
Lead Channel Setting Sensing Sensitivity: 4 mV
MDC IDC LEAD IMPLANT DT: 20100924
MDC IDC LEAD LOCATION: 753859
MDC IDC MSMT BATTERY REMAINING LONGEVITY: 97 mo
MDC IDC MSMT BATTERY VOLTAGE: 2.79 V
MDC IDC MSMT LEADCHNL RA PACING THRESHOLD AMPLITUDE: 0.375 V
MDC IDC MSMT LEADCHNL RA SENSING INTR AMPL: 2.8 mV
MDC IDC MSMT LEADCHNL RV PACING THRESHOLD AMPLITUDE: 0.625 V
MDC IDC MSMT LEADCHNL RV PACING THRESHOLD PULSEWIDTH: 0.4 ms
MDC IDC SESS DTM: 20170905135628
MDC IDC SET LEADCHNL RV PACING AMPLITUDE: 2 V
MDC IDC SET LEADCHNL RV PACING PULSEWIDTH: 0.4 ms
MDC IDC STAT BRADY AP VP PERCENT: 1 %
MDC IDC STAT BRADY AS VP PERCENT: 99 %

## 2016-03-14 ENCOUNTER — Encounter: Payer: Self-pay | Admitting: Cardiology

## 2016-03-14 ENCOUNTER — Ambulatory Visit
Admission: RE | Admit: 2016-03-14 | Discharge: 2016-03-14 | Disposition: A | Discharge status: Home / Self Care | Payer: 59 | Source: Ambulatory Visit | Attending: Orthopedic Surgery | Admitting: Orthopedic Surgery

## 2016-03-14 DIAGNOSIS — M25562 Pain in left knee: Secondary | ICD-10-CM

## 2016-03-14 MED ORDER — IOPAMIDOL (ISOVUE-M 200) INJECTION 41%
40.0000 mL | Freq: Once | INTRAMUSCULAR | Status: AC
  Administered 2016-03-14: 40 mL via INTRA_ARTICULAR

## 2016-05-13 ENCOUNTER — Other Ambulatory Visit: Payer: Self-pay | Admitting: Internal Medicine

## 2016-05-13 DIAGNOSIS — F17200 Nicotine dependence, unspecified, uncomplicated: Secondary | ICD-10-CM

## 2016-05-23 ENCOUNTER — Ambulatory Visit
Admission: RE | Admit: 2016-05-23 | Discharge: 2016-05-23 | Disposition: A | Discharge status: Home / Self Care | Payer: 59 | Source: Ambulatory Visit | Attending: Internal Medicine | Admitting: Internal Medicine

## 2016-05-23 DIAGNOSIS — F17200 Nicotine dependence, unspecified, uncomplicated: Secondary | ICD-10-CM

## 2016-06-10 ENCOUNTER — Ambulatory Visit (INDEPENDENT_AMBULATORY_CARE_PROVIDER_SITE_OTHER): Payer: 59 | Admitting: *Deleted

## 2016-06-10 ENCOUNTER — Telehealth: Payer: Self-pay | Admitting: Cardiology

## 2016-06-10 DIAGNOSIS — I442 Atrioventricular block, complete: Secondary | ICD-10-CM

## 2016-06-10 NOTE — Progress Notes (Signed)
Remote pacemaker transmission.   

## 2016-06-10 NOTE — Telephone Encounter (Signed)
LMOVM reminding pt to send remote transmission.   

## 2016-06-15 ENCOUNTER — Emergency Department (HOSPITAL_COMMUNITY)
Admission: EM | Admit: 2016-06-15 | Discharge: 2016-06-15 | Disposition: A | Discharge status: Home / Self Care | Payer: 59 | Attending: Emergency Medicine | Admitting: Emergency Medicine

## 2016-06-15 ENCOUNTER — Encounter (HOSPITAL_COMMUNITY): Payer: Self-pay

## 2016-06-15 DIAGNOSIS — I10 Essential (primary) hypertension: Secondary | ICD-10-CM | POA: Insufficient documentation

## 2016-06-15 DIAGNOSIS — R51 Headache: Secondary | ICD-10-CM | POA: Diagnosis present

## 2016-06-15 DIAGNOSIS — Z79899 Other long term (current) drug therapy: Secondary | ICD-10-CM | POA: Insufficient documentation

## 2016-06-15 DIAGNOSIS — F17210 Nicotine dependence, cigarettes, uncomplicated: Secondary | ICD-10-CM | POA: Insufficient documentation

## 2016-06-15 DIAGNOSIS — J320 Chronic maxillary sinusitis: Secondary | ICD-10-CM | POA: Diagnosis not present

## 2016-06-15 DIAGNOSIS — Z950 Presence of cardiac pacemaker: Secondary | ICD-10-CM | POA: Insufficient documentation

## 2016-06-15 MED ORDER — FLUTICASONE PROPIONATE 50 MCG/ACT NA SUSP: 2.0000 | Freq: Every day | NASAL | 0 refills | Status: DC

## 2016-06-15 MED ORDER — PSEUDOEPHEDRINE HCL 60 MG PO TABS: 60.0000 mg | ORAL_TABLET | Freq: Four times a day (QID) | ORAL | 0 refills | Status: DC | PRN

## 2016-06-15 MED ORDER — IBUPROFEN 800 MG PO TABS: 800.0000 mg | ORAL_TABLET | Freq: Three times a day (TID) | ORAL | 0 refills | Status: DC | PRN

## 2016-06-15 MED ORDER — HYDROCODONE-ACETAMINOPHEN 5-325 MG PO TABS: 1.0000 | ORAL_TABLET | ORAL | 0 refills | Status: DC | PRN

## 2016-06-15 MED ORDER — AMOXICILLIN-POT CLAVULANATE 875-125 MG PO TABS: 1.0000 | ORAL_TABLET | Freq: Two times a day (BID) | ORAL | 0 refills | Status: DC

## 2016-06-15 NOTE — ED Triage Notes (Signed)
Pt c/o intermittent R side facial/dental/jaw pain since "before Thanksgiving."  Pain score 9/10.  Pt reports taking OTC medications w/o relief.  Pt was seen by dentist on 11/27 and told that dental xrays were negative.

## 2016-06-15 NOTE — ED Notes (Signed)
Bed: WTR8 Expected date:  Expected time:  Means of arrival:  Comments: 

## 2016-06-15 NOTE — ED Notes (Signed)
PT DISCHARGED. INSTRUCTIONS AND PRESCRIPTIONS GIVEN. AAOX4. PT IN NO APPARENT DISTRESS. THE OPPORTUNITY TO ASK QUESTIONS WAS PROVIDED. 

## 2016-06-15 NOTE — Discharge Instructions (Signed)
Read the information below.  Use the prescribed medication as directed.  Please discuss all new medications with your pharmacist.  Do not take additional tylenol while taking the prescribed pain medication to avoid overdose.  You may return to the Emergency Department at any time for worsening condition or any new symptoms that concern you.    If you develop fevers, swelling in your face, difficulty swallowing or breathing, return to the ER immediately for a recheck.

## 2016-06-15 NOTE — ED Provider Notes (Signed)
[facility] DEPT Provider Note    By signing my name below, I, Bea Graff, attest that this documentation has been prepared under the direction and in the presence of [facility], PA-C. Electronically Signed: Bea Graff, ED Scribe. 06/15/16. 11:08 AM.   History   Chief Complaint Chief Complaint  Patient presents with  . Dental Pain  . Facial Pain    The history is provided by the patient and medical records. No language interpreter was used.    HPI Comments:  Mandy Sellers is an obese 55 y.o. female who presents to the Emergency Department complaining of upper right sided dental pain that began about two weeks ago. She states the pain starts in right cheek and radiates to her right ear. She reports associated right sided facial swelling. She states she saw her dentist and had negative X-Rays. She states the dentist believes she clenches/grinds her teeth which is causing the pain. Pt reports the pain resolved for a while but returned about three days ago. She did one sinus wash that initially seemed to help.  Pt reports taking Vicodin and Aleve with some relief. She denies modifying factors. She denies fever, chills, nausea, vomiting, rhinorrhea, sore throat, drainage from teeth or ear, difficulty swallowing or breathing. She lives in a house with a lot of smokers.  Past Medical History:  Diagnosis Date  . CHB (complete heart block) ([place]) 08/09/2013  . Depression   . HTN (hypertension) 08/09/2013  . Hypertension   . Obesity (BMI 30.0-34.9) 08/09/2013  . Pacemaker    dual-chmber for CHB in 2010  . Tobacco abuse     Patient Active Problem List   Diagnosis Date Noted  . Endometrial thickening on ultra sound 11/10/2013    Class: Present on Admission  . Status post hysteroscopy 11/10/2013    Class: Status post  . CHB (complete heart block) ([place]) 08/09/2013  . Pacemaker - dual chamber Medtronic 2010 08/09/2013  . Tobacco abuse 08/09/2013  . Obesity (BMI 30.0-34.9)  08/09/2013  . HTN (hypertension) 08/09/2013    Past Surgical History:  Procedure Laterality Date  . DILATATION & CURRETTAGE/HYSTEROSCOPY WITH RESECTOCOPE N/A 11/10/2013   Procedure: DILATATION & CURETTAGE/HYSTEROSCOPY WITH MYOSURE;  Surgeon: Darlyn Chamber, MD;  Location: [facility];  Service: Gynecology;  Laterality: N/A;  . INCONTINENCE SURGERY  03/2000  . INSERT / REPLACE / REMOVE PACEMAKER    . NM MYOCAR PERF WALL MOTION  05/2009   bruce myoview; normal pattern of perfusion in all regions, low risk scan  . PACEMAKER INSERTION  03/30/2009   for CHB; Medtronic Adapta ADDRL1 - Dr. Norlene Duel  . TRANSTHORACIC ECHOCARDIOGRAM  09/2010   EF=>55%; mild MR; trace pulm valve regurg   . TUBAL LIGATION  03/2000    OB History    No data available       Home Medications    Prior to Admission medications   Medication Sig Start Date End Date Taking? Authorizing Provider  amoxicillin-clavulanate (AUGMENTIN) 875-125 MG tablet Take 1 tablet by mouth every 12 (twelve) hours. 06/15/16   Clayton Bibles, PA-C  aspirin EC 81 MG tablet Take 81 mg by mouth at bedtime.    Historical Provider, MD  B Complex Vitamins (VITAMIN B COMPLEX) TABS Take 1 tablet by mouth daily.    Historical Provider, MD  BYSTOLIC 10 MG tablet Take 1 tablet by mouth  daily 03/11/16   Sanda Klein, MD  Cholecalciferol (VITAMIN D-3) 1000 UNITS CAPS Take 1,000 Units by mouth daily.  Historical Provider, MD  fluticasone (FLONASE) 50 MCG/ACT nasal spray Place 2 sprays into both nostrils daily. 06/15/16   Clayton Bibles, PA-C  hydrochlorothiazide (MICROZIDE) 12.5 MG capsule Take 1 capsule by mouth  daily 09/13/15   Sanda Klein, MD  HYDROcodone-acetaminophen (NORCO/VICODIN) 5-325 MG tablet Take 1-2 tablets by mouth every 4 (four) hours as needed for moderate pain or severe pain. 06/15/16   Clayton Bibles, PA-C  ibuprofen (ADVIL,MOTRIN) 800 MG tablet Take 1 tablet (800 mg total) by mouth every 8 (eight) hours as needed for mild pain or moderate pain.  06/15/16   Clayton Bibles, PA-C  Multiple Vitamin (MULTIVITAMIN WITH MINERALS) TABS tablet Take 1 tablet by mouth daily.    Historical Provider, MD  Multiple Vitamins-Minerals (AIRBORNE) CHEW Chew 1 tablet by mouth daily.    Historical Provider, MD  Omega-3 Fatty Acids (FISH OIL) 1000 MG CAPS Take 2,000 mg by mouth daily.    Historical Provider, MD  pseudoephedrine (SUDAFED) 60 MG tablet Take 1 tablet (60 mg total) by mouth every 6 (six) hours as needed for congestion. 06/15/16   Clayton Bibles, PA-C  venlafaxine XR (EFFEXOR-XR) 75 MG 24 hr capsule Take 75 mg by mouth every evening.    Historical Provider, MD    Family History Family History  Problem Relation Age of Onset  . Diabetes Mother   . Diabetes Father   . Alcohol abuse Father   . Hypertension Father   . Stroke Father   . Deep vein thrombosis Brother     Social History Social History  Substance Use Topics  . Smoking status: Heavy Tobacco Smoker    Packs/day: 1.00    Years: 30.00    Types: Cigarettes  . Smokeless tobacco: Never Used  . Alcohol use Yes     Comment: social     Allergies   Varenicline   Review of Systems Review of Systems  Constitutional: Negative for chills and fever.  HENT: Positive for dental problem, ear pain and facial swelling. Negative for ear discharge, rhinorrhea, sore throat and trouble swallowing.   Respiratory: Negative for shortness of breath.   Gastrointestinal: Negative for nausea and vomiting.     Physical Exam Updated Vital Signs BP 170/90 (BP Location: Left Arm)   Pulse 72   Temp 98.3 F (36.8 C) (Oral)   Resp 16   LMP 06/15/2013   SpO2 93%   Physical Exam  Constitutional: She appears well-developed and well-nourished. No distress.  HENT:  Head: Normocephalic and atraumatic.  Mouth/Throat: Oropharynx is clear and moist. No oropharyngeal exudate.  Left upper molars all tender to percussion. Right maxillary sinus tender to percussion. Bilateral TMs normal.  Eyes: Conjunctivae  are normal.  Neck: Neck supple.  Cardiovascular: Normal rate.   Pulmonary/Chest: Effort normal.  Neurological: She is alert.  Skin: She is not diaphoretic.  Nursing note and vitals reviewed.    ED Treatments / Results  DIAGNOSTIC STUDIES: Oxygen Saturation is 93% on RA, low by my interpretation.   COORDINATION OF CARE: 10:58 AM- Will prescribe antibiotic to cover for sinus infection/dental infection. Advised pt continue sinus washes. Pt verbalizes understanding and agrees to plan.  Medications - No data to display  Labs (all labs ordered are listed, but only abnormal results are displayed) Labs Reviewed - No data to display  EKG  EKG Interpretation None       Radiology No results found.  Procedures Procedures (including critical care time)  Medications Ordered in ED Medications - No data to display  Initial Impression / Assessment and Plan / ED Course  I have reviewed the triage vital signs and the nursing notes.  Pertinent labs & imaging results that were available during my care of the patient were reviewed by me and considered in my medical decision making (see chart for details).  Clinical Course     Afebrile, nontoxic patient with right maxillary sinus pain and right upper dental pain.  Has been seen by dentistry and ruled out dental infection.  Given initial improvement with sinus wash then worsening after and intense pain, will treat as bacterial sinusitis.   D/C home with augmentin, sinusitis treatment, PCP follow up.  Discussed result, findings, treatment, and follow up  with patient.  Pt given return precautions.  Pt verbalizes understanding and agrees with plan.       I personally performed the services described in this documentation, which was scribed in my presence. The recorded information has been reviewed and is accurate.   Final Clinical Impressions(s) / ED Diagnoses   Final diagnoses:  Right maxillary sinusitis    New Prescriptions New  Prescriptions   AMOXICILLIN-CLAVULANATE (AUGMENTIN) 875-125 MG TABLET    Take 1 tablet by mouth every 12 (twelve) hours.   FLUTICASONE (FLONASE) 50 MCG/ACT NASAL SPRAY    Place 2 sprays into both nostrils daily.   HYDROCODONE-ACETAMINOPHEN (NORCO/VICODIN) 5-325 MG TABLET    Take 1-2 tablets by mouth every 4 (four) hours as needed for moderate pain or severe pain.   IBUPROFEN (ADVIL,MOTRIN) 800 MG TABLET    Take 1 tablet (800 mg total) by mouth every 8 (eight) hours as needed for mild pain or moderate pain.   PSEUDOEPHEDRINE (SUDAFED) 60 MG TABLET    Take 1 tablet (60 mg total) by mouth every 6 (six) hours as needed for congestion.     Clayton Bibles, PA-C 06/15/16 1245    Leo Grosser, MD 06/16/16 [phone]

## 2016-06-18 ENCOUNTER — Encounter: Payer: Self-pay | Admitting: Cardiology

## 2016-06-25 LAB — CUP PACEART REMOTE DEVICE CHECK
Battery Impedance: 494 Ohm
Battery Remaining Longevity: 95 mo
Battery Voltage: 2.79 V
Brady Statistic AP VP Percent: 1 %
Brady Statistic AP VS Percent: 0 %
Brady Statistic AS VP Percent: 99 %
Implantable Lead Location: 753860
Implantable Lead Model: 5076
Implantable Lead Model: 5076
Implantable Pulse Generator Implant Date: 20100924
Lead Channel Impedance Value: 481 Ohm
Lead Channel Pacing Threshold Amplitude: 0.375 V
Lead Channel Pacing Threshold Pulse Width: 0.4 ms
Lead Channel Pacing Threshold Pulse Width: 0.4 ms
Lead Channel Setting Pacing Amplitude: 1.5 V
Lead Channel Setting Pacing Amplitude: 2 V
Lead Channel Setting Pacing Pulse Width: 0.4 ms
MDC IDC LEAD IMPLANT DT: 20100924
MDC IDC LEAD IMPLANT DT: 20100924
MDC IDC LEAD LOCATION: 753859
MDC IDC MSMT LEADCHNL RV IMPEDANCE VALUE: 667 Ohm
MDC IDC MSMT LEADCHNL RV PACING THRESHOLD AMPLITUDE: 0.625 V
MDC IDC SESS DTM: 20171205121532
MDC IDC SET LEADCHNL RV SENSING SENSITIVITY: 4 mV
MDC IDC STAT BRADY AS VS PERCENT: 0 %

## 2016-07-02 ENCOUNTER — Encounter: Payer: Self-pay | Admitting: Cardiology

## 2016-08-26 DIAGNOSIS — H0015 Chalazion left lower eyelid: Secondary | ICD-10-CM | POA: Diagnosis not present

## 2016-09-03 ENCOUNTER — Ambulatory Visit (INDEPENDENT_AMBULATORY_CARE_PROVIDER_SITE_OTHER): Payer: 59 | Admitting: Cardiovascular Disease

## 2016-09-03 VITALS — BP 137/84 | HR 82 | Ht 66.0 in | Wt 213.0 lb

## 2016-09-03 DIAGNOSIS — Z950 Presence of cardiac pacemaker: Secondary | ICD-10-CM | POA: Diagnosis not present

## 2016-09-03 DIAGNOSIS — I10 Essential (primary) hypertension: Secondary | ICD-10-CM | POA: Diagnosis not present

## 2016-09-03 DIAGNOSIS — I442 Atrioventricular block, complete: Secondary | ICD-10-CM

## 2016-09-03 DIAGNOSIS — E669 Obesity, unspecified: Secondary | ICD-10-CM | POA: Diagnosis not present

## 2016-09-03 DIAGNOSIS — H1032 Unspecified acute conjunctivitis, left eye: Secondary | ICD-10-CM | POA: Diagnosis not present

## 2016-09-03 DIAGNOSIS — Z720 Tobacco use: Secondary | ICD-10-CM

## 2016-09-03 DIAGNOSIS — E66811 Obesity, class 1: Secondary | ICD-10-CM

## 2016-09-03 LAB — CUP PACEART INCLINIC DEVICE CHECK
Battery Impedance: 543 Ohm
Battery Remaining Longevity: 90 mo
Brady Statistic AP VP Percent: 1 %
Brady Statistic AP VS Percent: 0 %
Brady Statistic AS VS Percent: 0 %
Date Time Interrogation Session: 20180228142146
Implantable Lead Implant Date: 20100924
Implantable Lead Location: 753860
Implantable Lead Model: 5076
Implantable Pulse Generator Implant Date: 20100924
Lead Channel Pacing Threshold Amplitude: 0.375 V
Lead Channel Pacing Threshold Amplitude: 0.5 V
Lead Channel Pacing Threshold Amplitude: 0.75 V
Lead Channel Pacing Threshold Pulse Width: 0.4 ms
Lead Channel Pacing Threshold Pulse Width: 0.4 ms
Lead Channel Setting Pacing Amplitude: 1.5 V
Lead Channel Setting Pacing Pulse Width: 0.4 ms
MDC IDC LEAD IMPLANT DT: 20100924
MDC IDC LEAD LOCATION: 753859
MDC IDC MSMT BATTERY VOLTAGE: 2.79 V
MDC IDC MSMT LEADCHNL RA IMPEDANCE VALUE: 480 Ohm
MDC IDC MSMT LEADCHNL RA PACING THRESHOLD PULSEWIDTH: 0.4 ms
MDC IDC MSMT LEADCHNL RA SENSING INTR AMPL: 4 mV
MDC IDC MSMT LEADCHNL RV IMPEDANCE VALUE: 613 Ohm
MDC IDC MSMT LEADCHNL RV PACING THRESHOLD AMPLITUDE: 0.625 V
MDC IDC MSMT LEADCHNL RV PACING THRESHOLD PULSEWIDTH: 0.4 ms
MDC IDC SET LEADCHNL RV PACING AMPLITUDE: 2 V
MDC IDC SET LEADCHNL RV SENSING SENSITIVITY: 4 mV
MDC IDC STAT BRADY AS VP PERCENT: 99 %

## 2016-09-03 NOTE — Patient Instructions (Signed)
Dr Croitoru recommends that you continue on your current medications as directed. Please refer to the Current Medication list given to you today.  Remote monitoring is used to monitor your Pacemaker of ICD from home. This monitoring reduces the number of office visits required to check your device to one time per year. It allows us to keep an eye on the functioning of your device to ensure it is working properly. You are scheduled for a device check from home on Wednesday, May 30th, 2018. You may send your transmission at any time that day. If you have a wireless device, the transmission will be sent automatically. After your physician reviews your transmission, you will receive a postcard with your next transmission date.  Dr Croitoru recommends that you schedule a follow-up appointment in 12 months with a pacemaker check. You will receive a reminder letter in the mail two months in advance. If you don't receive a letter, please call our office to schedule the follow-up appointment.  If you need a refill on your cardiac medications before your next appointment, please call your pharmacy. 

## 2016-09-03 NOTE — Progress Notes (Signed)
Patient ID: Mandy Sellers, female   DOB: Sep 14, 1960, 56 y.o.   MRN: [id number]    Cardiology Office Note    Date:  09/04/2016   ID:  Mandy Sellers, DOB 1961/01/06, MRN [id number]  PCP:  Irven Shelling, MD  Cardiologist:   Sanda Klein, MD   Chief Complaint  Patient presents with  . Follow-up    History of Present Illness:  Mandy Sellers is a 56 y.o. female with complete heart block here for a pacemaker check (she does have a very slow ventricular escape rhythm at about 35 bpm, but feels poorly when she has ventricular pacing threshold checks). No new health problems, no known structural heart disease. Unfortunately still smokes. No CV complaints.  Mandy Sellers's husband passed away a few weeks ago. Her daughter and grandson have moved in with her. 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  Pacemaker interrogation shows normal device function with an estimated generator longevity of greater than 8 years, 100% atrial sensed ventricular paced rhythm with normal heart rate histograms and no recorded episodes of meaningful arrhythmia. Lead parameters are excellent and unchanged from historical values. .  Past Medical History:  Diagnosis Date  . CHB (complete heart block) ([place]) 08/09/2013  . Depression   . HTN (hypertension) 08/09/2013  . Hypertension   . Obesity (BMI 30.0-34.9) 08/09/2013  . Pacemaker    dual-chmber for CHB in 2010  . Tobacco abuse     Past Surgical History:  Procedure Laterality Date  . DILATATION & CURRETTAGE/HYSTEROSCOPY WITH RESECTOCOPE N/A 11/10/2013   Procedure: DILATATION & CURETTAGE/HYSTEROSCOPY WITH MYOSURE;  Surgeon: Darlyn Chamber, MD;  Location: [facility];  Service: Gynecology;  Laterality: N/A;  . INCONTINENCE SURGERY  03/2000  . INSERT / REPLACE / REMOVE PACEMAKER    . NM MYOCAR PERF WALL MOTION  05/2009   bruce myoview; normal pattern of perfusion in all regions, low risk  scan  . PACEMAKER INSERTION  03/30/2009   for CHB; Medtronic Adapta ADDRL1 - Dr. Norlene Duel  . TRANSTHORACIC ECHOCARDIOGRAM  09/2010   EF=>55%; mild MR; trace pulm valve regurg   . TUBAL LIGATION  03/2000    Outpatient Medications Prior to Visit  Medication Sig Dispense Refill  . aspirin EC 81 MG tablet Take 81 mg by mouth at bedtime.    . B Complex Vitamins (VITAMIN B COMPLEX) TABS Take 1 tablet by mouth daily.    Marland Kitchen BYSTOLIC 10 MG tablet Take 1 tablet by mouth  daily 90 tablet 2  . Cholecalciferol (VITAMIN D-3) 1000 UNITS CAPS Take 1,000 Units by mouth daily.    . hydrochlorothiazide (MICROZIDE) 12.5 MG capsule Take 1 capsule by mouth  daily 90 capsule 3  . Multiple Vitamin (MULTIVITAMIN WITH MINERALS) TABS tablet Take 1 tablet by mouth daily.    . Multiple Vitamins-Minerals (AIRBORNE) CHEW Chew 1 tablet by mouth daily.    . Omega-3 Fatty Acids (FISH OIL) 1000 MG CAPS Take 2,000 mg by mouth daily.    Marland Kitchen venlafaxine XR (EFFEXOR-XR) 75 MG 24 hr capsule Take 75 mg by mouth every evening.    Marland Kitchen amoxicillin-clavulanate (AUGMENTIN) 875-125 MG tablet Take 1 tablet by mouth every 12 (twelve) hours. (Patient not taking: Reported on 09/03/2016) 14 tablet 0  . fluticasone (FLONASE) 50 MCG/ACT nasal spray Place 2 sprays into both nostrils daily. (Patient not taking: Reported on 09/03/2016) 16 g 0  . HYDROcodone-acetaminophen (NORCO/VICODIN)  5-325 MG tablet Take 1-2 tablets by mouth every 4 (four) hours as needed for moderate pain or severe pain. (Patient not taking: Reported on 09/03/2016) 10 tablet 0  . ibuprofen (ADVIL,MOTRIN) 800 MG tablet Take 1 tablet (800 mg total) by mouth every 8 (eight) hours as needed for mild pain or moderate pain. (Patient not taking: Reported on 09/03/2016) 15 tablet 0  . pseudoephedrine (SUDAFED) 60 MG tablet Take 1 tablet (60 mg total) by mouth every 6 (six) hours as needed for congestion. (Patient not taking: Reported on 09/03/2016) 30 tablet 0   No facility-administered  medications prior to visit.      Allergies:   Varenicline   Social History   Social History  . Marital status: Married    Spouse name: N/A  . Number of children: 1  . Years of education: master's   Occupational History  .  [place] History Main Topics  . Smoking status: Heavy Tobacco Smoker    Packs/day: 1.00    Years: 30.00    Types: Cigarettes  . Smokeless tobacco: Never Used  . Alcohol use Yes     Comment: social  . Drug use: No  . Sexual activity: Yes   Other Topics Concern  . Not on file   Social History Narrative  . No narrative on file     Family History:  The patient's family history includes Alcohol abuse in her father; Deep vein thrombosis in her brother; Diabetes in her father and mother; Hypertension in her father; Stroke in her father.   ROS:   Please see the history of present illness.    ROS All other systems reviewed and are negative.   PHYSICAL EXAM:   VS:  BP 137/84   Pulse 82   Ht 5\' 6"  (1.676 m)   Wt 96.6 kg (213 lb)   LMP 06/15/2013   BMI 34.38 kg/m    GEN: Well nourished, well developed, in no acute distress HEENT: normal Neck: no JVD, carotid bruits, or masses Cardiac: RRR, paradoxically split; no murmurs, rubs, or gallops,no edema ; healthy left subclavian PPM site Respiratory:  clear to auscultation bilaterally, normal work of breathing GI: soft, nontender, nondistended, + BS MS: no deformity or atrophy Skin: warm and dry, no rash Neuro:  Alert and Oriented x 3, Strength and sensation are intact Psych: euthymic mood, full affect  Wt Readings from Last 3 Encounters:  09/03/16 96.6 kg (213 lb)  06/15/16 94.8 kg (209 lb)  08/28/15 96.2 kg (212 lb 2 oz)      Studies/Labs Reviewed:   EKG:  EKG is ordered today.  The ekg ordered today demonstrates A paced V sensed rhythm, +ve R wave in V1-V2  Recent Labs: No results found for requested labs within last 8760 hours.   Lipid Panel    Component Value Date/Time    CHOL  03/29/2009 0730    144        ATP III CLASSIFICATION:  <200     mg/dL   Desirable  200-239  mg/dL   Borderline High  >=240    mg/dL   High          TRIG 123 03/29/2009 0730   HDL 50 03/29/2009 0730   CHOLHDL 2.9 03/29/2009 0730   VLDL 25 03/29/2009 0730   LDLCALC  03/29/2009 0730    69        Total Cholesterol/HDL:CHD Risk Coronary Heart Disease Risk Table  Men   Women  1/2 Average Risk   3.4   3.3  Average Risk       5.0   4.4  2 X Average Risk   9.6   7.1  3 X Average Risk  23.4   11.0        Use the calculated Patient Ratio above and the CHD Risk Table to determine the patient's CHD Risk.        ATP III CLASSIFICATION (LDL):  <100     mg/dL   Optimal  100-129  mg/dL   Near or Above                    Optimal  130-159  mg/dL   Borderline  160-189  mg/dL   High  >190     mg/dL   Very High    Additional studies/ records that were reviewed today include:  CT abd and pelvis 02/08/2014   ASSESSMENT:    1. Pacemaker - dual chamber Medtronic 2010   2. CHB (complete heart block) ([facility])   3. Essential hypertension   4. Obesity (BMI 30.0-34.9)   5. Tobacco abuse      PLAN:  In order of problems listed above:  1. CHB: Should be considered pacemaker dependent 2. PPM: All lead parameters are excellent, but review of the imaging studies (not so clear on CXR, but clear on CT abdomen) shows that the RV lead was inserted into the middle cardiac vein, rather than the RV apex. This should not be an issue unless she will require CRT in the future. Carelink Q3 mos, office visit yearly. 3. HTN: Her blood pressure is not quite at target, but she is a little emotional today. With focus on weight loss and exercise rather than more medications. 4. Obesity: Weight loss encouraged 5. Smoking cessation recommended, but with her recent emotional upheaval, now is not the best time to try to quit.    Medication Adjustments/Labs and Tests Ordered: Current  medicines are reviewed at length with the patient today.  Concerns regarding medicines are outlined above.  Medication changes, Labs and Tests ordered today are listed in the Patient Instructions below. Patient Instructions  Dr Sallyanne Kuster recommends that you continue on your current medications as directed. Please refer to the Current Medication list given to you today.  Remote monitoring is used to monitor your Pacemaker of ICD from home. This monitoring reduces the number of office visits required to check your device to one time per year. It allows [place] to keep an eye on the functioning of your device to ensure it is working properly. You are scheduled for a device check from home on Wednesday, May 30th, 2018. You may send your transmission at any time that day. If you have a wireless device, the transmission will be sent automatically. After your physician reviews your transmission, you will receive a postcard with your next transmission date.  Dr Sallyanne Kuster recommends that you schedule a follow-up appointment in 12 months with a pacemaker check. You will receive a reminder letter in the mail two months in advance. If you don't receive a letter, please call our office to schedule the follow-up appointment.  If you need a refill on your cardiac medications before your next appointment, please call your pharmacy.      Signed, Sanda Klein, MD  09/04/2016 2:22 PM    [facility] [address], [place], [place]  [address] Phone: [phone]; Fax: [phone]    

## 2016-09-04 ENCOUNTER — Encounter: Payer: Self-pay | Admitting: Cardiovascular Disease

## 2016-09-18 DIAGNOSIS — H524 Presbyopia: Secondary | ICD-10-CM | POA: Diagnosis not present

## 2016-10-03 ENCOUNTER — Other Ambulatory Visit: Payer: Self-pay | Admitting: Cardiovascular Disease

## 2016-10-03 NOTE — Telephone Encounter (Signed)
Rx request sent to pharmacy.  

## 2016-10-27 DIAGNOSIS — H1032 Unspecified acute conjunctivitis, left eye: Secondary | ICD-10-CM | POA: Diagnosis not present

## 2016-10-27 DIAGNOSIS — J209 Acute bronchitis, unspecified: Secondary | ICD-10-CM | POA: Diagnosis not present

## 2016-11-21 DIAGNOSIS — H0014 Chalazion left upper eyelid: Secondary | ICD-10-CM | POA: Diagnosis not present

## 2016-12-03 ENCOUNTER — Encounter: Payer: 59 | Admitting: *Deleted

## 2016-12-05 ENCOUNTER — Encounter: Payer: Self-pay | Admitting: Cardiology

## 2016-12-08 ENCOUNTER — Ambulatory Visit (INDEPENDENT_AMBULATORY_CARE_PROVIDER_SITE_OTHER): Payer: 59 | Admitting: *Deleted

## 2016-12-08 DIAGNOSIS — I442 Atrioventricular block, complete: Secondary | ICD-10-CM | POA: Diagnosis not present

## 2016-12-08 NOTE — Progress Notes (Signed)
Remote pacemaker transmission.   

## 2016-12-09 LAB — CUP PACEART REMOTE DEVICE CHECK
Battery Impedance: 618 Ohm
Battery Remaining Longevity: 84 mo
Battery Voltage: 2.79 V
Brady Statistic AP VP Percent: 1 %
Brady Statistic AP VS Percent: 0 %
Brady Statistic AS VP Percent: 99 %
Date Time Interrogation Session: 20180603172050
Implantable Lead Implant Date: 20100924
Implantable Lead Location: 753860
Implantable Lead Model: 5076
Implantable Lead Model: 5076
Implantable Pulse Generator Implant Date: 20100924
Lead Channel Impedance Value: 480 Ohm
Lead Channel Pacing Threshold Amplitude: 0.375 V
Lead Channel Pacing Threshold Amplitude: 0.625 V
Lead Channel Pacing Threshold Pulse Width: 0.4 ms
Lead Channel Sensing Intrinsic Amplitude: 2.8 mV
Lead Channel Setting Pacing Pulse Width: 0.4 ms
MDC IDC LEAD IMPLANT DT: 20100924
MDC IDC LEAD LOCATION: 753859
MDC IDC MSMT LEADCHNL RV IMPEDANCE VALUE: 580 Ohm
MDC IDC MSMT LEADCHNL RV PACING THRESHOLD PULSEWIDTH: 0.4 ms
MDC IDC SET LEADCHNL RA PACING AMPLITUDE: 1.5 V
MDC IDC SET LEADCHNL RV PACING AMPLITUDE: 2 V
MDC IDC SET LEADCHNL RV SENSING SENSITIVITY: 4 mV
MDC IDC STAT BRADY AS VS PERCENT: 0 %

## 2016-12-16 ENCOUNTER — Encounter: Payer: Self-pay | Admitting: Cardiology

## 2017-01-01 ENCOUNTER — Encounter: Payer: Self-pay | Admitting: Cardiology

## 2017-01-08 ENCOUNTER — Other Ambulatory Visit: Payer: Self-pay | Admitting: Cardiovascular Disease

## 2017-03-10 DIAGNOSIS — M5136 Other intervertebral disc degeneration, lumbar region: Secondary | ICD-10-CM | POA: Diagnosis not present

## 2017-03-10 DIAGNOSIS — M5416 Radiculopathy, lumbar region: Secondary | ICD-10-CM | POA: Diagnosis not present

## 2017-03-10 DIAGNOSIS — M5137 Other intervertebral disc degeneration, lumbosacral region: Secondary | ICD-10-CM | POA: Diagnosis not present

## 2017-03-11 ENCOUNTER — Encounter: Payer: Self-pay | Admitting: *Deleted

## 2017-03-11 ENCOUNTER — Telehealth: Payer: Self-pay | Admitting: Cardiology

## 2017-03-11 NOTE — Telephone Encounter (Signed)
Spoke with pt and reminded pt of remote transmission that is due today. Pt verbalized understanding.   

## 2017-03-12 DIAGNOSIS — M5137 Other intervertebral disc degeneration, lumbosacral region: Secondary | ICD-10-CM | POA: Diagnosis not present

## 2017-03-12 DIAGNOSIS — M5136 Other intervertebral disc degeneration, lumbar region: Secondary | ICD-10-CM | POA: Diagnosis not present

## 2017-03-12 DIAGNOSIS — M5416 Radiculopathy, lumbar region: Secondary | ICD-10-CM | POA: Diagnosis not present

## 2017-03-13 ENCOUNTER — Ambulatory Visit (INDEPENDENT_AMBULATORY_CARE_PROVIDER_SITE_OTHER): Payer: 59 | Admitting: *Deleted

## 2017-03-13 DIAGNOSIS — I442 Atrioventricular block, complete: Secondary | ICD-10-CM | POA: Diagnosis not present

## 2017-03-16 NOTE — Progress Notes (Signed)
Remote pacemaker transmission.   

## 2017-03-17 ENCOUNTER — Encounter: Payer: Self-pay | Admitting: Cardiology

## 2017-03-17 DIAGNOSIS — M5137 Other intervertebral disc degeneration, lumbosacral region: Secondary | ICD-10-CM | POA: Diagnosis not present

## 2017-03-17 DIAGNOSIS — M5136 Other intervertebral disc degeneration, lumbar region: Secondary | ICD-10-CM | POA: Diagnosis not present

## 2017-03-17 DIAGNOSIS — M5416 Radiculopathy, lumbar region: Secondary | ICD-10-CM | POA: Diagnosis not present

## 2017-03-18 DIAGNOSIS — M5136 Other intervertebral disc degeneration, lumbar region: Secondary | ICD-10-CM | POA: Diagnosis not present

## 2017-03-18 DIAGNOSIS — M5416 Radiculopathy, lumbar region: Secondary | ICD-10-CM | POA: Diagnosis not present

## 2017-03-18 DIAGNOSIS — M5137 Other intervertebral disc degeneration, lumbosacral region: Secondary | ICD-10-CM | POA: Diagnosis not present

## 2017-03-19 DIAGNOSIS — M5136 Other intervertebral disc degeneration, lumbar region: Secondary | ICD-10-CM | POA: Diagnosis not present

## 2017-03-19 DIAGNOSIS — M5416 Radiculopathy, lumbar region: Secondary | ICD-10-CM | POA: Diagnosis not present

## 2017-03-19 DIAGNOSIS — M5137 Other intervertebral disc degeneration, lumbosacral region: Secondary | ICD-10-CM | POA: Diagnosis not present

## 2017-03-24 DIAGNOSIS — M5136 Other intervertebral disc degeneration, lumbar region: Secondary | ICD-10-CM | POA: Diagnosis not present

## 2017-03-24 DIAGNOSIS — M5416 Radiculopathy, lumbar region: Secondary | ICD-10-CM | POA: Diagnosis not present

## 2017-03-24 DIAGNOSIS — M5137 Other intervertebral disc degeneration, lumbosacral region: Secondary | ICD-10-CM | POA: Diagnosis not present

## 2017-03-25 DIAGNOSIS — M5137 Other intervertebral disc degeneration, lumbosacral region: Secondary | ICD-10-CM | POA: Diagnosis not present

## 2017-03-25 DIAGNOSIS — M5136 Other intervertebral disc degeneration, lumbar region: Secondary | ICD-10-CM | POA: Diagnosis not present

## 2017-03-25 DIAGNOSIS — M5416 Radiculopathy, lumbar region: Secondary | ICD-10-CM | POA: Diagnosis not present

## 2017-03-26 DIAGNOSIS — M5416 Radiculopathy, lumbar region: Secondary | ICD-10-CM | POA: Diagnosis not present

## 2017-03-26 DIAGNOSIS — M5137 Other intervertebral disc degeneration, lumbosacral region: Secondary | ICD-10-CM | POA: Diagnosis not present

## 2017-03-26 DIAGNOSIS — M5136 Other intervertebral disc degeneration, lumbar region: Secondary | ICD-10-CM | POA: Diagnosis not present

## 2017-04-01 DIAGNOSIS — M5136 Other intervertebral disc degeneration, lumbar region: Secondary | ICD-10-CM | POA: Diagnosis not present

## 2017-04-01 DIAGNOSIS — M5137 Other intervertebral disc degeneration, lumbosacral region: Secondary | ICD-10-CM | POA: Diagnosis not present

## 2017-04-01 DIAGNOSIS — M5416 Radiculopathy, lumbar region: Secondary | ICD-10-CM | POA: Diagnosis not present

## 2017-04-02 DIAGNOSIS — M5137 Other intervertebral disc degeneration, lumbosacral region: Secondary | ICD-10-CM | POA: Diagnosis not present

## 2017-04-02 DIAGNOSIS — M5416 Radiculopathy, lumbar region: Secondary | ICD-10-CM | POA: Diagnosis not present

## 2017-04-02 DIAGNOSIS — M5136 Other intervertebral disc degeneration, lumbar region: Secondary | ICD-10-CM | POA: Diagnosis not present

## 2017-04-03 LAB — CUP PACEART REMOTE DEVICE CHECK
Battery Remaining Longevity: 75 mo
Battery Voltage: 2.78 V
Brady Statistic AP VP Percent: 1 %
Brady Statistic AP VS Percent: 0 %
Brady Statistic AS VS Percent: 0 %
Date Time Interrogation Session: 20180907190008
Implantable Lead Implant Date: 20100924
Implantable Lead Implant Date: 20100924
Implantable Lead Location: 753860
Implantable Lead Model: 5076
Implantable Pulse Generator Implant Date: 20100924
Lead Channel Impedance Value: 618 Ohm
Lead Channel Pacing Threshold Amplitude: 0.625 V
Lead Channel Pacing Threshold Pulse Width: 0.4 ms
Lead Channel Pacing Threshold Pulse Width: 0.4 ms
Lead Channel Setting Pacing Amplitude: 1.5 V
Lead Channel Setting Sensing Sensitivity: 4 mV
MDC IDC LEAD LOCATION: 753859
MDC IDC MSMT BATTERY IMPEDANCE: 795 Ohm
MDC IDC MSMT LEADCHNL RA IMPEDANCE VALUE: 501 Ohm
MDC IDC MSMT LEADCHNL RA PACING THRESHOLD AMPLITUDE: 0.375 V
MDC IDC MSMT LEADCHNL RA SENSING INTR AMPL: 2.8 mV
MDC IDC SET LEADCHNL RV PACING AMPLITUDE: 2 V
MDC IDC SET LEADCHNL RV PACING PULSEWIDTH: 0.4 ms
MDC IDC STAT BRADY AS VP PERCENT: 99 %

## 2017-04-06 DIAGNOSIS — M5137 Other intervertebral disc degeneration, lumbosacral region: Secondary | ICD-10-CM | POA: Diagnosis not present

## 2017-04-06 DIAGNOSIS — M5136 Other intervertebral disc degeneration, lumbar region: Secondary | ICD-10-CM | POA: Diagnosis not present

## 2017-04-06 DIAGNOSIS — M5416 Radiculopathy, lumbar region: Secondary | ICD-10-CM | POA: Diagnosis not present

## 2017-04-09 DIAGNOSIS — M5416 Radiculopathy, lumbar region: Secondary | ICD-10-CM | POA: Diagnosis not present

## 2017-04-09 DIAGNOSIS — M5137 Other intervertebral disc degeneration, lumbosacral region: Secondary | ICD-10-CM | POA: Diagnosis not present

## 2017-04-09 DIAGNOSIS — M5136 Other intervertebral disc degeneration, lumbar region: Secondary | ICD-10-CM | POA: Diagnosis not present

## 2017-04-13 DIAGNOSIS — M5137 Other intervertebral disc degeneration, lumbosacral region: Secondary | ICD-10-CM | POA: Diagnosis not present

## 2017-04-13 DIAGNOSIS — M5416 Radiculopathy, lumbar region: Secondary | ICD-10-CM | POA: Diagnosis not present

## 2017-04-13 DIAGNOSIS — M5136 Other intervertebral disc degeneration, lumbar region: Secondary | ICD-10-CM | POA: Diagnosis not present

## 2017-04-20 DIAGNOSIS — M5416 Radiculopathy, lumbar region: Secondary | ICD-10-CM | POA: Diagnosis not present

## 2017-04-20 DIAGNOSIS — M5136 Other intervertebral disc degeneration, lumbar region: Secondary | ICD-10-CM | POA: Diagnosis not present

## 2017-04-20 DIAGNOSIS — Z23 Encounter for immunization: Secondary | ICD-10-CM | POA: Diagnosis not present

## 2017-04-20 DIAGNOSIS — M5137 Other intervertebral disc degeneration, lumbosacral region: Secondary | ICD-10-CM | POA: Diagnosis not present

## 2017-04-21 DIAGNOSIS — M5137 Other intervertebral disc degeneration, lumbosacral region: Secondary | ICD-10-CM | POA: Diagnosis not present

## 2017-04-21 DIAGNOSIS — M5136 Other intervertebral disc degeneration, lumbar region: Secondary | ICD-10-CM | POA: Diagnosis not present

## 2017-04-21 DIAGNOSIS — M5416 Radiculopathy, lumbar region: Secondary | ICD-10-CM | POA: Diagnosis not present

## 2017-04-23 DIAGNOSIS — M5137 Other intervertebral disc degeneration, lumbosacral region: Secondary | ICD-10-CM | POA: Diagnosis not present

## 2017-04-23 DIAGNOSIS — M5416 Radiculopathy, lumbar region: Secondary | ICD-10-CM | POA: Diagnosis not present

## 2017-04-23 DIAGNOSIS — M5136 Other intervertebral disc degeneration, lumbar region: Secondary | ICD-10-CM | POA: Diagnosis not present

## 2017-04-27 DIAGNOSIS — M5416 Radiculopathy, lumbar region: Secondary | ICD-10-CM | POA: Diagnosis not present

## 2017-04-27 DIAGNOSIS — M5136 Other intervertebral disc degeneration, lumbar region: Secondary | ICD-10-CM | POA: Diagnosis not present

## 2017-04-27 DIAGNOSIS — M5137 Other intervertebral disc degeneration, lumbosacral region: Secondary | ICD-10-CM | POA: Diagnosis not present

## 2017-05-04 DIAGNOSIS — M5416 Radiculopathy, lumbar region: Secondary | ICD-10-CM | POA: Diagnosis not present

## 2017-05-04 DIAGNOSIS — M5137 Other intervertebral disc degeneration, lumbosacral region: Secondary | ICD-10-CM | POA: Diagnosis not present

## 2017-05-04 DIAGNOSIS — M5136 Other intervertebral disc degeneration, lumbar region: Secondary | ICD-10-CM | POA: Diagnosis not present

## 2017-05-06 DIAGNOSIS — M5416 Radiculopathy, lumbar region: Secondary | ICD-10-CM | POA: Diagnosis not present

## 2017-05-06 DIAGNOSIS — M5136 Other intervertebral disc degeneration, lumbar region: Secondary | ICD-10-CM | POA: Diagnosis not present

## 2017-05-06 DIAGNOSIS — M5137 Other intervertebral disc degeneration, lumbosacral region: Secondary | ICD-10-CM | POA: Diagnosis not present

## 2017-05-07 DIAGNOSIS — M5416 Radiculopathy, lumbar region: Secondary | ICD-10-CM | POA: Diagnosis not present

## 2017-05-07 DIAGNOSIS — M5136 Other intervertebral disc degeneration, lumbar region: Secondary | ICD-10-CM | POA: Diagnosis not present

## 2017-05-07 DIAGNOSIS — M5137 Other intervertebral disc degeneration, lumbosacral region: Secondary | ICD-10-CM | POA: Diagnosis not present

## 2017-05-12 DIAGNOSIS — M5137 Other intervertebral disc degeneration, lumbosacral region: Secondary | ICD-10-CM | POA: Diagnosis not present

## 2017-05-12 DIAGNOSIS — M5136 Other intervertebral disc degeneration, lumbar region: Secondary | ICD-10-CM | POA: Diagnosis not present

## 2017-05-12 DIAGNOSIS — M5416 Radiculopathy, lumbar region: Secondary | ICD-10-CM | POA: Diagnosis not present

## 2017-05-14 DIAGNOSIS — I10 Essential (primary) hypertension: Secondary | ICD-10-CM | POA: Diagnosis not present

## 2017-05-14 DIAGNOSIS — Z0000 Encounter for general adult medical examination without abnormal findings: Secondary | ICD-10-CM | POA: Diagnosis not present

## 2017-05-14 DIAGNOSIS — J449 Chronic obstructive pulmonary disease, unspecified: Secondary | ICD-10-CM | POA: Diagnosis not present

## 2017-05-14 DIAGNOSIS — R7301 Impaired fasting glucose: Secondary | ICD-10-CM | POA: Diagnosis not present

## 2017-06-15 ENCOUNTER — Ambulatory Visit (INDEPENDENT_AMBULATORY_CARE_PROVIDER_SITE_OTHER): Payer: 59 | Admitting: *Deleted

## 2017-06-15 DIAGNOSIS — I442 Atrioventricular block, complete: Secondary | ICD-10-CM | POA: Diagnosis not present

## 2017-06-15 NOTE — Progress Notes (Signed)
Remote pacemaker transmission.   

## 2017-06-17 LAB — CUP PACEART REMOTE DEVICE CHECK
Battery Remaining Longevity: 71 mo
Battery Voltage: 2.79 V
Brady Statistic AP VP Percent: 1 %
Brady Statistic AP VS Percent: 0 %
Date Time Interrogation Session: 20181210115748
Implantable Lead Implant Date: 20100924
Implantable Lead Location: 753859
Implantable Lead Model: 5076
Lead Channel Impedance Value: 488 Ohm
Lead Channel Impedance Value: 533 Ohm
Lead Channel Pacing Threshold Amplitude: 0.375 V
Lead Channel Pacing Threshold Pulse Width: 0.4 ms
Lead Channel Setting Pacing Amplitude: 2 V
Lead Channel Setting Sensing Sensitivity: 4 mV
MDC IDC LEAD IMPLANT DT: 20100924
MDC IDC LEAD LOCATION: 753860
MDC IDC MSMT BATTERY IMPEDANCE: 845 Ohm
MDC IDC MSMT LEADCHNL RA SENSING INTR AMPL: 2.8 mV
MDC IDC MSMT LEADCHNL RV PACING THRESHOLD AMPLITUDE: 0.625 V
MDC IDC MSMT LEADCHNL RV PACING THRESHOLD PULSEWIDTH: 0.4 ms
MDC IDC PG IMPLANT DT: 20100924
MDC IDC SET LEADCHNL RA PACING AMPLITUDE: 1.5 V
MDC IDC SET LEADCHNL RV PACING PULSEWIDTH: 0.4 ms
MDC IDC STAT BRADY AS VP PERCENT: 99 %
MDC IDC STAT BRADY AS VS PERCENT: 0 %

## 2017-06-19 ENCOUNTER — Encounter: Payer: Self-pay | Admitting: Cardiology

## 2017-06-19 DIAGNOSIS — N179 Acute kidney failure, unspecified: Secondary | ICD-10-CM | POA: Diagnosis not present

## 2017-06-19 DIAGNOSIS — N183 Chronic kidney disease, stage 3 (moderate): Secondary | ICD-10-CM | POA: Diagnosis not present

## 2017-06-22 DIAGNOSIS — Z01419 Encounter for gynecological examination (general) (routine) without abnormal findings: Secondary | ICD-10-CM | POA: Diagnosis not present

## 2017-06-26 ENCOUNTER — Other Ambulatory Visit: Payer: Self-pay | Admitting: Obstetrics and Gynecology

## 2017-06-26 DIAGNOSIS — R928 Other abnormal and inconclusive findings on diagnostic imaging of breast: Secondary | ICD-10-CM

## 2017-07-02 ENCOUNTER — Ambulatory Visit
Admission: RE | Admit: 2017-07-02 | Discharge: 2017-07-02 | Disposition: A | Discharge status: Home / Self Care | Payer: 59 | Source: Ambulatory Visit | Attending: Obstetrics and Gynecology | Admitting: Obstetrics and Gynecology

## 2017-07-02 ENCOUNTER — Other Ambulatory Visit: Payer: Self-pay | Admitting: Obstetrics and Gynecology

## 2017-07-02 ENCOUNTER — Other Ambulatory Visit: Payer: Self-pay | Admitting: Cardiovascular Disease

## 2017-07-02 DIAGNOSIS — R928 Other abnormal and inconclusive findings on diagnostic imaging of breast: Secondary | ICD-10-CM

## 2017-07-02 DIAGNOSIS — N6314 Unspecified lump in the right breast, lower inner quadrant: Secondary | ICD-10-CM | POA: Diagnosis not present

## 2017-07-03 ENCOUNTER — Ambulatory Visit
Admission: RE | Admit: 2017-07-03 | Discharge: 2017-07-03 | Disposition: A | Discharge status: Home / Self Care | Payer: 59 | Source: Ambulatory Visit | Attending: Obstetrics and Gynecology | Admitting: Obstetrics and Gynecology

## 2017-07-03 ENCOUNTER — Other Ambulatory Visit: Payer: Self-pay | Admitting: Obstetrics and Gynecology

## 2017-07-03 DIAGNOSIS — R928 Other abnormal and inconclusive findings on diagnostic imaging of breast: Secondary | ICD-10-CM

## 2017-07-03 DIAGNOSIS — N6312 Unspecified lump in the right breast, upper inner quadrant: Secondary | ICD-10-CM | POA: Diagnosis not present

## 2017-07-03 DIAGNOSIS — D241 Benign neoplasm of right breast: Secondary | ICD-10-CM | POA: Diagnosis not present

## 2017-09-09 ENCOUNTER — Encounter: Payer: Self-pay | Admitting: Cardiovascular Disease

## 2017-09-09 ENCOUNTER — Ambulatory Visit: Payer: 59 | Admitting: Cardiovascular Disease

## 2017-09-09 VITALS — BP 120/80 | HR 76 | Ht 66.0 in | Wt 209.6 lb

## 2017-09-09 DIAGNOSIS — Z950 Presence of cardiac pacemaker: Secondary | ICD-10-CM | POA: Diagnosis not present

## 2017-09-09 DIAGNOSIS — I10 Essential (primary) hypertension: Secondary | ICD-10-CM | POA: Diagnosis not present

## 2017-09-09 DIAGNOSIS — I442 Atrioventricular block, complete: Secondary | ICD-10-CM | POA: Diagnosis not present

## 2017-09-09 DIAGNOSIS — E669 Obesity, unspecified: Secondary | ICD-10-CM

## 2017-09-09 DIAGNOSIS — F17200 Nicotine dependence, unspecified, uncomplicated: Secondary | ICD-10-CM | POA: Diagnosis not present

## 2017-09-09 NOTE — Progress Notes (Signed)
Patient ID: Mandy Sellers, female   DOB: [birth year], 57 y.o.   MRN: [id number]    Cardiology Office Note    Date:  09/09/2017   ID:  RUTHANNA MACCHIA, DOB [birth year], MRN [id number]  PCP:  Lavone Orn, MD  Cardiologist:   Sanda Klein, MD   Chief Complaint  Patient presents with  . Follow-up    History of Present Illness:  Mandy Sellers is a 57 y.o. female with complete heart block here for a pacemaker check (she does have a very slow ventricular escape rhythm at about 35 bpm, but feels poorly when she has ventricular pacing threshold checks).  She complains of some lack of energy, but denies dyspnea, angina, edema, palpitations, syncope, focal neurological complaints, leg edema or bleeding problems.  Masako's husband passed away a little more than a year ago.  She still "talks to him every day", but seems to be emerging from her period of morning.  Unfortunately she is still smoking.  Recent labs show hemoglobin A1c of 6.5%, frankly in the mild diabetes range.  She is not on any medications for this.  Triglyceride levels were high, but the sample was not fasting cholesterol levels are excellent.  Pacemaker interrogation shows normal device function with an estimated generator longevity of 8-12 years, 100% atrial sensed ventricular paced rhythm with normal heart rate histograms and no recorded episodes of meaningful arrhythmia. Lead parameters are excellent and unchanged from historical values. .  Past Medical History:  Diagnosis Date  . CHB (complete heart block) ([facility]) 08/09/2013  . Depression   . HTN (hypertension) 08/09/2013  . Hypertension   . Obesity (BMI 30.0-34.9) 08/09/2013  . Pacemaker    dual-chmber for CHB in 2010  . Tobacco abuse     Past Surgical History:  Procedure Laterality Date  . DILATATION & CURRETTAGE/HYSTEROSCOPY WITH RESECTOCOPE N/A 11/10/2013   Procedure: DILATATION & CURETTAGE/HYSTEROSCOPY WITH MYOSURE;  Surgeon: Darlyn Chamber, MD;  Location: [facility];   Service: Gynecology;  Laterality: N/A;  . INCONTINENCE SURGERY  03/2000  . INSERT / REPLACE / REMOVE PACEMAKER    . NM MYOCAR PERF WALL MOTION  05/2009   bruce myoview; normal pattern of perfusion in all regions, low risk scan  . PACEMAKER INSERTION  03/30/2009   for CHB; Medtronic Adapta ADDRL1 - Dr. Norlene Duel  . TRANSTHORACIC ECHOCARDIOGRAM  09/2010   EF=>55%; mild MR; trace pulm valve regurg   . TUBAL LIGATION  03/2000    Outpatient Medications Prior to Visit  Medication Sig Dispense Refill  . aspirin EC 81 MG tablet Take 81 mg by mouth at bedtime.    . B Complex Vitamins (VITAMIN B COMPLEX) TABS Take 1 tablet by mouth daily.    Marland Kitchen BYSTOLIC 10 MG tablet TAKE 1 TABLET BY MOUTH  DAILY 30 tablet 1  . Cholecalciferol (VITAMIN D-3) 1000 UNITS CAPS Take 1,000 Units by mouth daily.    . hydrochlorothiazide (MICROZIDE) 12.5 MG capsule TAKE 1 CAPSULE BY MOUTH  DAILY 90 capsule 3  . Multiple Vitamin (MULTIVITAMIN WITH MINERALS) TABS tablet Take 1 tablet by mouth daily.    . Multiple Vitamins-Minerals (AIRBORNE) CHEW Chew 1 tablet by mouth daily.    . Omega-3 Fatty Acids (FISH OIL) 1000 MG CAPS Take 2,000 mg by mouth daily.    Marland Kitchen venlafaxine XR (EFFEXOR-XR) 75 MG 24 hr capsule Take 75 mg by mouth every evening.     No facility-administered medications prior to visit.      Allergies:  Varenicline   Social History   Socioeconomic History  . Marital status: Married    Spouse name: None  . Number of children: 1  . Years of education: master's  . Highest education level: None  Social Needs  . Financial resource strain: None  . Food insecurity - worry: None  . Food insecurity - inability: None  . Transportation needs - medical: None  . Transportation needs - non-medical: None  Occupational History    Employer: [employer]  Tobacco Use  . Smoking status: Heavy Tobacco Smoker    Packs/day: 1.00    Years: 30.00    Pack years: 30.00    Types: Cigarettes  . Smokeless tobacco: Never  Used  Substance and Sexual Activity  . Alcohol use: Yes    Comment: social  . Drug use: No  . Sexual activity: Yes  Other Topics Concern  . None  Social History Narrative  . None     Family History:  The patient's family history includes Alcohol abuse in her father; Deep vein thrombosis in her brother; Diabetes in her father and mother; Hypertension in her father; Stroke in her father.   ROS:   Please see the history of present illness.    ROS All other systems reviewed and are negative.   PHYSICAL EXAM:   VS:  BP 120/80   Pulse 76   Ht 5\' 6"  (1.676 m)   Wt 209 lb 9.6 oz (95.1 kg)   LMP 06/15/2013   BMI 33.83 kg/m     General: Alert, oriented x3, no distress, obese, healthy subclavian pacemaker site paradoxically split Head: no evidence of trauma, PERRL, EOMI, no exophtalmos or lid lag, no myxedema, no xanthelasma; normal ears, nose and oropharynx Neck: normal jugular venous pulsations and no hepatojugular reflux; brisk carotid pulses without delay and no carotid bruits Chest: clear to auscultation, no signs of consolidation by percussion or palpation, normal fremitus, symmetrical and full respiratory excursions Cardiovascular: normal position and quality of the apical impulse, regular rhythm, normal first and second heart sounds, no murmurs, rubs or gallops Abdomen: no tenderness or distention, no masses by palpation, no abnormal pulsatility or arterial bruits, normal bowel sounds, no hepatosplenomegaly Extremities: no clubbing, cyanosis or edema; 2+ radial, ulnar and brachial pulses bilaterally; 2+ right femoral, posterior tibial and dorsalis pedis pulses; 2+ left femoral, posterior tibial and dorsalis pedis pulses; no subclavian or femoral bruits Neurological: grossly nonfocal Psych: Normal mood and affect   Wt Readings from Last 3 Encounters:  09/09/17 209 lb 9.6 oz (95.1 kg)  09/03/16 213 lb (96.6 kg)  06/15/16 209 lb (94.8 kg)      Studies/Labs Reviewed:   EKG:   EKG is ordered today.  The ekg ordered today demonstrates atrial paced ventricular sensed with positive R waves in leads V1-V2 Recent Labs: May 14, 2017 total cholesterol 155, HDL 40, LDL 54, triglycerides 306 (nonfasting), hemoglobin A1c 6.5%, hemoglobin 15.2, creatinine 0.74, potassium 4.0  ASSESSMENT:    1. CHB (complete heart block) (H[facility])   2. Pacemaker   3. Essential hypertension   4. Obesity, mild   5. Smoking      PLAN:  In order of problems listed above:  1. CHB: She is pacemaker dependent 2. PPM: Normal device function.  Remote downloads every 3 months and yearly office visits.  Notes that the CT shows that her right ventricular pacemaker lead is actually located in the middle cardiac. 3. HTN: Well-controlled 4. Obesity: Weight loss encouraged 5. Smoking cessation  strongly recommended, especially since she has many other risk factors for coronary/vascular disease including diabetes mellitus and obesity and hypertension   Medication Adjustments/Labs and Tests Ordered: Current medicines are reviewed at length with the patient today.  Concerns regarding medicines are outlined above.  Medication changes, Labs and Tests ordered today are listed in the Patient Instructions below. Patient Instructions  Dr Sallyanne Kuster recommends that you continue on your current medications as directed. Please refer to the Current Medication list given to you today.  Remote monitoring is used to monitor your Pacemaker or ICD from home. This monitoring reduces the number of office visits required to check your device to one time per year. It allows [place] to keep an eye on the functioning of your device to ensure it is working properly. You are scheduled for a device check from home on Monday, March 11th, 2019. You may send your transmission at any time that day. If you have a wireless device, the transmission will be sent automatically. After your physician reviews your transmission, you will receive a  notification with your next transmission date.  Dr Sallyanne Kuster recommends that you schedule a follow-up appointment in 12 months with a pacemaker check. You will receive a reminder letter in the mail two months in advance. If you don't receive a letter, please call our office to schedule the follow-up appointment.  If you need a refill on your cardiac medications before your next appointment, please call your pharmacy.      Signed, Sanda Klein, MD  09/09/2017 9:36 AM    [facility] [address], [place], [place]  [address] Phone: [phone]; Fax: [phone]

## 2017-09-09 NOTE — Patient Instructions (Signed)

## 2017-09-14 ENCOUNTER — Telehealth: Payer: Self-pay | Admitting: Cardiology

## 2017-09-14 ENCOUNTER — Ambulatory Visit (INDEPENDENT_AMBULATORY_CARE_PROVIDER_SITE_OTHER): Payer: 59 | Admitting: *Deleted

## 2017-09-14 DIAGNOSIS — I442 Atrioventricular block, complete: Secondary | ICD-10-CM | POA: Diagnosis not present

## 2017-09-14 NOTE — Telephone Encounter (Signed)
Spoke with pt and reminded pt of remote transmission that is due today. Pt verbalized understanding.   

## 2017-09-15 NOTE — Progress Notes (Signed)
Remote pacemaker transmission.   

## 2017-09-16 ENCOUNTER — Encounter: Payer: Self-pay | Admitting: Cardiology

## 2017-09-16 LAB — CUP PACEART REMOTE DEVICE CHECK
Battery Impedance: 871 Ohm
Battery Voltage: 2.78 V
Brady Statistic AP VS Percent: 0 %
Brady Statistic AS VS Percent: 0 %
Implantable Lead Implant Date: 20100924
Implantable Lead Model: 5076
Implantable Lead Model: 5076
Implantable Pulse Generator Implant Date: 20100924
Lead Channel Impedance Value: 466 Ohm
Lead Channel Impedance Value: 468 Ohm
Lead Channel Pacing Threshold Amplitude: 0.375 V
Lead Channel Pacing Threshold Amplitude: 0.625 V
Lead Channel Setting Pacing Amplitude: 1.5 V
MDC IDC LEAD IMPLANT DT: 20100924
MDC IDC LEAD LOCATION: 753859
MDC IDC LEAD LOCATION: 753860
MDC IDC MSMT BATTERY REMAINING LONGEVITY: 68 mo
MDC IDC MSMT LEADCHNL RA PACING THRESHOLD PULSEWIDTH: 0.4 ms
MDC IDC MSMT LEADCHNL RV PACING THRESHOLD PULSEWIDTH: 0.4 ms
MDC IDC SESS DTM: 20190311204449
MDC IDC SET LEADCHNL RV PACING AMPLITUDE: 2 V
MDC IDC SET LEADCHNL RV PACING PULSEWIDTH: 0.4 ms
MDC IDC SET LEADCHNL RV SENSING SENSITIVITY: 4 mV
MDC IDC STAT BRADY AP VP PERCENT: 0 %
MDC IDC STAT BRADY AS VP PERCENT: 99 %

## 2017-10-06 ENCOUNTER — Other Ambulatory Visit: Payer: Self-pay | Admitting: Cardiovascular Disease

## 2017-10-09 ENCOUNTER — Encounter (HOSPITAL_COMMUNITY): Payer: Self-pay | Admitting: Emergency Medicine

## 2017-10-09 ENCOUNTER — Ambulatory Visit (HOSPITAL_COMMUNITY)
Admission: EM | Admit: 2017-10-09 | Discharge: 2017-10-09 | Disposition: A | Discharge status: Home / Self Care | Payer: 59 | Attending: Internal Medicine | Admitting: Internal Medicine

## 2017-10-09 ENCOUNTER — Other Ambulatory Visit: Payer: Self-pay

## 2017-10-09 DIAGNOSIS — S0990XA Unspecified injury of head, initial encounter: Secondary | ICD-10-CM | POA: Diagnosis not present

## 2017-10-09 DIAGNOSIS — R51 Headache: Secondary | ICD-10-CM | POA: Diagnosis not present

## 2017-10-09 DIAGNOSIS — R42 Dizziness and giddiness: Secondary | ICD-10-CM | POA: Diagnosis not present
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
## 2017-10-09 NOTE — ED Provider Notes (Signed)
[facility]    CSN: [id number] Arrival date & time: 10/09/17  1045     History   Chief Complaint Chief Complaint  Patient presents with  . Head Injury    HPI SHADE RIVENBARK is a 57 y.o. female.   She presents today with dizziness and headache after having a blow to the top of the head 3 or 4 days ago.  She saw stars at the time, really hurt where she was struck on the head.  Has had a little bit of difficulty with feeling like she is not thinking clearly, feeling off balance.  Has had some episodes of intense dizziness, usually 10 or 15 seconds might be several times a day.  Not nauseous or vomiting.  Not falling down.  Was able to drive herself to the urgent care today and walk into the urgent care independently.  The headache is global, the injury was to the left parietal part of her head, the sharp rim of a heavy recycling bin lid came down on her head unexpectedly. No weakness or clumsiness of an arm or leg reported   HPI  Past Medical History:  Diagnosis Date  . CHB (complete heart block) ([facility]) 08/09/2013  . Depression   . HTN (hypertension) 08/09/2013  . Hypertension   . Obesity (BMI 30.0-34.9) 08/09/2013  . Pacemaker    dual-chmber for CHB in 2010  . Tobacco abuse     Patient Active Problem List   Diagnosis Date Noted  . Endometrial thickening on ultra sound 11/10/2013    Class: Present on Admission  . Status post hysteroscopy 11/10/2013    Class: Status post  . CHB (complete heart block) ([facility]) 08/09/2013  . Pacemaker - dual chamber Medtronic 2010 08/09/2013  . Tobacco abuse 08/09/2013  . Obesity (BMI 30.0-34.9) 08/09/2013  . HTN (hypertension) 08/09/2013    Past Surgical History:  Procedure Laterality Date  . DILATATION & CURRETTAGE/HYSTEROSCOPY WITH RESECTOCOPE N/A 11/10/2013   Procedure: DILATATION & CURETTAGE/HYSTEROSCOPY WITH MYOSURE;  Surgeon: Darlyn Chamber, MD;  Location: [facility];  Service: Gynecology;  Laterality: N/A;  . INCONTINENCE SURGERY   03/2000  . INSERT / REPLACE / REMOVE PACEMAKER    . NM MYOCAR PERF WALL MOTION  05/2009   bruce myoview; normal pattern of perfusion in all regions, low risk scan  . PACEMAKER INSERTION  03/30/2009   for CHB; Medtronic Adapta ADDRL1 - Dr. Norlene Duel  . TRANSTHORACIC ECHOCARDIOGRAM  09/2010   EF=>55%; mild MR; trace pulm valve regurg   . TUBAL LIGATION  03/2000    Home Medications    Prior to Admission medications   Medication Sig Start Date End Date Taking? Authorizing Provider  aspirin EC 81 MG tablet Take 81 mg by mouth at bedtime.    [provider]  B Complex Vitamins (VITAMIN B COMPLEX) TABS Take 1 tablet by mouth daily.    [provider]  BYSTOLIC 10 MG tablet TAKE 1 TABLET BY MOUTH  DAILY 07/02/17   Croitoru, Mihai, MD  Cholecalciferol (VITAMIN D-3) 1000 UNITS CAPS Take 1,000 Units by mouth daily.    [provider]  hydrochlorothiazide (MICROZIDE) 12.5 MG capsule TAKE 1 CAPSULE BY MOUTH  DAILY 10/06/17   Croitoru, Mihai, MD  Multiple Vitamin (MULTIVITAMIN WITH MINERALS) TABS tablet Take 1 tablet by mouth daily.    [provider]  Multiple Vitamins-Minerals (AIRBORNE) CHEW Chew 1 tablet by mouth daily.    [provider]  Omega-3 Fatty Acids (FISH OIL)  1000 MG CAPS Take 2,000 mg by mouth daily.    [provider]  venlafaxine XR (EFFEXOR-XR) 75 MG 24 hr capsule Take 75 mg by mouth every evening.    [provider]    Family History Family History  Problem Relation Age of Onset  . Diabetes Mother   . Diabetes Father   . Alcohol abuse Father   . Hypertension Father   . Stroke Father   . Deep vein thrombosis Brother     Social History Social History   Tobacco Use  . Smoking status: Heavy Tobacco Smoker    Packs/day: 1.00    Years: 30.00    Pack years: 30.00    Types: Cigarettes  . Smokeless tobacco: Never Used  Substance Use Topics  . Alcohol use: Yes    Comment: social  . Drug use: No     Allergies     Varenicline   Review of Systems Review of Systems  All other systems reviewed and are negative.    Physical Exam Triage Vital Signs ED Triage Vitals  Enc Vitals Group     BP 10/09/17 1218 128/74     Pulse Rate 10/09/17 1218 67     Resp --      Temp 10/09/17 1218 98.3 F (36.8 C)     Temp Source 10/09/17 1218 Oral     SpO2 10/09/17 1218 97 %     Weight --      Height --      Pain Score 10/09/17 1216 3     Pain Loc --    Updated Vital Signs BP 128/74 (BP Location: Left Arm)   Pulse 67   Temp 98.3 F (36.8 C) (Oral)   LMP 06/15/2013   SpO2 97%  Physical Exam  Constitutional: She is oriented to person, place, and time. No distress.  HENT:  Head: Atraumatic.  Bilateral ears are a little waxy, visualized TM is translucent, no hemotympanum, no retroauricular bruising No nasal septal hematoma No injury to lips or tongue  Eyes: Pupils are equal, round, and reactive to light. EOM are normal.  Conjugate gaze observed, no eye redness/discharge No nystagmus  Neck: Neck supple.  Cardiovascular: Normal rate and regular rhythm.  Pulmonary/Chest: No respiratory distress. She has no wheezes. She has no rales.  Lungs clear, symmetric breath sounds   Abdominal: She exhibits no distension.  Musculoskeletal: Normal range of motion.  Neurological: She is alert and oriented to person, place, and time.  Able to rise from a chair independently, walk across the exam room, climb onto the exam table independently.  Is able to drive herself to the urgent care independently.  Speech is clear/coherent, face is symmetric. Tongue is midline  Skin: Skin is warm and dry.  Nursing note and vitals reviewed.    UC Treatments / Results   EKG None Radiology No results found.  Procedures Procedures (including critical care time) None today  Final Clinical Impressions(s) / UC Diagnoses   Final diagnoses:  Minor head injury, initial encounter   Push fluids and rest.  Avoid activities  that seem to make dizziness or headache worse.  Symptoms after even a mild brain injury can take time to subside.  Please review the attached handouts for more detailed information.  Follow-up with your primary care provider or a neurologist or sports med provider to discuss further evaluation and management of symptoms if symptoms persist.    Wynona Luna, MD 10/11/17 2042

## 2017-10-09 NOTE — Discharge Instructions (Addendum)
Push fluids and rest.  Avoid activities that seem to make dizziness or headache worse.  Symptoms after even a mild brain injury can take time to subside.  Please review the attached handouts for more detailed information.  Follow-up with your primary care provider or a neurologist or sports med provider to discuss further evaluation and management of symptoms if symptoms persist.

## 2017-10-14 ENCOUNTER — Telehealth: Payer: Self-pay
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
## 2017-10-15 ENCOUNTER — Encounter: Payer: Self-pay | Admitting: Family Medicine

## 2017-10-15 ENCOUNTER — Ambulatory Visit: Payer: 59 | Admitting: Family Medicine

## 2017-10-15 DIAGNOSIS — S0990XA Unspecified injury of head, initial encounter: Secondary | ICD-10-CM | POA: Diagnosis not present

## 2017-10-15 NOTE — Assessment & Plan Note (Signed)
Patient does have more of a head injury.  I believe that there is may be a very mild concussion.  We discussed over-the-counter medications.  Continues to work.  Follow-up again in 96 hours.

## 2017-10-15 NOTE — Patient Instructions (Signed)
Good to see you  Very mild concussion  Fish oil 3 grams daily for 10 days then 2 grams daily thereafter Vitamin D 2000 IU daily  Turmeric 500mg  daily  CoQ10 200mg  daily  Continue these at least for next 2 weeks or symptoms resolve.  I expect some fatigue Call on Monday and give [place] an update

## 2017-10-19 ENCOUNTER — Telehealth: Payer: Self-pay

## 2017-10-19 NOTE — Telephone Encounter (Signed)
Patient has been feeling much better. She is still having dizziness when she turns her head to the right. Per a verbal from Dr. Tamala Julian, no need for patient to follow up at this time but is recommended to call on Wednesday or Thursday to give [place] an update on her progress. Patient voices understanding.

## 2017-10-26 ENCOUNTER — Other Ambulatory Visit: Payer: Self-pay | Admitting: Cardiovascular Disease

## 2017-10-26 NOTE — Telephone Encounter (Signed)
Rx(s) sent to pharmacy electronically.  

## 2017-12-14 ENCOUNTER — Telehealth: Payer: Self-pay

## 2017-12-14 ENCOUNTER — Ambulatory Visit (INDEPENDENT_AMBULATORY_CARE_PROVIDER_SITE_OTHER): Payer: 59 | Admitting: *Deleted

## 2017-12-14 DIAGNOSIS — I442 Atrioventricular block, complete: Secondary | ICD-10-CM

## 2017-12-14 NOTE — Telephone Encounter (Signed)
LMOVM reminding pt to send remote transmission.   

## 2017-12-15 ENCOUNTER — Encounter: Payer: Self-pay | Admitting: Cardiology

## 2017-12-15 LAB — CUP PACEART REMOTE DEVICE CHECK
Battery Remaining Longevity: 67 mo
Brady Statistic AP VS Percent: 0 %
Brady Statistic AS VP Percent: 99 %
Brady Statistic AS VS Percent: 0 %
Date Time Interrogation Session: 20190610203310
Implantable Lead Implant Date: 20100924
Implantable Lead Location: 753860
Implantable Pulse Generator Implant Date: 20100924
Lead Channel Impedance Value: 630 Ohm
Lead Channel Pacing Threshold Amplitude: 0.375 V
Lead Channel Pacing Threshold Amplitude: 0.625 V
Lead Channel Setting Pacing Pulse Width: 0.4 ms
MDC IDC LEAD IMPLANT DT: 20100924
MDC IDC LEAD LOCATION: 753859
MDC IDC MSMT BATTERY IMPEDANCE: 973 Ohm
MDC IDC MSMT BATTERY VOLTAGE: 2.78 V
MDC IDC MSMT LEADCHNL RA IMPEDANCE VALUE: 510 Ohm
MDC IDC MSMT LEADCHNL RA PACING THRESHOLD PULSEWIDTH: 0.4 ms
MDC IDC MSMT LEADCHNL RA SENSING INTR AMPL: 2.8 mV
MDC IDC MSMT LEADCHNL RV PACING THRESHOLD PULSEWIDTH: 0.4 ms
MDC IDC SET LEADCHNL RA PACING AMPLITUDE: 1.5 V
MDC IDC SET LEADCHNL RV PACING AMPLITUDE: 2 V
MDC IDC SET LEADCHNL RV SENSING SENSITIVITY: 4 mV
MDC IDC STAT BRADY AP VP PERCENT: 1 %

## 2017-12-15 NOTE — Progress Notes (Signed)
Remote pacemaker transmission.   

## 2018-02-01 DIAGNOSIS — M545 Low back pain: Secondary | ICD-10-CM | POA: Diagnosis not present

## 2018-02-01 DIAGNOSIS — G8929 Other chronic pain: Secondary | ICD-10-CM | POA: Insufficient documentation

## 2018-02-01 DIAGNOSIS — M5136 Other intervertebral disc degeneration, lumbar region: Secondary | ICD-10-CM | POA: Diagnosis not present

## 2018-02-01 DIAGNOSIS — M5416 Radiculopathy, lumbar region: Secondary | ICD-10-CM | POA: Diagnosis not present

## 2018-02-02 ENCOUNTER — Telehealth: Payer: Self-pay

## 2018-02-02 ENCOUNTER — Other Ambulatory Visit: Payer: Self-pay | Admitting: Physician Assistant

## 2018-02-02 DIAGNOSIS — M5416 Radiculopathy, lumbar region: Secondary | ICD-10-CM

## 2018-02-11 ENCOUNTER — Ambulatory Visit
Admission: RE | Admit: 2018-02-11 | Discharge: 2018-02-11 | Disposition: A | Discharge status: Home / Self Care | Payer: 59 | Source: Ambulatory Visit | Attending: Physician Assistant | Admitting: Physician Assistant

## 2018-02-11 DIAGNOSIS — M48061 Spinal stenosis, lumbar region without neurogenic claudication: Secondary | ICD-10-CM | POA: Diagnosis not present

## 2018-02-11 DIAGNOSIS — M5416 Radiculopathy, lumbar region: Secondary | ICD-10-CM

## 2018-02-11 MED ORDER — ONDANSETRON HCL 4 MG/2ML IJ SOLN: 4.0000 mg | Freq: Four times a day (QID) | INTRAMUSCULAR | Status: DC | PRN

## 2018-02-11 MED ORDER — DIAZEPAM 5 MG PO TABS
10.0000 mg | ORAL_TABLET | Freq: Once | ORAL | Status: AC
  Administered 2018-02-11: 10 mg via ORAL

## 2018-02-11 MED ORDER — IOPAMIDOL (ISOVUE-M 200) INJECTION 41%
20.0000 mL | Freq: Once | INTRAMUSCULAR | Status: AC
  Administered 2018-02-11: 20 mL via INTRATHECAL

## 2018-02-11 MED ORDER — MEPERIDINE HCL 50 MG/ML IJ SOLN
50.0000 mg | Freq: Once | INTRAMUSCULAR | Status: AC
  Administered 2018-02-11: 50 mg via INTRAMUSCULAR

## 2018-02-11 MED ORDER — ONDANSETRON HCL 4 MG/2ML IJ SOLN
4.0000 mg | Freq: Once | INTRAMUSCULAR | Status: AC
  Administered 2018-02-11: 4 mg via INTRAMUSCULAR

## 2018-02-11 NOTE — Progress Notes (Signed)
Patient states she has been off Effexor for at least the past two days.  Gypsy Lore, RN

## 2018-02-11 NOTE — Discharge Instructions (Signed)
Myelogram Discharge Instructions  1. Go home and rest quietly for the next 24 hours.  It is important to lie flat for the next 24 hours.  Get up only to go to the restroom.  You may lie in the bed or on a couch on your back, your stomach, your left side or your right side.  You may have one pillow under your head.  You may have pillows between your knees while you are on your side or under your knees while you are on your back.  2. DO NOT drive today.  Recline the seat as far back as it will go, while still wearing your seat belt, on the way home.  3. You may get up to go to the bathroom as needed.  You may sit up for 10 minutes to eat.  You may resume your normal diet and medications unless otherwise indicated.  Drink lots of extra fluids today and tomorrow.  4. The incidence of headache, nausea, or vomiting is about 5% (one in 20 patients).  If you develop a headache, lie flat and drink plenty of fluids until the headache goes away.  Caffeinated beverages may be helpful.  If you develop severe nausea and vomiting or a headache that does not go away with flat bed rest, call [phone].  5. You may resume normal activities after your 24 hours of bed rest is over; however, do not exert yourself strongly or do any heavy lifting tomorrow. If when you get up you have a headache when standing, go back to bed and force fluids for another 24 hours.  6. Call your physician for a follow-up appointment.  The results of your myelogram will be sent directly to your physician by the following day.  7. If you have any questions or if complications develop after you arrive home, please call [phone].  Discharge instructions have been explained to the patient.  The patient, or the person responsible for the patient, fully understands these instructions.  YOU MAY RESTART YOUR EFFEXOR AND TRAMADOL TOMORROW 02/12/2018 AT 09:30AM.

## 2018-02-12 DIAGNOSIS — M545 Low back pain: Secondary | ICD-10-CM | POA: Diagnosis not present

## 2018-02-15 DIAGNOSIS — M549 Dorsalgia, unspecified: Secondary | ICD-10-CM | POA: Diagnosis not present

## 2018-02-15 DIAGNOSIS — M5416 Radiculopathy, lumbar region: Secondary | ICD-10-CM | POA: Insufficient documentation

## 2018-02-15 DIAGNOSIS — M48062 Spinal stenosis, lumbar region with neurogenic claudication: Secondary | ICD-10-CM | POA: Diagnosis not present

## 2018-02-16 ENCOUNTER — Telehealth: Payer: Self-pay | Admitting: *Deleted

## 2018-02-16 NOTE — Telephone Encounter (Signed)
   [facility] Pre-operative Risk Assessment    Request for surgical clearance:  1. What type of surgery is being performed? LUMBAR DECOMORESSION   2. When is this surgery scheduled? TBS   3. What type of clearance is required (medical clearance vs. Pharmacy clearance to hold med vs. Both)? MEDICAL  4. Are there any medications that need to be held prior to surgery and how long?NONE   5. Practice name and name of physician performing surgery? [facility]   6. What is your office phone number [phone]    7.   What is your office fax number [phone]  8.   Anesthesia type (None, local, MAC, general) ? NOT LISTED   Mandy Sellers 02/16/2018, 4:44 PM  _________________________________________________________________   (provider comments below)

## 2018-02-17 NOTE — Telephone Encounter (Signed)
   Primary Cardiologist: Dr Sallyanne Kuster  Chart reviewed and patient interviewed over the phone today as part of pre-operative protocol coverage. Given past medical history and based on ACC/AHA guidelines, MAQUITA SANDOVAL would be at acceptable risk for the planned procedure without further cardiovascular testing.   OK to hold aspirin if needed pre op.   I will route this recommendation to the requesting party via Epic fax function and remove from pre-op pool.  Please call with questions.  Kerin Ransom, PA-C 02/17/2018, 2:33 PM

## 2018-02-18 DIAGNOSIS — J449 Chronic obstructive pulmonary disease, unspecified: Secondary | ICD-10-CM | POA: Diagnosis not present

## 2018-02-18 DIAGNOSIS — M5431 Sciatica, right side: Secondary | ICD-10-CM | POA: Diagnosis not present

## 2018-03-15 ENCOUNTER — Ambulatory Visit (INDEPENDENT_AMBULATORY_CARE_PROVIDER_SITE_OTHER): Payer: 59 | Admitting: *Deleted

## 2018-03-15 ENCOUNTER — Telehealth: Payer: Self-pay

## 2018-03-15 DIAGNOSIS — I442 Atrioventricular block, complete: Secondary | ICD-10-CM | POA: Diagnosis not present

## 2018-03-15 NOTE — Telephone Encounter (Signed)
LMOVM reminding pt to send remote transmission.   

## 2018-03-16 ENCOUNTER — Encounter: Payer: Self-pay | Admitting: Cardiology

## 2018-03-16 DIAGNOSIS — I10 Essential (primary) hypertension: Secondary | ICD-10-CM | POA: Diagnosis not present

## 2018-03-16 DIAGNOSIS — Z6833 Body mass index (BMI) 33.0-33.9, adult: Secondary | ICD-10-CM | POA: Diagnosis not present

## 2018-03-16 DIAGNOSIS — M5126 Other intervertebral disc displacement, lumbar region: Secondary | ICD-10-CM | POA: Insufficient documentation

## 2018-03-16 NOTE — Progress Notes (Signed)
Remote pacemaker transmission.   

## 2018-03-18 ENCOUNTER — Other Ambulatory Visit: Payer: Self-pay | Admitting: Student

## 2018-03-18 DIAGNOSIS — M5126 Other intervertebral disc displacement, lumbar region: Secondary | ICD-10-CM

## 2018-03-29 ENCOUNTER — Other Ambulatory Visit: Payer: Self-pay | Admitting: Student

## 2018-03-29 ENCOUNTER — Ambulatory Visit
Admission: RE | Admit: 2018-03-29 | Discharge: 2018-03-29 | Disposition: A | Discharge status: Home / Self Care | Payer: 59 | Source: Ambulatory Visit | Attending: Student | Admitting: Student

## 2018-03-29 DIAGNOSIS — M48061 Spinal stenosis, lumbar region without neurogenic claudication: Secondary | ICD-10-CM | POA: Diagnosis not present

## 2018-03-29 DIAGNOSIS — M5126 Other intervertebral disc displacement, lumbar region: Secondary | ICD-10-CM

## 2018-03-29 MED ORDER — METHYLPREDNISOLONE ACETATE 40 MG/ML INJ SUSP (RADIOLOG: 120.0000 mg | Freq: Once | INTRAMUSCULAR | Status: DC

## 2018-03-29 MED ORDER — IOPAMIDOL (ISOVUE-M 200) INJECTION 41%: 1.0000 mL | Freq: Once | INTRAMUSCULAR | Status: DC

## 2018-03-29 NOTE — Discharge Instructions (Signed)

## 2018-04-01 ENCOUNTER — Other Ambulatory Visit: Payer: 59

## 2018-04-06 LAB — CUP PACEART REMOTE DEVICE CHECK
Battery Impedance: 1052 Ohm
Battery Remaining Longevity: 63 mo
Battery Voltage: 2.78 V
Brady Statistic AP VP Percent: 1 %
Brady Statistic AP VS Percent: 0 %
Brady Statistic AS VP Percent: 99 %
Implantable Lead Implant Date: 20100924
Implantable Lead Location: 753860
Implantable Lead Model: 5076
Implantable Lead Model: 5076
Lead Channel Impedance Value: 455 Ohm
Lead Channel Pacing Threshold Amplitude: 0.375 V
Lead Channel Pacing Threshold Amplitude: 0.625 V
Lead Channel Pacing Threshold Pulse Width: 0.4 ms
Lead Channel Pacing Threshold Pulse Width: 0.4 ms
Lead Channel Setting Pacing Amplitude: 1.5 V
Lead Channel Setting Pacing Pulse Width: 0.4 ms
MDC IDC LEAD IMPLANT DT: 20100924
MDC IDC LEAD LOCATION: 753859
MDC IDC MSMT LEADCHNL RV IMPEDANCE VALUE: 551 Ohm
MDC IDC PG IMPLANT DT: 20100924
MDC IDC SESS DTM: 20190909213139
MDC IDC SET LEADCHNL RV PACING AMPLITUDE: 2 V
MDC IDC SET LEADCHNL RV SENSING SENSITIVITY: 4 mV
MDC IDC STAT BRADY AS VS PERCENT: 0 %

## 2018-04-16 DIAGNOSIS — M5440 Lumbago with sciatica, unspecified side: Secondary | ICD-10-CM | POA: Insufficient documentation

## 2018-04-16 DIAGNOSIS — M5126 Other intervertebral disc displacement, lumbar region: Secondary | ICD-10-CM | POA: Diagnosis not present

## 2018-04-21 DIAGNOSIS — Z23 Encounter for immunization: Secondary | ICD-10-CM | POA: Diagnosis not present

## 2018-04-22 ENCOUNTER — Other Ambulatory Visit: Payer: Self-pay | Admitting: Neurosurgery

## 2018-04-22 DIAGNOSIS — M5440 Lumbago with sciatica, unspecified side: Secondary | ICD-10-CM

## 2018-05-05 ENCOUNTER — Ambulatory Visit
Admission: RE | Admit: 2018-05-05 | Discharge: 2018-05-05 | Disposition: A | Discharge status: Home / Self Care | Payer: 59 | Source: Ambulatory Visit | Attending: Neurosurgery | Admitting: Neurosurgery

## 2018-05-05 DIAGNOSIS — M545 Low back pain: Secondary | ICD-10-CM | POA: Diagnosis not present

## 2018-05-05 DIAGNOSIS — M5440 Lumbago with sciatica, unspecified side: Secondary | ICD-10-CM | POA: Diagnosis not present

## 2018-05-05 MED ORDER — METHYLPREDNISOLONE ACETATE 40 MG/ML INJ SUSP (RADIOLOG
120.0000 mg | Freq: Once | INTRAMUSCULAR | Status: AC
  Administered 2018-05-05: 120 mg via EPIDURAL

## 2018-05-05 MED ORDER — IOPAMIDOL (ISOVUE-M 200) INJECTION 41%
1.0000 mL | Freq: Once | INTRAMUSCULAR | Status: AC
  Administered 2018-05-05: 1 mL via EPIDURAL

## 2018-05-05 NOTE — Discharge Instructions (Signed)

## 2018-05-18 DIAGNOSIS — I10 Essential (primary) hypertension: Secondary | ICD-10-CM | POA: Diagnosis not present

## 2018-05-18 DIAGNOSIS — J449 Chronic obstructive pulmonary disease, unspecified: Secondary | ICD-10-CM | POA: Diagnosis not present

## 2018-05-18 DIAGNOSIS — R7301 Impaired fasting glucose: Secondary | ICD-10-CM | POA: Diagnosis not present

## 2018-05-18 DIAGNOSIS — Z0000 Encounter for general adult medical examination without abnormal findings: Secondary | ICD-10-CM | POA: Diagnosis not present

## 2018-05-18 DIAGNOSIS — F17200 Nicotine dependence, unspecified, uncomplicated: Secondary | ICD-10-CM | POA: Diagnosis not present

## 2018-05-21 ENCOUNTER — Other Ambulatory Visit: Payer: Self-pay | Admitting: Internal Medicine

## 2018-05-21 DIAGNOSIS — F17200 Nicotine dependence, unspecified, uncomplicated: Secondary | ICD-10-CM

## 2018-05-26 ENCOUNTER — Ambulatory Visit
Admission: RE | Admit: 2018-05-26 | Discharge: 2018-05-26 | Disposition: A | Discharge status: Home / Self Care | Payer: 59 | Source: Ambulatory Visit | Attending: Internal Medicine | Admitting: Internal Medicine

## 2018-05-26 DIAGNOSIS — F17210 Nicotine dependence, cigarettes, uncomplicated: Secondary | ICD-10-CM | POA: Diagnosis not present

## 2018-05-26 DIAGNOSIS — F17200 Nicotine dependence, unspecified, uncomplicated: Secondary | ICD-10-CM

## 2018-05-28 DIAGNOSIS — M5440 Lumbago with sciatica, unspecified side: Secondary | ICD-10-CM | POA: Diagnosis not present

## 2018-06-07 DIAGNOSIS — Z6834 Body mass index (BMI) 34.0-34.9, adult: Secondary | ICD-10-CM | POA: Diagnosis not present

## 2018-06-07 DIAGNOSIS — Z01419 Encounter for gynecological examination (general) (routine) without abnormal findings: Secondary | ICD-10-CM | POA: Diagnosis not present

## 2018-06-10 DIAGNOSIS — M5440 Lumbago with sciatica, unspecified side: Secondary | ICD-10-CM | POA: Diagnosis not present

## 2018-06-14 ENCOUNTER — Ambulatory Visit (INDEPENDENT_AMBULATORY_CARE_PROVIDER_SITE_OTHER): Payer: 59

## 2018-06-14 DIAGNOSIS — I442 Atrioventricular block, complete: Secondary | ICD-10-CM

## 2018-06-15 DIAGNOSIS — M5440 Lumbago with sciatica, unspecified side: Secondary | ICD-10-CM | POA: Diagnosis not present

## 2018-06-15 NOTE — Progress Notes (Signed)
Remote pacemaker transmission.   

## 2018-06-21 DIAGNOSIS — M5440 Lumbago with sciatica, unspecified side: Secondary | ICD-10-CM | POA: Diagnosis not present

## 2018-06-28 DIAGNOSIS — M5440 Lumbago with sciatica, unspecified side: Secondary | ICD-10-CM | POA: Diagnosis not present

## 2018-07-08 DIAGNOSIS — M5440 Lumbago with sciatica, unspecified side: Secondary | ICD-10-CM | POA: Diagnosis not present

## 2018-07-15 DIAGNOSIS — G5600 Carpal tunnel syndrome, unspecified upper limb: Secondary | ICD-10-CM | POA: Insufficient documentation

## 2018-07-15 DIAGNOSIS — M5440 Lumbago with sciatica, unspecified side: Secondary | ICD-10-CM | POA: Diagnosis not present

## 2018-07-15 DIAGNOSIS — H5203 Hypermetropia, bilateral: Secondary | ICD-10-CM | POA: Diagnosis not present

## 2018-07-15 DIAGNOSIS — H524 Presbyopia: Secondary | ICD-10-CM | POA: Diagnosis not present

## 2018-07-15 DIAGNOSIS — G5603 Carpal tunnel syndrome, bilateral upper limbs: Secondary | ICD-10-CM | POA: Diagnosis not present

## 2018-07-28 LAB — CUP PACEART REMOTE DEVICE CHECK
Battery Impedance: 1157 Ohm
Brady Statistic AP VP Percent: 1 %
Brady Statistic AP VS Percent: 0 %
Brady Statistic AS VS Percent: 0 %
Date Time Interrogation Session: 20191209105943
Implantable Lead Implant Date: 20100924
Implantable Lead Location: 753859
Implantable Lead Location: 753860
Implantable Lead Model: 5076
Lead Channel Impedance Value: 512 Ohm
Lead Channel Pacing Threshold Amplitude: 0.75 V
Lead Channel Pacing Threshold Pulse Width: 0.4 ms
Lead Channel Pacing Threshold Pulse Width: 0.4 ms
Lead Channel Setting Pacing Amplitude: 2 V
Lead Channel Setting Sensing Sensitivity: 4 mV
MDC IDC LEAD IMPLANT DT: 20100924
MDC IDC MSMT BATTERY REMAINING LONGEVITY: 58 mo
MDC IDC MSMT BATTERY VOLTAGE: 2.78 V
MDC IDC MSMT LEADCHNL RA IMPEDANCE VALUE: 468 Ohm
MDC IDC MSMT LEADCHNL RA PACING THRESHOLD AMPLITUDE: 0.375 V
MDC IDC PG IMPLANT DT: 20100924
MDC IDC SET LEADCHNL RA PACING AMPLITUDE: 1.5 V
MDC IDC SET LEADCHNL RV PACING PULSEWIDTH: 0.4 ms
MDC IDC STAT BRADY AS VP PERCENT: 99 %

## 2018-09-08 ENCOUNTER — Other Ambulatory Visit: Payer: Self-pay | Admitting: Cardiovascular Disease

## 2018-09-08 NOTE — Telephone Encounter (Signed)
Rx(s) sent to pharmacy electronically.  

## 2018-09-13 ENCOUNTER — Encounter: Payer: 59 | Admitting: *Deleted

## 2018-09-14 ENCOUNTER — Telehealth: Payer: Self-pay

## 2018-09-14 NOTE — Telephone Encounter (Signed)
Spoke with patient to remind of missed remote transmission 

## 2018-09-22 DIAGNOSIS — M79644 Pain in right finger(s): Secondary | ICD-10-CM | POA: Diagnosis not present

## 2018-09-22 DIAGNOSIS — M189 Osteoarthritis of first carpometacarpal joint, unspecified: Secondary | ICD-10-CM | POA: Insufficient documentation

## 2018-09-22 DIAGNOSIS — G5603 Carpal tunnel syndrome, bilateral upper limbs: Secondary | ICD-10-CM | POA: Insufficient documentation

## 2018-09-22 DIAGNOSIS — G5602 Carpal tunnel syndrome, left upper limb: Secondary | ICD-10-CM | POA: Diagnosis not present

## 2018-09-22 DIAGNOSIS — M65311 Trigger thumb, right thumb: Secondary | ICD-10-CM | POA: Diagnosis not present

## 2018-09-24 ENCOUNTER — Encounter: Payer: Self-pay | Admitting: Cardiology

## 2018-09-28 DIAGNOSIS — R05 Cough: Secondary | ICD-10-CM | POA: Diagnosis not present

## 2018-09-29 DIAGNOSIS — R05 Cough: Secondary | ICD-10-CM | POA: Diagnosis not present

## 2018-10-06 ENCOUNTER — Other Ambulatory Visit: Payer: Self-pay

## 2018-10-06 ENCOUNTER — Ambulatory Visit (INDEPENDENT_AMBULATORY_CARE_PROVIDER_SITE_OTHER): Payer: 59 | Admitting: *Deleted

## 2018-10-06 DIAGNOSIS — I442 Atrioventricular block, complete: Secondary | ICD-10-CM

## 2018-10-06 LAB — CUP PACEART REMOTE DEVICE CHECK
Battery Impedance: 1377 Ohm
Battery Remaining Longevity: 52 mo
Battery Voltage: 2.78 V
Brady Statistic AP VP Percent: 1 %
Brady Statistic AP VS Percent: 0 %
Brady Statistic AS VP Percent: 99 %
Brady Statistic AS VS Percent: 0 %
Date Time Interrogation Session: 20200401080604
Implantable Lead Implant Date: 20100924
Implantable Lead Implant Date: 20100924
Implantable Lead Location: 753859
Implantable Lead Location: 753860
Implantable Lead Model: 5076
Implantable Lead Model: 5076
Implantable Pulse Generator Implant Date: 20100924
Lead Channel Impedance Value: 481 Ohm
Lead Channel Impedance Value: 580 Ohm
Lead Channel Pacing Threshold Amplitude: 0.375 V
Lead Channel Pacing Threshold Amplitude: 0.625 V
Lead Channel Pacing Threshold Pulse Width: 0.4 ms
Lead Channel Pacing Threshold Pulse Width: 0.4 ms
Lead Channel Setting Pacing Amplitude: 1.5 V
Lead Channel Setting Pacing Amplitude: 2 V
Lead Channel Setting Pacing Pulse Width: 0.4 ms
Lead Channel Setting Sensing Sensitivity: 4 mV

## 2018-10-11 ENCOUNTER — Encounter: Payer: Self-pay | Admitting: Cardiology

## 2018-10-11 NOTE — Progress Notes (Signed)
Remote pacemaker transmission.   

## 2018-10-16 DIAGNOSIS — M25571 Pain in right ankle and joints of right foot: Secondary | ICD-10-CM | POA: Diagnosis not present

## 2018-10-27 DIAGNOSIS — G5603 Carpal tunnel syndrome, bilateral upper limbs: Secondary | ICD-10-CM | POA: Diagnosis not present

## 2018-10-27 DIAGNOSIS — G5601 Carpal tunnel syndrome, right upper limb: Secondary | ICD-10-CM | POA: Diagnosis not present

## 2018-10-27 DIAGNOSIS — G5602 Carpal tunnel syndrome, left upper limb: Secondary | ICD-10-CM | POA: Diagnosis not present

## 2018-11-02 ENCOUNTER — Other Ambulatory Visit: Payer: Self-pay | Admitting: Cardiovascular Disease

## 2018-11-03 ENCOUNTER — Ambulatory Visit: Payer: 59 | Admitting: Cardiovascular Disease

## 2018-11-04 ENCOUNTER — Other Ambulatory Visit: Payer: Self-pay

## 2018-11-04 MED ORDER — HYDROCHLOROTHIAZIDE 12.5 MG PO CAPS: 12.5000 mg | ORAL_CAPSULE | Freq: Every day | ORAL | 3 refills | Status: DC

## 2018-12-19 ENCOUNTER — Other Ambulatory Visit: Payer: Self-pay | Admitting: Cardiovascular Disease

## 2019-01-05 ENCOUNTER — Ambulatory Visit (INDEPENDENT_AMBULATORY_CARE_PROVIDER_SITE_OTHER): Payer: 59 | Admitting: *Deleted

## 2019-01-05 ENCOUNTER — Telehealth: Payer: Self-pay

## 2019-01-05 DIAGNOSIS — I442 Atrioventricular block, complete: Secondary | ICD-10-CM | POA: Diagnosis not present

## 2019-01-05 NOTE — Telephone Encounter (Signed)
Spoke with patient to remind of missed remote transmission 

## 2019-01-06 LAB — CUP PACEART REMOTE DEVICE CHECK
Battery Impedance: 1427 Ohm
Battery Remaining Longevity: 51 mo
Battery Voltage: 2.77 V
Brady Statistic AP VP Percent: 1 %
Brady Statistic AP VS Percent: 0 %
Brady Statistic AS VP Percent: 99 %
Brady Statistic AS VS Percent: 0 %
Date Time Interrogation Session: 20200702074047
Implantable Lead Implant Date: 20100924
Implantable Lead Implant Date: 20100924
Implantable Lead Location: 753859
Implantable Lead Location: 753860
Implantable Lead Model: 5076
Implantable Lead Model: 5076
Implantable Pulse Generator Implant Date: 20100924
Lead Channel Impedance Value: 482 Ohm
Lead Channel Impedance Value: 580 Ohm
Lead Channel Pacing Threshold Amplitude: 0.375 V
Lead Channel Pacing Threshold Amplitude: 0.625 V
Lead Channel Pacing Threshold Pulse Width: 0.4 ms
Lead Channel Pacing Threshold Pulse Width: 0.4 ms
Lead Channel Sensing Intrinsic Amplitude: 2.8 mV
Lead Channel Setting Pacing Amplitude: 1.5 V
Lead Channel Setting Pacing Amplitude: 2 V
Lead Channel Setting Pacing Pulse Width: 0.4 ms
Lead Channel Setting Sensing Sensitivity: 4 mV

## 2019-01-14 ENCOUNTER — Encounter: Payer: Self-pay | Admitting: Cardiology

## 2019-01-14 NOTE — Progress Notes (Signed)
Remote pacemaker transmission.   

## 2019-02-03 ENCOUNTER — Telehealth: Payer: Self-pay | Admitting: *Deleted

## 2019-02-03 NOTE — Telephone Encounter (Signed)
Left a message to call back to confirm the appointment for Monday and for prescreening.

## 2019-02-07 ENCOUNTER — Encounter: Payer: Self-pay | Admitting: Cardiovascular Disease

## 2019-02-07 ENCOUNTER — Ambulatory Visit: Payer: 59 | Admitting: Cardiovascular Disease

## 2019-02-07 VITALS — BP 130/90 | HR 73 | Temp 97.2°F | Ht 66.0 in | Wt 197.6 lb

## 2019-02-07 DIAGNOSIS — I442 Atrioventricular block, complete: Secondary | ICD-10-CM | POA: Diagnosis not present

## 2019-02-07 DIAGNOSIS — E669 Obesity, unspecified: Secondary | ICD-10-CM

## 2019-02-07 DIAGNOSIS — I10 Essential (primary) hypertension: Secondary | ICD-10-CM

## 2019-02-07 DIAGNOSIS — E1169 Type 2 diabetes mellitus with other specified complication: Secondary | ICD-10-CM

## 2019-02-07 DIAGNOSIS — Z720 Tobacco use: Secondary | ICD-10-CM

## 2019-02-07 DIAGNOSIS — Z950 Presence of cardiac pacemaker: Secondary | ICD-10-CM | POA: Diagnosis not present

## 2019-02-07 NOTE — Patient Instructions (Addendum)

## 2019-02-07 NOTE — Progress Notes (Signed)
Patient ID: Mandy Sellers, female   DOB: May 05, 1961, 58 y.o.   MRN: [id number]    Cardiology Office Note    Date:  02/07/2019   ID:  Mandy Sellers, DOB 06-14-1961, MRN [id number]  PCP:  Lavone Orn, MD  Cardiologist:   Sanda Klein, MD   Chief Complaint  Patient presents with  . Pacemaker Check    History of Present Illness:  Mandy Sellers is a 58 y.o. female with complete heart block here for a pacemaker check (she does have a very slow ventricular escape rhythm at about 35 bpm, but feels poorly when she has ventricular pacing threshold checks).  The patient specifically denies any chest pain at rest exertion, dyspnea at rest or with exertion, orthopnea, paroxysmal nocturnal dyspnea, syncope, palpitations, focal neurological deficits, intermittent claudication, lower extremity edema, unexplained weight gain, cough, hemoptysis or wheezing.  She has had a lot of problems with right-sided lumbar sciatica.  One specialist recommended surgery, but she improved after getting a back injection.  The relief only lasted for about 3 months and she is having problems again.  She had a trigger finger repair on the right hand.  Roughly 2 years have passed since her husband passed away.  She seems less sad, but still "talks to him every day".  She is living with her daughter and grandson who is 33 years old.  She continues to smoke cigarettes.  Most recent labs from November 2019 showed further deterioration in her glucose control with a hemoglobin A1c that is up to 6.8% (without medication).  I do not have an updated lipid profile (triglycerides were elevated in 2018 but the sample was probably not fasting, cholesterol parameters were excellent).  Pacemaker interrogation shows normal device function with an estimated generator longevity of 4 years, 99.9% atrial sensed ventricular paced rhythm with normal heart rate histograms and no recorded episodes of meaningful arrhythmia. Lead parameters  are excellent and unchanged from historical values.  Her leads are Medtronic [id number] leads implanted in 2007, which are MRI conditional.  The generator is a Medtronic Adapta (not MRI conditional).  Past Medical History:  Diagnosis Date  . CHB (complete heart block) ([facility]) 08/09/2013  . Depression   . HTN (hypertension) 08/09/2013  . Hypertension   . Obesity (BMI 30.0-34.9) 08/09/2013  . Pacemaker    dual-chmber for CHB in 2010  . Tobacco abuse     Past Surgical History:  Procedure Laterality Date  . DILATATION & CURRETTAGE/HYSTEROSCOPY WITH RESECTOCOPE N/A 11/10/2013   Procedure: DILATATION & CURETTAGE/HYSTEROSCOPY WITH MYOSURE;  Surgeon: Darlyn Chamber, MD;  Location: [facility];  Service: Gynecology;  Laterality: N/A;  . INCONTINENCE SURGERY  03/2000  . INSERT / REPLACE / REMOVE PACEMAKER    . NM MYOCAR PERF WALL MOTION  05/2009   bruce myoview; normal pattern of perfusion in all regions, low risk scan  . PACEMAKER INSERTION  03/30/2009   for CHB; Medtronic Adapta ADDRL1 - Dr. Norlene Duel  . TRANSTHORACIC ECHOCARDIOGRAM  09/2010   EF=>55%; mild MR; trace pulm valve regurg   . TUBAL LIGATION  03/2000    Outpatient Medications Prior to Visit  Medication Sig Dispense Refill  . aspirin EC 81 MG tablet Take 81 mg by mouth at bedtime.    Marland Kitchen BYSTOLIC 10 MG tablet TAKE 1 TABLET BY MOUTH  DAILY 90 tablet 0  . Cholecalciferol (VITAMIN D-3) 1000 UNITS CAPS Take 1,000 Units by mouth daily.    . hydrochlorothiazide (MICROZIDE) 12.5 MG capsule  Take 1 capsule (12.5 mg total) by mouth daily. 90 capsule 3  . Multiple Vitamin (MULTIVITAMIN WITH MINERALS) TABS tablet Take 1 tablet by mouth daily.    . Multiple Vitamins-Minerals (AIRBORNE) CHEW Chew 1 tablet by mouth daily.    . Omega-3 Fatty Acids (FISH OIL) 1000 MG CAPS Take 2,000 mg by mouth daily.    Marland Kitchen venlafaxine XR (EFFEXOR-XR) 75 MG 24 hr capsule Take 75 mg by mouth every evening.    . gabapentin (NEURONTIN) 300 MG capsule Take 300 mg by mouth 2 (two) times  daily.    . traMADol (ULTRAM) 50 MG tablet Take 50 mg by mouth 2 (two) times daily.     No facility-administered medications prior to visit.      Allergies:   Chantix [varenicline]   Social History   Socioeconomic History  . Marital status: Married    Spouse name: Not on file  . Number of children: 1  . Years of education: master's  . Highest education level: Not on file  Occupational History    Employer: [employer]  . Financial resource strain: Not on file  . Food insecurity    Worry: Not on file    Inability: Not on file  . Transportation needs    Medical: Not on file    Non-medical: Not on file  Tobacco Use  . Smoking status: Heavy Tobacco Smoker    Packs/day: 1.00    Years: 30.00    Pack years: 30.00    Types: Cigarettes  . Smokeless tobacco: Never Used  Substance and Sexual Activity  . Alcohol use: Yes    Comment: social  . Drug use: No  . Sexual activity: Yes  Lifestyle  . Physical activity    Days per week: Not on file    Minutes per session: Not on file  . Stress: Not on file  Relationships  . Social Herbalist on phone: Not on file    Gets together: Not on file    Attends religious service: Not on file    Active member of club or organization: Not on file    Attends meetings of clubs or organizations: Not on file    Relationship status: Not on file  Other Topics Concern  . Not on file  Social History Narrative  . Not on file     Family History:  The patient's family history includes Alcohol abuse in her father; Deep vein thrombosis in her brother; Diabetes in her father and mother; Hypertension in her father; Stroke in her father.   ROS:   Please see the history of present illness.    ROS All other systems reviewed and are negative.   PHYSICAL EXAM:   VS:  BP 130/90   Pulse 73   Temp (!) 97.2 F (36.2 C)   Ht 5\' 6"  (1.676 m)   Wt 197 lb 9.6 oz (89.6 kg)   LMP 06/15/2013   SpO2 97%   BMI 31.89 kg/m       General: Alert, oriented x3, no distress, obese Head: no evidence of trauma, PERRL, EOMI, no exophtalmos or lid lag, no myxedema, no xanthelasma; normal ears, nose and oropharynx Neck: normal jugular venous pulsations and no hepatojugular reflux; brisk carotid pulses without delay and no carotid bruits Chest: clear to auscultation, no signs of consolidation by percussion or palpation, normal fremitus, symmetrical and full respiratory excursions Cardiovascular: normal position and quality of the apical impulse, regular rhythm, normal  first and second heart sounds, no murmurs, rubs or gallops Abdomen: no tenderness or distention, no masses by palpation, no abnormal pulsatility or arterial bruits, normal bowel sounds, no hepatosplenomegaly Extremities: no clubbing, cyanosis or edema; 2+ radial, ulnar and brachial pulses bilaterally; 2+ right femoral, posterior tibial and dorsalis pedis pulses; 2+ left femoral, posterior tibial and dorsalis pedis pulses; no subclavian or femoral bruits Neurological: grossly nonfocal Psych: Normal mood and affect   Wt Readings from Last 3 Encounters:  02/07/19 197 lb 9.6 oz (89.6 kg)  10/15/17 212 lb (96.2 kg)  09/09/17 209 lb 9.6 oz (95.1 kg)      Studies/Labs Reviewed:   EKG:  EKG is ordered today.  It shows atrial sensed, ventricular paced rhythm, QRS 140 ms, QTC 463 ms Recent Labs: May 14, 2017 total cholesterol 155, HDL 40, LDL 54, triglycerides 306 (nonfasting), hemoglobin A1c 6.5%, hemoglobin 15.2, creatinine 0.74, potassium 4.0  May 17, 2018 creatinine 0.78, hemoglobin A1c 6.8%, normal liver function tests and electrolytes; no lipid profile was performed  ASSESSMENT:    1. CHB (complete heart block) ([place])   2. Pacemaker   3. Essential hypertension   4. Obesity (BMI 30.0-34.9)   5. Tobacco abuse   6. Diabetes mellitus type 2 in obese [facility])      PLAN:  In order of problems listed above:  1. CHB: Pacemaker dependent 2. PPM: Normal  device function.  Remote downloads every 3 months and yearly office visits.  Notes that the CT shows that her right ventricular pacemaker lead is actually located in the middle cardiac vein.  Both the atrial and ventricular leads are MRI conditional but her Medtronic Adapta generator is not.  If it is critical to have an MRI, we could probably make arrangements for scan with appropriate supervision, but ideally would avoid MRI scans until we get her new generator in about 40 years.   3. HTN: Diastolic blood pressure is a little high, usually better.  No changes made to her medications today. 4. Obesity: Weight loss encouraged. 5. DM: A1c of 6.8% is consistent with full-blown diabetes mellitus, although as of yet she does not require medications.  Her A1c has been steadily increasing over the last few years.  Weight loss and a diet low in free sugars and high glycemic index starches is recommended. 6. HLP: elevated TG in Nov 2018 (possibly was not fasting). Cholesterol was OK. 7. Smoking cessation strongly recommended, especially since she has many other risk factors for coronary/vascular disease including diabetes mellitus and obesity and hypertension   Medication Adjustments/Labs and Tests Ordered: Current medicines are reviewed at length with the patient today.  Concerns regarding medicines are outlined above.  Medication changes, Labs and Tests ordered today are listed in the Patient Instructions below. Patient Instructions  Medication Instructions:  Your physician recommends that you continue on your current medications as directed. Please refer to the Current Medication list given to you today.  If you need a refill on your cardiac medications before your next appointment, please call your pharmacy.   Lab work: None ordered If you have labs (blood work) drawn today and your tests are completely normal, you will receive your results only by: [place] (if you have MyChart) OR A paper  copy in the mail If you have any lab test that is abnormal or we need to change your treatment, we will call you to review the results.  Testing/Procedures: None ordered  Follow-Up: At [facility], you and your health  needs are our priority.  As part of our continuing mission to provide you with exceptional heart care, we have created designated Provider Care Teams.  These Care Teams include your primary Cardiologist (physician) and Advanced Practice Providers (APPs -  Physician Assistants and Nurse Practitioners) who all work together to provide you with the care you need, when you need it. You will need a follow up appointment in 12 months.  Please call our office 2 months in advance to schedule this appointment.  You may see Sanda Klein, MD or one of the following Advanced Practice Providers on your designated Care Team: Almyra Deforest, PA-C Fabian Sharp, [place]           Signed, Sanda Klein, MD  02/07/2019 9:07 AM    [address], [place], [place]  [address] Phone: [phone]; Fax: [phone]

## 2019-02-08 ENCOUNTER — Other Ambulatory Visit: Payer: Self-pay

## 2019-02-08 DIAGNOSIS — I442 Atrioventricular block, complete: Secondary | ICD-10-CM

## 2019-02-08 DIAGNOSIS — I10 Essential (primary) hypertension: Secondary | ICD-10-CM

## 2019-02-08 DIAGNOSIS — Z950 Presence of cardiac pacemaker: Secondary | ICD-10-CM

## 2019-02-18 ENCOUNTER — Other Ambulatory Visit: Payer: Self-pay | Admitting: Student

## 2019-02-18 DIAGNOSIS — M5126 Other intervertebral disc displacement, lumbar region: Secondary | ICD-10-CM

## 2019-02-24 ENCOUNTER — Other Ambulatory Visit: Payer: Self-pay | Admitting: Student

## 2019-02-24 ENCOUNTER — Other Ambulatory Visit: Payer: Self-pay

## 2019-02-24 ENCOUNTER — Ambulatory Visit
Admission: RE | Admit: 2019-02-24 | Discharge: 2019-02-24 | Disposition: A | Discharge status: Home / Self Care | Payer: 59 | Source: Ambulatory Visit | Attending: Student | Admitting: Student

## 2019-02-24 DIAGNOSIS — M5126 Other intervertebral disc displacement, lumbar region: Secondary | ICD-10-CM

## 2019-02-24 MED ORDER — METHYLPREDNISOLONE ACETATE 40 MG/ML INJ SUSP (RADIOLOG
120.0000 mg | Freq: Once | INTRAMUSCULAR | Status: AC
  Administered 2019-02-24: 120 mg via EPIDURAL

## 2019-02-24 MED ORDER — IOPAMIDOL (ISOVUE-M 200) INJECTION 41%
1.0000 mL | Freq: Once | INTRAMUSCULAR | Status: AC
  Administered 2019-02-24: 13:00:00 1 mL via EPIDURAL

## 2019-02-24 NOTE — Discharge Instructions (Signed)

## 2019-03-19 ENCOUNTER — Other Ambulatory Visit: Payer: Self-pay | Admitting: Cardiovascular Disease

## 2019-04-06 ENCOUNTER — Ambulatory Visit (INDEPENDENT_AMBULATORY_CARE_PROVIDER_SITE_OTHER): Payer: 59 | Admitting: *Deleted

## 2019-04-06 DIAGNOSIS — I442 Atrioventricular block, complete: Secondary | ICD-10-CM

## 2019-04-11 LAB — CUP PACEART REMOTE DEVICE CHECK
Battery Impedance: 1569 Ohm
Battery Remaining Longevity: 47 mo
Battery Voltage: 2.77 V
Brady Statistic AP VP Percent: 1 %
Brady Statistic AP VS Percent: 0 %
Brady Statistic AS VP Percent: 99 %
Brady Statistic AS VS Percent: 0 %
Date Time Interrogation Session: 20201005091726
Implantable Lead Implant Date: 20100924
Implantable Lead Implant Date: 20100924
Implantable Lead Location: 753859
Implantable Lead Location: 753860
Implantable Lead Model: 5076
Implantable Lead Model: 5076
Implantable Pulse Generator Implant Date: 20100924
Lead Channel Impedance Value: 445 Ohm
Lead Channel Impedance Value: 546 Ohm
Lead Channel Pacing Threshold Amplitude: 0.375 V
Lead Channel Pacing Threshold Amplitude: 0.75 V
Lead Channel Pacing Threshold Pulse Width: 0.4 ms
Lead Channel Pacing Threshold Pulse Width: 0.4 ms
Lead Channel Setting Pacing Amplitude: 1.5 V
Lead Channel Setting Pacing Amplitude: 2 V
Lead Channel Setting Pacing Pulse Width: 0.4 ms
Lead Channel Setting Sensing Sensitivity: 4 mV

## 2019-04-12 ENCOUNTER — Encounter: Payer: Self-pay | Admitting: Cardiology

## 2019-04-12 NOTE — Progress Notes (Signed)
Remote pacemaker transmission.   

## 2019-05-23 ENCOUNTER — Other Ambulatory Visit: Payer: Self-pay

## 2019-05-23 ENCOUNTER — Emergency Department (HOSPITAL_COMMUNITY): Admission: EM | Admit: 2019-05-23 | Discharge: 2019-05-24 | Discharge status: Against Medical Advice | Payer: 59

## 2019-05-23 MED ORDER — SODIUM CHLORIDE 0.9% FLUSH: 3.0000 mL | Freq: Once | INTRAVENOUS | Status: DC

## 2019-05-24 ENCOUNTER — Emergency Department (HOSPITAL_COMMUNITY)
Admission: EM | Admit: 2019-05-24 | Discharge: 2019-05-24 | Disposition: A | Discharge status: Home / Self Care | Payer: 59 | Attending: Emergency Medicine | Admitting: Emergency Medicine

## 2019-05-24 ENCOUNTER — Emergency Department (HOSPITAL_COMMUNITY): Payer: 59

## 2019-05-24 ENCOUNTER — Other Ambulatory Visit: Payer: Self-pay

## 2019-05-24 ENCOUNTER — Encounter (HOSPITAL_COMMUNITY): Payer: Self-pay

## 2019-05-24 DIAGNOSIS — F17210 Nicotine dependence, cigarettes, uncomplicated: Secondary | ICD-10-CM | POA: Insufficient documentation

## 2019-05-24 DIAGNOSIS — Z950 Presence of cardiac pacemaker: Secondary | ICD-10-CM | POA: Diagnosis not present

## 2019-05-24 DIAGNOSIS — R509 Fever, unspecified: Secondary | ICD-10-CM | POA: Insufficient documentation

## 2019-05-24 DIAGNOSIS — R531 Weakness: Secondary | ICD-10-CM | POA: Diagnosis not present

## 2019-05-24 DIAGNOSIS — I10 Essential (primary) hypertension: Secondary | ICD-10-CM

## 2019-05-24 DIAGNOSIS — Z79899 Other long term (current) drug therapy: Secondary | ICD-10-CM | POA: Diagnosis not present

## 2019-05-24 DIAGNOSIS — Z7982 Long term (current) use of aspirin: Secondary | ICD-10-CM | POA: Diagnosis not present

## 2019-05-24 DIAGNOSIS — R519 Headache, unspecified: Secondary | ICD-10-CM | POA: Insufficient documentation

## 2019-05-24 DIAGNOSIS — E119 Type 2 diabetes mellitus without complications: Secondary | ICD-10-CM | POA: Diagnosis not present

## 2019-05-24 LAB — CBC WITH DIFFERENTIAL/PLATELET
Abs Immature Granulocytes: 0.05 10*3/uL (ref 0.00–0.07)
Basophils Absolute: 0 10*3/uL (ref 0.0–0.1)
Basophils Relative: 0 %
Eosinophils Absolute: 0 10*3/uL (ref 0.0–0.5)
Eosinophils Relative: 0 %
HCT: 46.8 % — ABNORMAL HIGH (ref 36.0–46.0)
Hemoglobin: 15.8 g/dL — ABNORMAL HIGH (ref 12.0–15.0)
Immature Granulocytes: 0 %
Lymphocytes Relative: 12 %
Lymphs Abs: 1.5 10*3/uL (ref 0.7–4.0)
MCH: 30.6 pg (ref 26.0–34.0)
MCHC: 33.8 g/dL (ref 30.0–36.0)
MCV: 90.7 fL (ref 80.0–100.0)
Monocytes Absolute: 0.8 10*3/uL (ref 0.1–1.0)
Monocytes Relative: 6 %
Neutro Abs: 10.2 10*3/uL — ABNORMAL HIGH (ref 1.7–7.7)
Neutrophils Relative %: 82 %
Platelets: 207 10*3/uL (ref 150–400)
RBC: 5.16 MIL/uL — ABNORMAL HIGH (ref 3.87–5.11)
RDW: 13.1 % (ref 11.5–15.5)
WBC: 12.6 10*3/uL — ABNORMAL HIGH (ref 4.0–10.5)
nRBC: 0 % (ref 0.0–0.2)

## 2019-05-24 LAB — URINALYSIS, ROUTINE W REFLEX MICROSCOPIC
Bacteria, UA: NONE SEEN
Bilirubin Urine: NEGATIVE
Glucose, UA: NEGATIVE mg/dL
Ketones, ur: NEGATIVE mg/dL
Leukocytes,Ua: NEGATIVE
Nitrite: NEGATIVE
Protein, ur: NEGATIVE mg/dL
Specific Gravity, Urine: 1.013 (ref 1.005–1.030)
pH: 6 (ref 5.0–8.0)

## 2019-05-24 LAB — BASIC METABOLIC PANEL
Anion gap: 11 (ref 5–15)
BUN: 15 mg/dL (ref 6–20)
CO2: 24 mmol/L (ref 22–32)
Calcium: 9.5 mg/dL (ref 8.9–10.3)
Chloride: 102 mmol/L (ref 98–111)
Creatinine, Ser: 0.7 mg/dL (ref 0.44–1.00)
GFR calc Af Amer: >60 mL/min (ref >60)
GFR calc non Af Amer: >60 mL/min (ref >60)
Glucose, Bld: 151 mg/dL — ABNORMAL HIGH (ref 70–99)
Potassium: 3.6 mmol/L (ref 3.5–5.1)
Sodium: 137 mmol/L (ref 135–145)

## 2019-05-24 MED ORDER — ETODOLAC 300 MG PO CAPS: 300.0000 mg | ORAL_CAPSULE | Freq: Three times a day (TID) | ORAL | 0 refills | Status: DC

## 2019-05-24 MED ORDER — SODIUM CHLORIDE 0.9 % IV BOLUS (SEPSIS)
1000.0000 mL | Freq: Once | INTRAVENOUS | Status: AC
  Administered 2019-05-24: 1000 mL via INTRAVENOUS

## 2019-05-24 MED ORDER — PROCHLORPERAZINE EDISYLATE 10 MG/2ML IJ SOLN
10.0000 mg | Freq: Once | INTRAMUSCULAR | Status: AC
  Administered 2019-05-24: 10 mg via INTRAVENOUS
  Filled 2019-05-24: qty 2

## 2019-05-24 MED ORDER — MORPHINE SULFATE (PF) 4 MG/ML IV SOLN
4.0000 mg | Freq: Once | INTRAVENOUS | Status: AC
  Administered 2019-05-24: 4 mg via INTRAVENOUS
  Filled 2019-05-24: qty 1

## 2019-05-24 MED ORDER — SODIUM CHLORIDE 0.9 % IV SOLN
1000.0000 mL | INTRAVENOUS | Status: DC
  Administered 2019-05-24: 1000 mL via INTRAVENOUS

## 2019-05-24 NOTE — ED Triage Notes (Addendum)
Pt arrives POV from home with complaints of headache, chills and weakness since Sunday. Pt reports "fevers" at home with temp of 99.6. Pt additionally concerned with UTI due to having confusion and disorientation. Pt had COVID test this am- pending result

## 2019-05-24 NOTE — ED Notes (Signed)
Pt given ice pack for headache.

## 2019-05-24 NOTE — ED Provider Notes (Signed)
[facility] DEPT Provider Note   CSN: [id number] Arrival date & time: 05/24/19  1139     History   Chief Complaint Chief Complaint  Patient presents with  . Headache  . Emesis    HPI Mandy Sellers is a 58 y.o. female.     HPI Pt started vomiting on Sunday.  SHe had low grade temp of 99.6  Pt started having chills then developed a headache in the front of her head.  Those sx have persisted.  She thought maybe she had a sinus infection.  Pt went to the public health dept today and had a covid test.  No dysuria.  Slight cough.   No numbness but generalized weakness.  She denies neck pain.   Past Medical History:  Diagnosis Date  . CHB (complete heart block) ([place]) 08/09/2013  . Depression   . HTN (hypertension) 08/09/2013  . Hypertension   . Obesity (BMI 30.0-34.9) 08/09/2013  . Pacemaker    dual-chmber for CHB in 2010  . Tobacco abuse     Patient Active Problem List   Diagnosis Date Noted  . Diabetes mellitus type 2 in obese ([place]) 02/07/2019  . Head injury 10/15/2017  . Endometrial thickening on ultra sound 11/10/2013    Class: Present on Admission  . Status post hysteroscopy 11/10/2013    Class: Status post  . CHB (complete heart block) ([place]) 08/09/2013  . Pacemaker - dual chamber Medtronic 2010 08/09/2013  . Tobacco abuse 08/09/2013  . Obesity (BMI 30.0-34.9) 08/09/2013  . HTN (hypertension) 08/09/2013    Past Surgical History:  Procedure Laterality Date  . DILATATION & CURRETTAGE/HYSTEROSCOPY WITH RESECTOCOPE N/A 11/10/2013   Procedure: DILATATION & CURETTAGE/HYSTEROSCOPY WITH MYOSURE;  Surgeon: Darlyn Chamber, MD;  Location: [facility];  Service: Gynecology;  Laterality: N/A;  . INCONTINENCE SURGERY  03/2000  . INSERT / REPLACE / REMOVE PACEMAKER    . NM MYOCAR PERF WALL MOTION  05/2009   bruce myoview; normal pattern of perfusion in all regions, low risk scan  . PACEMAKER INSERTION  03/30/2009   for CHB; Medtronic Adapta ADDRL1 - Dr. Norlene Duel  . TRANSTHORACIC ECHOCARDIOGRAM  09/2010   EF=>55%; mild MR; trace pulm valve regurg   . TUBAL LIGATION  03/2000     OB History   No obstetric history on file.      Home Medications    Prior to Admission medications   Medication Sig Start Date End Date Taking? Authorizing Provider  aspirin EC 81 MG tablet Take 81 mg by mouth at bedtime.   Yes [provider]  BYSTOLIC 10 MG tablet TAKE 1 TABLET BY MOUTH  DAILY Patient taking differently: Take 10 mg by mouth daily.  03/21/19  Yes Croitoru, Mihai, MD  Cholecalciferol (VITAMIN D-3) 1000 UNITS CAPS Take 1,000 Units by mouth daily.   Yes [provider]  hydrochlorothiazide (MICROZIDE) 12.5 MG capsule Take 1 capsule (12.5 mg total) by mouth daily. 11/04/18  Yes Croitoru, Mihai, MD  Multiple Vitamin (MULTIVITAMIN WITH MINERALS) TABS tablet Take 1 tablet by mouth daily.   Yes [provider]  Multiple Vitamins-Minerals (AIRBORNE) CHEW Chew 1 tablet by mouth daily.   Yes [provider]  Omega-3 Fatty Acids (FISH OIL) 1000 MG CAPS Take 2,000 mg by mouth daily.   Yes [provider]  venlafaxine XR (EFFEXOR-XR) 75 MG 24 hr capsule Take 75 mg by mouth every evening.   Yes [provider]  etodolac (LODINE) 300 MG  capsule Take 1 capsule (300 mg total) by mouth every 8 (eight) hours. 05/24/19   Dorie Rank, MD    Family History Family History  Problem Relation Age of Onset  . Diabetes Mother   . Diabetes Father   . Alcohol abuse Father   . Hypertension Father   . Stroke Father   . Deep vein thrombosis Brother     Social History Social History   Tobacco Use  . Smoking status: Heavy Tobacco Smoker    Packs/day: 1.00    Years: 30.00    Pack years: 30.00    Types: Cigarettes  . Smokeless tobacco: Never Used  Substance Use Topics  . Alcohol use: Yes    Comment: social  . Drug use: No     Allergies   Chantix [varenicline]   Review of Systems Review of Systems  All  other systems reviewed and are negative.    Physical Exam Updated Vital Signs BP (!) 166/82   Pulse 65   Temp 97.6 F (36.4 C) (Oral)   Resp 16   Wt 90.7 kg   LMP 06/15/2013   SpO2 94%   BMI 32.28 kg/m   Physical Exam Vitals signs and nursing note reviewed.  Constitutional:      General: She is not in acute distress.    Appearance: She is well-developed.  HENT:     Head: Normocephalic and atraumatic.     Right Ear: External ear normal.     Left Ear: External ear normal.  Eyes:     General: No scleral icterus.       Right eye: No discharge.        Left eye: No discharge.     Conjunctiva/sclera: Conjunctivae normal.  Neck:     Musculoskeletal: Neck supple. No neck rigidity.     Trachea: No tracheal deviation.     Meningeal: Kernig's sign absent.  Cardiovascular:     Rate and Rhythm: Normal rate and regular rhythm.  Pulmonary:     Effort: Pulmonary effort is normal. No respiratory distress.     Breath sounds: Normal breath sounds. No stridor. No wheezing or rales.  Abdominal:     General: Bowel sounds are normal. There is no distension.     Palpations: Abdomen is soft.     Tenderness: There is no abdominal tenderness. There is no guarding or rebound.  Musculoskeletal:        General: No tenderness.  Lymphadenopathy:     Cervical: No cervical adenopathy.  Skin:    General: Skin is warm and dry.     Findings: No rash.  Neurological:     Mental Status: She is alert.     Cranial Nerves: No cranial nerve deficit (no facial droop, extraocular movements intact, no slurred speech).     Sensory: No sensory deficit.     Motor: No weakness, abnormal muscle tone or seizure activity.     Coordination: Coordination normal.     Comments: Normal strength and sensation throughout      ED Treatments / Results  Labs (all labs ordered are listed, but only abnormal results are displayed) Labs Reviewed  CBC WITH DIFFERENTIAL/PLATELET - Abnormal; Notable for the following  components:      Result Value   WBC 12.6 (*)    RBC 5.16 (*)    Hemoglobin 15.8 (*)    HCT 46.8 (*)    Neutro Abs 10.2 (*)    All other components within normal limits  BASIC METABOLIC  PANEL - Abnormal; Notable for the following components:   Glucose, Bld 151 (*)    All other components within normal limits  URINALYSIS, ROUTINE W REFLEX MICROSCOPIC - Abnormal; Notable for the following components:   Hgb urine dipstick SMALL (*)    All other components within normal limits    EKG None  Radiology Ct Head Wo Contrast  Result Date: 05/24/2019 CLINICAL DATA:  Headache, acute, severe, worse HA of life. Additional history provided: Headache, chills and weakness since Sunday. Patient reports fever at home. EXAM: CT HEAD WITHOUT CONTRAST TECHNIQUE: Contiguous axial images were obtained from the base of the skull through the vertex without intravenous contrast. COMPARISON:  Head CT 03/29/2009 FINDINGS: Brain: No evidence of acute intracranial hemorrhage. No demarcated cortical infarction. No evidence of intracranial mass. No midline shift or extra-axial fluid collection. Cerebral volume is normal for age. Vascular: No hyperdense vessel. Skull: Normal. Negative for fracture or focal lesion. Sinuses/Orbits: Visualized orbits demonstrate no acute abnormality. Tiny mucous retention cyst extending from the left nasal passage into the left maxillary sinus. IMPRESSION: No evidence of acute intracranial abnormality. Electronically Signed   By: Kellie Simmering DO   On: 05/24/2019 15:16   Dg Chest Portable 1 View  Result Date: 05/24/2019 CLINICAL DATA:  Fever. EXAM: PORTABLE CHEST 1 VIEW COMPARISON:  August 17, 2015. FINDINGS: Stable cardiomediastinal silhouette. Left-sided pacemaker is unchanged in position. No pneumothorax or pleural effusion is noted. No acute pulmonary disease is noted. Bony thorax is unremarkable. IMPRESSION: No active disease. Electronically Signed   By: Marijo Conception M.D.   On:  05/24/2019 13:26    Procedures Procedures (including critical care time)  Medications Ordered in ED Medications  sodium chloride 0.9 % bolus 1,000 mL (0 mLs Intravenous Stopped 05/24/19 1427)    Followed by  0.9 %  sodium chloride infusion (1,000 mLs Intravenous New Bag/Given 05/24/19 1528)  prochlorperazine (COMPAZINE) injection 10 mg (10 mg Intravenous Given 05/24/19 1354)  morphine 4 MG/ML injection 4 mg (4 mg Intravenous Given 05/24/19 1354)     Initial Impression / Assessment and Plan / ED Course  I have reviewed the triage vital signs and the nursing notes.  Pertinent labs & imaging results that were available during my care of the patient were reviewed by me and considered in my medical decision making (see chart for details).  Clinical Course as of May 23 1656  Tue May 24, 2019  1537 Discussed doing an LP to evaluate for meningitis. Pt does not want to have that done right now.  She is feeling better after treatment.  Daughter let her know her outpt covid was negative   [JK]    Clinical Course User Index [JK] Dorie Rank, MD     Patient presented with complaints of headache low-grade fever and myalgias.  ED work-up notable for slight leukocytosis.  CT scan without acute findings.  I did discuss concern of possible meningitis causing her headache and symptoms.  Patient is nontoxic and I I suspect viral meningitis of anything is more likely.  Discussed performing a lumbar puncture to evaluate this further.  Patient does not want to have that done.  She is feeling better.  She understands to return if she starts having worsening symptoms.  Otherwise recommend NSAIDs.  Is possible she has a viral illness.  Mandy Sellers was evaluated in Emergency Department on 05/24/2019 for the symptoms described in the history of present illness. She was evaluated in the context of the global  COVID-19 pandemic, which necessitated consideration that the patient might be at risk for infection  with the SARS-CoV-2 virus that causes COVID-19. Institutional protocols and algorithms that pertain to the evaluation of patients at risk for COVID-19 are in a state of rapid change based on information released by regulatory bodies including the CDC and federal and state organizations. These policies and algorithms were followed during the patient's care in the ED.   Final Clinical Impressions(s) / ED Diagnoses   Final diagnoses:  Acute nonintractable headache, unspecified headache type  Hypertension, unspecified type    ED Discharge Orders         Ordered    etodolac (LODINE) 300 MG capsule  Every 8 hours    Note to Pharmacy: As needed for pain   05/24/19 1656           Dorie Rank, MD 05/24/19 1659

## 2019-05-24 NOTE — Discharge Instructions (Signed)
Follow-up with your primary care doctor to be rechecked if your symptoms are not improving.  Return to the ED as we discussed for further evaluation if you start feeling worse.

## 2019-07-06 ENCOUNTER — Telehealth: Payer: Self-pay

## 2019-07-06 ENCOUNTER — Ambulatory Visit (INDEPENDENT_AMBULATORY_CARE_PROVIDER_SITE_OTHER): Payer: 59 | Admitting: *Deleted

## 2019-07-06 DIAGNOSIS — I442 Atrioventricular block, complete: Secondary | ICD-10-CM

## 2019-07-06 LAB — CUP PACEART REMOTE DEVICE CHECK
Battery Impedance: 1685 Ohm
Battery Remaining Longevity: 45 mo
Battery Voltage: 2.77 V
Brady Statistic AP VP Percent: 1 %
Brady Statistic AP VS Percent: 0 %
Brady Statistic AS VP Percent: 99 %
Brady Statistic AS VS Percent: 0 %
Date Time Interrogation Session: 20201230093348
Implantable Lead Implant Date: 20100924
Implantable Lead Implant Date: 20100924
Implantable Lead Location: 753859
Implantable Lead Location: 753860
Implantable Lead Model: 5076
Implantable Lead Model: 5076
Implantable Pulse Generator Implant Date: 20100924
Lead Channel Impedance Value: 488 Ohm
Lead Channel Impedance Value: 574 Ohm
Lead Channel Pacing Threshold Amplitude: 0.375 V
Lead Channel Pacing Threshold Amplitude: 0.75 V
Lead Channel Pacing Threshold Pulse Width: 0.4 ms
Lead Channel Pacing Threshold Pulse Width: 0.4 ms
Lead Channel Setting Pacing Amplitude: 1.5 V
Lead Channel Setting Pacing Amplitude: 2 V
Lead Channel Setting Pacing Pulse Width: 0.4 ms
Lead Channel Setting Sensing Sensitivity: 4 mV

## 2019-07-06 NOTE — Telephone Encounter (Signed)
Discussed with patient at length.    She states when she was sending her transmission when the "third light" lit up she felt her heart pounding.  I re-assured her that her sending a transmission was a read only event, and would in no way effect the programming of her pacemaker.    He has history of PVCs per her device interrogation that likely explains her symptoms. Pt knows to call back with any worsening symptoms.   Legrand Como [address] [place], PA-C  07/06/2019 10:40 AM

## 2019-07-06 NOTE — Telephone Encounter (Signed)
The pt felt like 2 or 4 knocks in her chest. She would like for someone to look at the transmission to make sure everything is okay. I told her Jonni Sanger reviewed her transmission and did not see anything. I told her to send one more for Jonni Sanger to review to make sure he did not miss anything. I told her once we receive it he will give her a call back.

## 2019-07-08 HISTORY — PX: LUMBAR SPINE SURGERY: SHX701

## 2019-07-14 ENCOUNTER — Other Ambulatory Visit: Payer: Self-pay | Admitting: Student

## 2019-07-14 ENCOUNTER — Telehealth: Payer: Self-pay | Admitting: Nurse Practitioner

## 2019-07-14 DIAGNOSIS — M5440 Lumbago with sciatica, unspecified side: Secondary | ICD-10-CM

## 2019-07-14 NOTE — Telephone Encounter (Signed)
Phone call to patient to verify medication list and allergies for myelogram procedure. Pt instructed to hold effexor for 48hrs prior to myelogram appointment time. Pt verbalized understanding. Pre and post procedure instructions reviewed with pt.

## 2019-07-18 ENCOUNTER — Other Ambulatory Visit: Payer: Self-pay | Admitting: Student

## 2019-07-18 DIAGNOSIS — M5440 Lumbago with sciatica, unspecified side: Secondary | ICD-10-CM

## 2019-07-19 ENCOUNTER — Ambulatory Visit
Admission: RE | Admit: 2019-07-19 | Discharge: 2019-07-19 | Disposition: A | Discharge status: Home / Self Care | Payer: 59 | Source: Ambulatory Visit | Attending: Student | Admitting: Student

## 2019-07-19 DIAGNOSIS — M5440 Lumbago with sciatica, unspecified side: Secondary | ICD-10-CM

## 2019-07-19 MED ORDER — METHYLPREDNISOLONE ACETATE 40 MG/ML INJ SUSP (RADIOLOG
120.0000 mg | Freq: Once | INTRAMUSCULAR | Status: AC
  Administered 2019-07-19: 120 mg via EPIDURAL

## 2019-07-19 MED ORDER — IOPAMIDOL (ISOVUE-M 200) INJECTION 41%
1.0000 mL | Freq: Once | INTRAMUSCULAR | Status: AC
  Administered 2019-07-19: 1 mL via EPIDURAL

## 2019-07-19 NOTE — Discharge Instructions (Signed)

## 2019-07-26 ENCOUNTER — Ambulatory Visit
Admission: RE | Admit: 2019-07-26 | Discharge: 2019-07-26 | Disposition: A | Discharge status: Home / Self Care | Payer: 59 | Source: Ambulatory Visit | Attending: Student | Admitting: Student

## 2019-07-26 DIAGNOSIS — M5440 Lumbago with sciatica, unspecified side: Secondary | ICD-10-CM

## 2019-07-26 MED ORDER — DIAZEPAM 5 MG PO TABS
5.0000 mg | ORAL_TABLET | Freq: Once | ORAL | Status: AC
  Administered 2019-07-26: 5 mg via ORAL

## 2019-07-26 MED ORDER — IOPAMIDOL (ISOVUE-M 200) INJECTION 41%
15.0000 mL | Freq: Once | INTRAMUSCULAR | Status: AC
  Administered 2019-07-26: 15 mL via INTRATHECAL

## 2019-07-26 NOTE — Discharge Instructions (Signed)
Myelogram Discharge Instructions  1. Go home and rest quietly for the next 24 hours.  It is important to lie flat for the next 24 hours.  Get up only to go to the restroom.  You may lie in the bed or on a couch on your back, your stomach, your left side or your right side.  You may have one pillow under your head.  You may have pillows between your knees while you are on your side or under your knees while you are on your back.  2. DO NOT drive today.  Recline the seat as far back as it will go, while still wearing your seat belt, on the way home.  3. You may get up to go to the bathroom as needed.  You may sit up for 10 minutes to eat.  You may resume your normal diet and medications unless otherwise indicated.  Drink lots of extra fluids today and tomorrow.  4. The incidence of headache, nausea, or vomiting is about 5% (one in 20 patients).  If you develop a headache, lie flat and drink plenty of fluids until the headache goes away.  Caffeinated beverages may be helpful.  If you develop severe nausea and vomiting or a headache that does not go away with flat bed rest, call [phone].  5. You may resume normal activities after your 24 hours of bed rest is over; however, do not exert yourself strongly or do any heavy lifting tomorrow. If when you get up you have a headache when standing, go back to bed and force fluids for another 24 hours.  6. Call your physician for a follow-up appointment.  The results of your myelogram will be sent directly to your physician by the following day.  7. If you have any questions or if complications develop after you arrive home, please call [phone].  Discharge instructions have been explained to the patient.  The patient, or the person responsible for the patient, fully understands these instructions.  YOU MAY RESTART YOUR [facility] TOMORROW 07/27/19 AT 09:30AM.

## 2019-07-26 NOTE — Progress Notes (Signed)
Patient states she has been off Effexor for at least the past two days.

## 2019-07-28 ENCOUNTER — Telehealth: Payer: Self-pay | Admitting: *Deleted

## 2019-07-28 NOTE — Telephone Encounter (Signed)
The patient has been called and made aware the a PA has been started for her. Bystolic samples have been left at the front.   Medication Samples have been provided to the patient.  Drug name: Bystolic       Strength: 10 mg        Qty: 2 boxes  LOT: [id number]  Exp.Date: 08/2020    Prior-authorization complete by phone. Will take up to 4 days for answer.  Please provide samples if needed.   Thanks  [name]      Hello Dr. Sallyanne Kuster, My healthcare plan no longer covers Bystolic, which I have been taking for ~10 years.  This is disappointing because I've not had any problems with Bystolic.  Can you recommend an alternative medicine(s)? Thank you, Mandy Sellers DOB 1960-09-23

## 2019-07-28 NOTE — Telephone Encounter (Signed)
The prior authorization for Bystolic has been denied. Her insurance will not cover this medication. The patient has been given samples for 2 weeks of Bystolic.

## 2019-07-29 ENCOUNTER — Telehealth: Payer: Self-pay | Admitting: *Deleted

## 2019-07-29 MED ORDER — METOPROLOL SUCCINATE ER 50 MG PO TB24: 50.0000 mg | ORAL_TABLET | Freq: Every day | ORAL | 3 refills | Status: DC

## 2019-07-29 NOTE — Telephone Encounter (Signed)
Metoprolol succinate 50 mg daily, please

## 2019-07-29 NOTE — Telephone Encounter (Signed)
   [facility] Pre-operative Risk Assessment    Request for surgical clearance:  1. What type of surgery is being performed? L4-5 Lumbar Laminectomy   2. When is this surgery scheduled? TBD   3. What type of clearance is required (medical clearance vs. Pharmacy clearance to hold med vs. Both)? both  4. Are there any medications that need to be held prior to surgery and how long? ASA   5. Practice name and name of physician performing surgery?  [facility] Dr. Saintclair Halsted  6. What is your office phone number [phone]    7.   What is your office fax number [phone] attention: Lorriane Shire  8.   Anesthesia type (None, local, MAC, general) ?  general   Mandy Sellers Mandy Sellers 07/29/2019, 3:41 PM  _________________________________________________________________

## 2019-07-29 NOTE — Telephone Encounter (Signed)
Spoke to patient-aware of recommendations and verbalized understanding.    rx sent to pharmacy.

## 2019-07-29 NOTE — Addendum Note (Signed)
Addended by: Patria Mane A on: 07/29/2019 04:30 PM   Modules accepted: Orders

## 2019-08-01 NOTE — Telephone Encounter (Signed)
   Primary Cardiologist: Sanda Klein, MD  Chart reviewed as part of pre-operative protocol coverage. Patient was contacted 08/01/2019 in reference to pre-operative risk assessment for pending surgery as outlined below.  Mandy Sellers was last seen on 02/07/2019 by Dr. Sallyanne Kuster.  Since that day, Mandy Sellers has done well. She has no history of CAD or heart failure. She has a pacemaker for complete heart block. She is doing well with no cardiac complaints.  Therefore, based on ACC/AHA guidelines, the patient would be at acceptable risk for the planned procedure without further cardiovascular testing.   She is taking aspirin for prevention. Aspirin can be held as needed.   I will route this recommendation to the requesting party via Epic fax function and remove from pre-op pool.  Please call with questions.  Daune Perch, NP 08/01/2019, 12:41 PM

## 2019-08-02 ENCOUNTER — Other Ambulatory Visit: Payer: Self-pay

## 2019-08-05 DIAGNOSIS — Z9889 Other specified postprocedural states: Secondary | ICD-10-CM | POA: Insufficient documentation

## 2019-10-05 ENCOUNTER — Ambulatory Visit (INDEPENDENT_AMBULATORY_CARE_PROVIDER_SITE_OTHER): Payer: 59 | Admitting: *Deleted

## 2019-10-05 DIAGNOSIS — I442 Atrioventricular block, complete: Secondary | ICD-10-CM

## 2019-10-05 LAB — CUP PACEART REMOTE DEVICE CHECK
Battery Impedance: 1740 Ohm
Battery Remaining Longevity: 43 mo
Battery Voltage: 2.76 V
Brady Statistic AP VP Percent: 1 %
Brady Statistic AP VS Percent: 0 %
Brady Statistic AS VP Percent: 99 %
Brady Statistic AS VS Percent: 0 %
Date Time Interrogation Session: 20210331091113
Implantable Lead Implant Date: 20100924
Implantable Lead Implant Date: 20100924
Implantable Lead Location: 753859
Implantable Lead Location: 753860
Implantable Lead Model: 5076
Implantable Lead Model: 5076
Implantable Pulse Generator Implant Date: 20100924
Lead Channel Impedance Value: 502 Ohm
Lead Channel Impedance Value: 570 Ohm
Lead Channel Pacing Threshold Amplitude: 0.375 V
Lead Channel Pacing Threshold Amplitude: 0.75 V
Lead Channel Pacing Threshold Pulse Width: 0.4 ms
Lead Channel Pacing Threshold Pulse Width: 0.4 ms
Lead Channel Setting Pacing Amplitude: 1.5 V
Lead Channel Setting Pacing Amplitude: 2 V
Lead Channel Setting Pacing Pulse Width: 0.4 ms
Lead Channel Setting Sensing Sensitivity: 4 mV

## 2019-10-05 NOTE — Progress Notes (Signed)
ILR Remote 

## 2019-12-08 ENCOUNTER — Telehealth: Payer: Self-pay | Admitting: Cardiovascular Disease

## 2019-12-08 DIAGNOSIS — R0600 Dyspnea, unspecified: Secondary | ICD-10-CM

## 2019-12-08 NOTE — Addendum Note (Signed)
Addended by: Fidel Levy on: 12/08/2019 02:04 PM   Modules accepted: Orders

## 2019-12-08 NOTE — Telephone Encounter (Signed)
I would also go ahead and schedule echo for dyspnea.

## 2019-12-08 NOTE — Telephone Encounter (Signed)
Returned call to patient of Dr. Sallyanne Kuster who has 2 concerns: SOB, medication issue  Patient reports SOB for 2 months. This occurs with exertion. This is a change from her last visit with MD 02/2019 and she has noticed this more so since starting bystolic.   She had to change from bystolic to metoprolol succinate this year, due to insurance. She had previously taken bystolic for 10 years and tolerated it well with no side effects. She is concerned that metoprolol could be interacting with Effexor   She has not checked BP/HR at home recently. 160s/90s at last PCP visit a few weeks ago - MD did not adjust medications.   She reports weight gain of about 17lbs in the last few months, due to diet per report  She reports feeling ill all the time. Her body is in pain all the time. She reports weakness, fatigue, "everything is an effort".   She is asking if her cardiac function has been checked recently. Last echo was 2012 and stress test was 2010 from what I could tell in chart.   Will route to MD for SOB and PharmD for med issue

## 2019-12-08 NOTE — Telephone Encounter (Signed)
Spoke with patient.  Reviewed the potential drug interaction with metoprolol and venlafaxine.  Has potential to increase AUC by 30-40%.  Advised for now that she cut the metoprolol from 50 mg to 25 mg once daily.   She also notes that the SOB has been increasingly getting worse, but still is only exertional.  Also mentions a 17 pound weight gain since January.   Will have scheduling set up appointment for her to see PA in the next week or two to follow up.

## 2019-12-08 NOTE — Telephone Encounter (Signed)
Echo ordered per MD. Patient aware. Message sent to [facility] scheduling/[facility]

## 2019-12-08 NOTE — Telephone Encounter (Signed)
Pt c/o medication issue:  1. Name of Medication: metoprolol succinate (TOPROL-XL) 50 MG 24 hr tablet  2. How are you currently taking this medication (dosage and times per day)?  As directed  3. Are you having a reaction (difficulty breathing--STAT)? SOB see below  4. What is your medication issue? Exhaustion, fatigue, difficulty breathing at times, weakness, just doesn't feel well at times. Patient read that there may be interactions with venlafaxine XR (EFFEXOR-XR) 75 MG 24 hr capsule  The patient wants to know if there is another medication she can take in place of the metoprolol. The patient was taking bystolic for 10 years and never had this issue. She only changed mediations because her insurance will not cover it   Pt c/o Shortness Of Breath: STAT if SOB developed within the last 24 hours or pt is noticeably SOB on the phone  1. Are you currently SOB (can you hear that pt is SOB on the phone)? No. Pt is sitting down so she is not SOB  2. How long have you been experiencing SOB? Daily for the past two months  3. Are you SOB when sitting or when up moving around? When up and moving around, especially when going up a flight of stairs   4. Are you currently experiencing any other symptoms? See above

## 2019-12-09 ENCOUNTER — Ambulatory Visit (HOSPITAL_COMMUNITY): Payer: 59 | Attending: Cardiology

## 2019-12-09 ENCOUNTER — Other Ambulatory Visit: Payer: Self-pay

## 2019-12-09 DIAGNOSIS — R0600 Dyspnea, unspecified: Secondary | ICD-10-CM

## 2019-12-15 ENCOUNTER — Ambulatory Visit: Payer: 59 | Admitting: Physician Assistant

## 2019-12-15 ENCOUNTER — Other Ambulatory Visit: Payer: Self-pay

## 2019-12-15 VITALS — BP 125/86 | HR 90 | Ht 66.0 in | Wt 218.0 lb

## 2019-12-15 DIAGNOSIS — E119 Type 2 diabetes mellitus without complications: Secondary | ICD-10-CM

## 2019-12-15 DIAGNOSIS — R0600 Dyspnea, unspecified: Secondary | ICD-10-CM

## 2019-12-15 DIAGNOSIS — R0609 Other forms of dyspnea: Secondary | ICD-10-CM

## 2019-12-15 DIAGNOSIS — I10 Essential (primary) hypertension: Secondary | ICD-10-CM | POA: Diagnosis not present

## 2019-12-15 DIAGNOSIS — Z720 Tobacco use: Secondary | ICD-10-CM | POA: Diagnosis not present

## 2019-12-15 DIAGNOSIS — Z950 Presence of cardiac pacemaker: Secondary | ICD-10-CM

## 2019-12-15 NOTE — Patient Instructions (Signed)
Medication Instructions:  Your physician recommends that you continue on your current medications as directed. Please refer to the Current Medication list given to you today.  *If you need a refill on your cardiac medications before your next appointment, please call your pharmacy*  Lab Work: Your physician recommends that you return for lab work TODAY:   CBC  CMET  TSH If you have labs (blood work) drawn today and your tests are completely normal, you will receive your results only by: Marland Kitchen MyChart Message (if you have MyChart) OR . A paper copy in the mail If you have any lab test that is abnormal or we need to change your treatment, we will call you to review the results.  Testing/Procedures: You have been referred to Pulmonology    Follow-Up: At [facility], you and your health needs are our priority.  As part of our continuing mission to provide you with exceptional heart care, we have created designated Provider Care Teams.  These Care Teams include your primary Cardiologist (physician) and Advanced Practice Providers (APPs -  Physician Assistants and Nurse Practitioners) who all work together to provide you with the care you need, when you need it.  Your next appointment:   3 month(s)  The format for your next appointment:   In Person  Provider:   Sanda Klein, MD  Other Instructions

## 2019-12-15 NOTE — Progress Notes (Signed)
Cardiology Office Note:    Date:  12/18/2019   ID:  Mandy Sellers, DOB Jan 21, 1961, MRN [id number]  PCP:  Lavone Orn, MD  [facility] Cardiologist:  Sanda Klein, MD  [facility] Electrophysiologist:  None   Referring MD: Lavone Orn, MD   Chief Complaint  Patient presents with  . Follow-up    seen for Dr. Sallyanne Kuster    History of Present Illness:    Mandy Sellers is a 59 y.o. female with a hx of CHB s/p Medtronic dual chamber PPM, HTN, DM II, sciatica and tobacco abuse.  Patient was last seen by Dr. Sallyanne Kuster on 02/07/2019, her device was functioning normally at the time with battery life of 4 years.  Her Bystolic was switched to metoprolol succinate in January due to change in insurance.  She called cardiology service on 6/3 complaining of worsening dyspnea for the past 2 months.  Due to the concern of interaction between metoprolol and Effexor, metoprolol dose was reduced.  Outpatient echocardiogram was also ordered.  Patient presents today for cardiology follow-up.  Heart rate is in the 90s range upon arrival.  She says her shortness of breath has been increasing since earlier this year.  Along with her shortness of breath, she also complains of increasing fatigue with exertion and also diffuse body ache.  Recent echocardiogram obtained by Dr. Sallyanne Kuster showed normal ejection fraction without significant valve issue.  I recommend a CBC, complete blood metabolic panel and a TSH to look for secondary causes for her symptoms.  After her metoprolol was reduced to 25 mg daily, she says her fatigue has improved a little, however the dyspnea on exertion and the body ache has not improved.  I did recommend a earlier remote device transmission as she is due for remote interrogation at the end of this month anyway.  The remote transmission will help [place] rule out other possibilities that can cause shortness of breath and fatigue such as atrial flutter that may not be very obvious on EKG.   Otherwise, she has been smoking since she was a teenager, I do suspect she likely has some degree of pulmonary issue such as COPD.  I recommend that she see a pulmonologist as well and has made appropriate referral.  Past Medical History:  Diagnosis Date  . CHB (complete heart block) ([place]) 08/09/2013  . Depression   . HTN (hypertension) 08/09/2013  . Hypertension   . Obesity (BMI 30.0-34.9) 08/09/2013  . Pacemaker    dual-chmber for CHB in 2010  . Tobacco abuse     Past Surgical History:  Procedure Laterality Date  . DILATATION & CURRETTAGE/HYSTEROSCOPY WITH RESECTOCOPE N/A 11/10/2013   Procedure: DILATATION & CURETTAGE/HYSTEROSCOPY WITH MYOSURE;  Surgeon: Darlyn Chamber, MD;  Location: [facility];  Service: Gynecology;  Laterality: N/A;  . INCONTINENCE SURGERY  03/2000  . INSERT / REPLACE / REMOVE PACEMAKER    . NM MYOCAR PERF WALL MOTION  05/2009   bruce myoview; normal pattern of perfusion in all regions, low risk scan  . PACEMAKER INSERTION  03/30/2009   for CHB; Medtronic Adapta ADDRL1 - Dr. Norlene Duel  . TRANSTHORACIC ECHOCARDIOGRAM  09/2010   EF=>55%; mild MR; trace pulm valve regurg   . TUBAL LIGATION  03/2000    Current Medications: Current Meds  Medication Sig  . albuterol (VENTOLIN HFA) 108 (90 Base) MCG/ACT inhaler   . aspirin EC 81 MG tablet Take 81 mg by mouth at bedtime.  . Cholecalciferol (VITAMIN D-3) 1000 UNITS  CAPS Take 1,000 Units by mouth daily.  . clonazePAM (KLONOPIN) 1 MG tablet Take 1 mg by mouth at bedtime as needed.  . hydrochlorothiazide (MICROZIDE) 12.5 MG capsule Take 1 capsule (12.5 mg total) by mouth daily.  . metoprolol succinate (TOPROL-XL) 25 MG 24 hr tablet Take 25 mg by mouth daily.  . Multiple Vitamin (MULTIVITAMIN WITH MINERALS) TABS tablet Take 1 tablet by mouth daily.  . Multiple Vitamins-Minerals (AIRBORNE) CHEW Chew 1 tablet by mouth daily.  . Omega-3 Fatty Acids (FISH OIL) 1000 MG CAPS Take 2,000 mg by mouth daily.  Marland Kitchen venlafaxine XR (EFFEXOR-XR) 75  MG 24 hr capsule Take 75 mg by mouth every evening.     Allergies:   Chantix [varenicline]   Social History   Socioeconomic History  . Marital status: Widowed    Spouse name: Not on file  . Number of children: 1  . Years of education: master's  . Highest education level: Not on file  Occupational History    Employer: [employer]  Tobacco Use  . Smoking status: Heavy Tobacco Smoker    Packs/day: 1.00    Years: 30.00    Pack years: 30.00    Types: Cigarettes  . Smokeless tobacco: Never Used  Substance and Sexual Activity  . Alcohol use: Yes    Comment: social  . Drug use: No  . Sexual activity: Yes  Other Topics Concern  . Not on file  Social History Narrative  . Not on file   Social Determinants of Health   Financial Resource Strain:   . Difficulty of Paying Living Expenses:   Food Insecurity:   . Worried About Charity fundraiser in the Last Year:   . Arboriculturist in the Last Year:   Transportation Needs:   . Film/video editor (Medical):   Marland Kitchen Lack of Transportation (Non-Medical):   Physical Activity:   . Days of Exercise per Week:   . Minutes of Exercise per Session:   Stress:   . Feeling of Stress :   Social Connections:   . Frequency of Communication with Friends and Family:   . Frequency of Social Gatherings with Friends and Family:   . Attends Religious Services:   . Active Member of Clubs or Organizations:   . Attends Archivist Meetings:   Marland Kitchen Marital Status:      Family History: The patient's family history includes Alcohol abuse in her father; Deep vein thrombosis in her brother; Diabetes in her father and mother; Hypertension in her father; Stroke in her father.  ROS:   Please see the history of present illness.     All other systems reviewed and are negative.  EKGs/Labs/Other Studies Reviewed:    The following studies were reviewed today:  Echo 12/09/2019 1. Left ventricular ejection fraction, by estimation, is 60 to 65%.  The  left ventricle has normal function. The left ventricle has no regional  wall motion abnormalities. There is mild concentric left ventricular  hypertrophy. Left ventricular diastolic  function could not be evaluated.  2. Right ventricular systolic function is normal. The right ventricular  size is normal. There is normal pulmonary artery systolic pressure.  3. The mitral valve is normal in structure. No evidence of mitral valve  regurgitation. No evidence of mitral stenosis.  4. The aortic valve is normal in structure. Aortic valve regurgitation is  not visualized. No aortic stenosis is present.  5. The inferior vena cava is normal in size with greater  than 50%  respiratory variability, suggesting right atrial pressure of 3 mmHg.   EKG:  EKG is ordered today.  The ekg ordered today demonstrates atrial sensed, ventricularly paced rhythm  Recent Labs: 12/15/2019: ALT 36; BUN 13; Creatinine, Ser 0.56; Hemoglobin 15.6; Platelets 215; Potassium 4.3; Sodium 142; TSH 2.810  Recent Lipid Panel    Component Value Date/Time   CHOL  03/29/2009 0730    144        ATP III CLASSIFICATION:  <200     mg/dL   Desirable  200-239  mg/dL   Borderline High  >=240    mg/dL   High          TRIG 123 03/29/2009 0730   HDL 50 03/29/2009 0730   CHOLHDL 2.9 03/29/2009 0730   VLDL 25 03/29/2009 0730   LDLCALC  03/29/2009 0730    69        Total Cholesterol/HDL:CHD Risk Coronary Heart Disease Risk Table                     Men   Women  1/2 Average Risk   3.4   3.3  Average Risk       5.0   4.4  2 X Average Risk   9.6   7.1  3 X Average Risk  23.4   11.0        Use the calculated Patient Ratio above and the CHD Risk Table to determine the patient's CHD Risk.        ATP III CLASSIFICATION (LDL):  <100     mg/dL   Optimal  100-129  mg/dL   Near or Above                    Optimal  130-159  mg/dL   Borderline  160-189  mg/dL   High  >190     mg/dL   Very High    Physical Exam:    VS:   BP 125/86   Pulse 90   Ht 5\' 6"  (1.676 m)   Wt 218 lb (98.9 kg)   LMP 06/15/2013   SpO2 96%   BMI 35.19 kg/m     Wt Readings from Last 3 Encounters:  12/15/19 218 lb (98.9 kg)  05/24/19 200 lb (90.7 kg)  02/07/19 197 lb 9.6 oz (89.6 kg)     GEN:  Well nourished, well developed in no acute distress HEENT: Normal NECK: No JVD; No carotid bruits LYMPHATICS: No lymphadenopathy CARDIAC: RRR, no murmurs, rubs, gallops RESPIRATORY:  Clear to auscultation without rales, wheezing or rhonchi  ABDOMEN: Soft, non-tender, non-distended MUSCULOSKELETAL:  No edema; No deformity  SKIN: Warm and dry NEUROLOGIC:  Alert and oriented x 3 PSYCHIATRIC:  Normal affect   ASSESSMENT:    1. DOE (dyspnea on exertion)   2. Tobacco abuse   3. Pacemaker   4. Essential hypertension   5. Controlled type 2 diabetes mellitus without complication, without long-term current use of insulin (HCC)    PLAN:    In order of problems listed above:  1. Dyspnea on exertion: Obtain lab work to rule out other secondary causes.  Since that she is due for device interrogation near the end of this month, we will try to get a remote transmission earlier to rule out other causes for her dyspnea on exertion.  She has been smoking since she was a teenager, her dyspnea on exertion is likely related to COPD.  She has never  seen by a pulmonologist in the past, I recommended referral to pulmonology service  2. Tobacco abuse: Tobacco cessation strongly advised  3. History of pacemaker secondary to complete heart block: Will require earlier device interrogation  4. Hypertension: Blood pressure stable  5. DM2: Managed by primary care provider   Medication Adjustments/Labs and Tests Ordered: Current medicines are reviewed at length with the patient today.  Concerns regarding medicines are outlined above.  Orders Placed This Encounter  Procedures  . CBC  . Comprehensive metabolic panel  . TSH  . Ambulatory referral to  Pulmonology  . EKG 12-Lead   No orders of the defined types were placed in this encounter.   Patient Instructions  Medication Instructions:  Your physician recommends that you continue on your current medications as directed. Please refer to the Current Medication list given to you today.  *If you need a refill on your cardiac medications before your next appointment, please call your pharmacy*  Lab Work: Your physician recommends that you return for lab work TODAY:   CBC  CMET  TSH If you have labs (blood work) drawn today and your tests are completely normal, you will receive your results only by: Marland Kitchen MyChart Message (if you have MyChart) OR . A paper copy in the mail If you have any lab test that is abnormal or we need to change your treatment, we will call you to review the results.  Testing/Procedures: You have been referred to Pulmonology    Follow-Up: At [facility], you and your health needs are our priority.  As part of our continuing mission to provide you with exceptional heart care, we have created designated Provider Care Teams.  These Care Teams include your primary Cardiologist (physician) and Advanced Practice Providers (APPs -  Physician Assistants and Nurse Practitioners) who all work together to provide you with the care you need, when you need it.  Your next appointment:   3 month(s)  The format for your next appointment:   In Person  Provider:   Sanda Klein, MD  Other Instructions      Signed, Almyra Deforest, [place]  12/18/2019 12:09 AM    [place]

## 2019-12-16 LAB — COMPREHENSIVE METABOLIC PANEL
ALT: 36 IU/L — ABNORMAL HIGH (ref 0–32)
AST: 25 IU/L (ref 0–40)
Albumin/Globulin Ratio: 2.1 (ref 1.2–2.2)
Albumin: 4.5 g/dL (ref 3.8–4.8)
Alkaline Phosphatase: 135 IU/L — ABNORMAL HIGH (ref 48–121)
BUN/Creatinine Ratio: 23 (ref 12–28)
BUN: 13 mg/dL (ref 8–27)
Bilirubin Total: <0.2 mg/dL (ref 0.0–1.2)
CO2: 22 mmol/L (ref 20–29)
Calcium: 9.9 mg/dL (ref 8.7–10.3)
Chloride: 101 mmol/L (ref 96–106)
Creatinine, Ser: 0.56 mg/dL — ABNORMAL LOW (ref 0.57–1.00)
GFR calc Af Amer: 116 mL/min/{1.73_m2} (ref >59)
GFR calc non Af Amer: 101 mL/min/{1.73_m2} (ref >59)
Globulin, Total: 2.1 g/dL (ref 1.5–4.5)
Glucose: 123 mg/dL — ABNORMAL HIGH (ref 65–99)
Potassium: 4.3 mmol/L (ref 3.5–5.2)
Sodium: 142 mmol/L (ref 134–144)
Total Protein: 6.6 g/dL (ref 6.0–8.5)

## 2019-12-16 LAB — CBC
Hematocrit: 44.4 % (ref 34.0–46.6)
Hemoglobin: 15.6 g/dL (ref 11.1–15.9)
MCH: 31 pg (ref 26.6–33.0)
MCHC: 35.1 g/dL (ref 31.5–35.7)
MCV: 88 fL (ref 79–97)
Platelets: 215 10*3/uL (ref 150–450)
RBC: 5.04 x10E6/uL (ref 3.77–5.28)
RDW: 11.8 % (ref 11.7–15.4)
WBC: 8.6 10*3/uL (ref 3.4–10.8)

## 2019-12-16 LAB — TSH: TSH: 2.81 u[IU]/mL (ref 0.450–4.500)

## 2019-12-16 NOTE — Progress Notes (Signed)
Normal red blood cell count, stable renal function and electrolyte, stable thyroid level. Alkaline phosphatase borderline elevated, however this is a nonspecific lab. No secondary cause identified to explain her dyspnea

## 2019-12-18 ENCOUNTER — Encounter: Payer: Self-pay | Admitting: Physician Assistant

## 2019-12-22 ENCOUNTER — Encounter: Payer: Self-pay | Admitting: Physician Assistant

## 2019-12-22 NOTE — Telephone Encounter (Signed)
erorr

## 2020-01-04 ENCOUNTER — Ambulatory Visit (INDEPENDENT_AMBULATORY_CARE_PROVIDER_SITE_OTHER): Payer: 59 | Admitting: *Deleted

## 2020-01-04 DIAGNOSIS — I442 Atrioventricular block, complete: Secondary | ICD-10-CM

## 2020-01-06 LAB — CUP PACEART REMOTE DEVICE CHECK
Battery Impedance: 1812 Ohm
Battery Remaining Longevity: 39 mo
Battery Voltage: 2.76 V
Brady Statistic AP VP Percent: 1 %
Brady Statistic AP VS Percent: 0 %
Brady Statistic AS VP Percent: 99 %
Brady Statistic AS VS Percent: 0 %
Date Time Interrogation Session: 20210701142535
Implantable Lead Implant Date: 20100924
Implantable Lead Implant Date: 20100924
Implantable Lead Location: 753859
Implantable Lead Location: 753860
Implantable Lead Model: 5076
Implantable Lead Model: 5076
Implantable Pulse Generator Implant Date: 20100924
Lead Channel Impedance Value: 445 Ohm
Lead Channel Impedance Value: 464 Ohm
Lead Channel Pacing Threshold Amplitude: 0.375 V
Lead Channel Pacing Threshold Amplitude: 0.625 V
Lead Channel Pacing Threshold Pulse Width: 0.4 ms
Lead Channel Pacing Threshold Pulse Width: 0.4 ms
Lead Channel Setting Pacing Amplitude: 1.5 V
Lead Channel Setting Pacing Amplitude: 2 V
Lead Channel Setting Pacing Pulse Width: 0.4 ms
Lead Channel Setting Sensing Sensitivity: 4 mV

## 2020-01-06 NOTE — Progress Notes (Signed)
Remote pacemaker transmission.   

## 2020-01-10 ENCOUNTER — Other Ambulatory Visit: Payer: Self-pay | Admitting: Neurosurgery

## 2020-01-10 DIAGNOSIS — M5440 Lumbago with sciatica, unspecified side: Secondary | ICD-10-CM

## 2020-01-11 ENCOUNTER — Telehealth: Payer: Self-pay

## 2020-01-11 DIAGNOSIS — F17210 Nicotine dependence, cigarettes, uncomplicated: Secondary | ICD-10-CM | POA: Insufficient documentation

## 2020-01-11 DIAGNOSIS — K589 Irritable bowel syndrome without diarrhea: Secondary | ICD-10-CM | POA: Insufficient documentation

## 2020-01-11 DIAGNOSIS — J449 Chronic obstructive pulmonary disease, unspecified: Secondary | ICD-10-CM | POA: Insufficient documentation

## 2020-01-11 DIAGNOSIS — R519 Headache, unspecified: Secondary | ICD-10-CM | POA: Insufficient documentation

## 2020-01-11 DIAGNOSIS — F329 Major depressive disorder, single episode, unspecified: Secondary | ICD-10-CM | POA: Insufficient documentation

## 2020-01-11 DIAGNOSIS — K5790 Diverticulosis of intestine, part unspecified, without perforation or abscess without bleeding: Secondary | ICD-10-CM | POA: Insufficient documentation

## 2020-01-11 NOTE — Telephone Encounter (Signed)
Spoke with patient to review her medications before she is scheduled for a myelogram.  She was informed she will be here two hours, needs a driver and will need to be on strict bedrest for 24 hours after the myelogram.  She also was informed she will need to hold Venlafaxine for 48 hours before, and 24 hours after, the myelogram.

## 2020-01-19 ENCOUNTER — Ambulatory Visit
Admission: RE | Admit: 2020-01-19 | Discharge: 2020-01-19 | Disposition: A | Discharge status: Home / Self Care | Payer: 59 | Source: Ambulatory Visit | Attending: Neurosurgery | Admitting: Neurosurgery

## 2020-01-19 ENCOUNTER — Other Ambulatory Visit: Payer: Self-pay

## 2020-01-19 DIAGNOSIS — M5440 Lumbago with sciatica, unspecified side: Secondary | ICD-10-CM

## 2020-01-19 MED ORDER — MEPERIDINE HCL 100 MG/ML IJ SOLN
75.0000 mg | Freq: Once | INTRAMUSCULAR | Status: AC
  Administered 2020-01-19: 75 mg via INTRAMUSCULAR

## 2020-01-19 MED ORDER — IOPAMIDOL (ISOVUE-M 200) INJECTION 41%
18.0000 mL | Freq: Once | INTRAMUSCULAR | Status: AC
  Administered 2020-01-19: 18 mL via INTRATHECAL

## 2020-01-19 MED ORDER — ONDANSETRON HCL 4 MG/2ML IJ SOLN
4.0000 mg | Freq: Once | INTRAMUSCULAR | Status: AC
  Administered 2020-01-19: 4 mg via INTRAMUSCULAR

## 2020-01-19 MED ORDER — DIAZEPAM 5 MG PO TABS
10.0000 mg | ORAL_TABLET | Freq: Once | ORAL | Status: AC
  Administered 2020-01-19: 10 mg via ORAL

## 2020-01-19 NOTE — Progress Notes (Signed)
Patient states she has been off Venlafaxine for at least the past two days.

## 2020-01-19 NOTE — Discharge Instructions (Signed)
Myelogram Discharge Instructions  1. Go home and rest quietly for the next 24 hours.  It is important to lie flat for the next 24 hours.  Get up only to go to the restroom.  You may lie in the bed or on a couch on your back, your stomach, your left side or your right side.  You may have one pillow under your head.  You may have pillows between your knees while you are on your side or under your knees while you are on your back.  2. DO NOT drive today.  Recline the seat as far back as it will go, while still wearing your seat belt, on the way home.  3. You may get up to go to the bathroom as needed.  You may sit up for 15-20 minutes to eat.  You may resume your normal diet and medications unless otherwise indicated.  Drink plenty of extra fluids today and tomorrow.  4. The incidence of a spinal headache with nausea and/or vomiting is about 5% (one in 20 patients).  If you develop a headache, lie flat and drink plenty of fluids until the headache goes away.  Caffeinated beverages may be helpful.  If you develop severe nausea and vomiting or a headache that does not go away with flat bed rest, call [phone].  5. You may resume normal activities after your 24 hours of bed rest is over; however, do not exert yourself strongly or do any heavy lifting tomorrow.  6. Call your physician for a follow-up appointment.    You may resume Venlafaxine on Friday, January 20, 2020 after 9:30a.m.

## 2020-01-25 ENCOUNTER — Other Ambulatory Visit: Payer: Self-pay | Admitting: Student

## 2020-01-25 DIAGNOSIS — M5440 Lumbago with sciatica, unspecified side: Secondary | ICD-10-CM

## 2020-01-26 ENCOUNTER — Telehealth: Payer: Self-pay | Admitting: Pulmonary Disease

## 2020-01-26 ENCOUNTER — Ambulatory Visit: Payer: 59 | Admitting: Pulmonary Disease

## 2020-01-26 ENCOUNTER — Encounter: Payer: Self-pay | Admitting: Pulmonary Disease

## 2020-01-26 ENCOUNTER — Other Ambulatory Visit: Payer: Self-pay | Admitting: Pulmonary Disease

## 2020-01-26 ENCOUNTER — Ambulatory Visit (INDEPENDENT_AMBULATORY_CARE_PROVIDER_SITE_OTHER): Payer: 59

## 2020-01-26 ENCOUNTER — Other Ambulatory Visit: Payer: Self-pay

## 2020-01-26 VITALS — BP 126/66 | HR 93 | Ht 66.0 in | Wt 215.6 lb

## 2020-01-26 DIAGNOSIS — J439 Emphysema, unspecified: Secondary | ICD-10-CM

## 2020-01-26 DIAGNOSIS — R0602 Shortness of breath: Secondary | ICD-10-CM | POA: Diagnosis not present

## 2020-01-26 DIAGNOSIS — Z720 Tobacco use: Secondary | ICD-10-CM | POA: Diagnosis not present

## 2020-01-26 LAB — CBC WITH DIFFERENTIAL/PLATELET
Basophils Absolute: 0.1 10*3/uL (ref 0.0–0.1)
Basophils Relative: 0.6 % (ref 0.0–3.0)
Eosinophils Absolute: 0 10*3/uL (ref 0.0–0.7)
Eosinophils Relative: 0.1 % (ref 0.0–5.0)
HCT: 46.4 % — ABNORMAL HIGH (ref 36.0–46.0)
Hemoglobin: 16.2 g/dL — ABNORMAL HIGH (ref 12.0–15.0)
Lymphocytes Relative: 26 % (ref 12.0–46.0)
Lymphs Abs: 2.7 10*3/uL (ref 0.7–4.0)
MCHC: 34.8 g/dL (ref 30.0–36.0)
MCV: 89.1 fl (ref 78.0–100.0)
Monocytes Absolute: 0.9 10*3/uL (ref 0.1–1.0)
Monocytes Relative: 9.1 % (ref 3.0–12.0)
Neutro Abs: 6.7 10*3/uL (ref 1.4–7.7)
Neutrophils Relative %: 64.2 % (ref 43.0–77.0)
Platelets: 207 10*3/uL (ref 150.0–400.0)
RBC: 5.21 Mil/uL — ABNORMAL HIGH (ref 3.87–5.11)
RDW: 12.8 % (ref 11.5–15.5)
WBC: 10.4 10*3/uL (ref 4.0–10.5)

## 2020-01-26 MED ORDER — STIOLTO RESPIMAT 2.5-2.5 MCG/ACT IN AERS
2.0000 | INHALATION_SPRAY | Freq: Every day | RESPIRATORY_TRACT | 5 refills | Status: DC
Start: 2020-01-26 — End: 2020-01-26

## 2020-01-26 NOTE — Telephone Encounter (Signed)
Received refill request from pharmacy.  Stiolto was not covered by [employer].  Anoro was requested.  Anoro alternative verbally approved by Dr Andi Hence prescription approved.  Nothing further at this time.

## 2020-01-26 NOTE — Addendum Note (Signed)
Addended by: Suzzanne Cloud E on: 01/26/2020 12:45 PM   Modules accepted: Orders

## 2020-01-26 NOTE — Patient Instructions (Signed)
We will start you on a medication called Stiolto Check labs including CBC differential, IgE, alpha-1 antitrypsin levels and phenotype Chest x-ray and PFTs We will also refer you for low-dose screening CT of the chest Continue to work on smoking cessation  Follow-up in 2 to 3 months.

## 2020-01-26 NOTE — Addendum Note (Signed)
Addended by: Elton Sin on: 01/26/2020 12:37 PM   Modules accepted: Orders

## 2020-01-26 NOTE — Progress Notes (Signed)
Mandy Sellers    [id number]    [birth year]  Primary Care Physician:Griffin, Jenny Reichmann, MD  Referring Physician: Almyra Deforest, [address] [place],  [place] [id number]  Chief complaint: Consult for dyspnea  HPI: 59 year old smoker with history of hypertension, complete heart block status post pacemaker, diabetes Referred for evaluation of dyspnea on exertion  Complains of difficulty breathing, wheezing on exertion to the point where she becomes dizzy.  She cannot climb stairs or go up an incline without having to stop several times.  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  No mold, hot tub, Jacuzzi Smoking history: 60-pack-year smoker.  Continues to smoke 1.5 packs/day Travel history: Originally from [place].  No significant recent travel Relevant family history: No significant family history of lung disease   Outpatient Encounter Medications as of 01/26/2020  Medication Sig  . albuterol (VENTOLIN HFA) 108 (90 Base) MCG/ACT inhaler   . aspirin EC 81 MG tablet Take 81 mg by mouth at bedtime.  . Cholecalciferol (VITAMIN D-3) 1000 UNITS CAPS Take 1,000 Units by mouth daily.  . clonazePAM (KLONOPIN) 1 MG tablet Take 1 mg by mouth at bedtime as needed.  . hydrochlorothiazide (MICROZIDE) 12.5 MG capsule Take 1 capsule (12.5 mg total) by mouth daily.  . metoprolol succinate (TOPROL-XL) 25 MG 24 hr tablet Take 25 mg by mouth daily.  . Multiple Vitamin (MULTIVITAMIN WITH MINERALS) TABS tablet Take 1 tablet by mouth daily.  . Omega-3 Fatty Acids (FISH OIL) 1000 MG CAPS Take 2,000 mg by mouth daily.  Marland Kitchen venlafaxine XR (EFFEXOR-XR) 75 MG 24 hr capsule Take 75 mg by mouth every evening.   No facility-administered encounter medications on file as of 01/26/2020.    Allergies as of 01/26/2020  . (No Known Allergies)    Past  Medical History:  Diagnosis Date  . CHB (complete heart block) ([place]) 08/09/2013  . Depression   . HTN (hypertension) 08/09/2013  . Hypertension   . Obesity (BMI 30.0-34.9) 08/09/2013  . Pacemaker    dual-chmber for CHB in 2010  . Tobacco abuse     Past Surgical History:  Procedure Laterality Date  . DILATATION & CURRETTAGE/HYSTEROSCOPY WITH RESECTOCOPE N/A 11/10/2013   Procedure: DILATATION & CURETTAGE/HYSTEROSCOPY WITH MYOSURE;  Surgeon: Darlyn Chamber, MD;  Location: [facility];  Service: Gynecology;  Laterality: N/A;  . INCONTINENCE SURGERY  03/2000  . INSERT / REPLACE / REMOVE PACEMAKER    . NM MYOCAR PERF WALL MOTION  05/2009   bruce myoview; normal pattern of perfusion in all regions, low risk scan  . PACEMAKER INSERTION  03/30/2009   for CHB; Medtronic Adapta ADDRL1 - Dr. Norlene Duel  . TRANSTHORACIC ECHOCARDIOGRAM  09/2010   EF=>55%; mild MR; trace pulm valve regurg   . TUBAL LIGATION  03/2000    Family History  Problem Relation Age of Onset  . Diabetes Mother   . Diabetes Father   . Alcohol abuse Father   . Hypertension Father   . Stroke Father   . Deep vein thrombosis Brother     Social History   Socioeconomic History  . Marital status: Widowed    Spouse name: Not on file  . Number of children: 1  . Years of education: master's  . Highest education level: Not on file  Occupational History    Employer:  [employer]  Tobacco Use  . Smoking status: Heavy Tobacco Smoker    Packs/day: 1.00    Years: 30.00    Pack years: 30.00    Types: Cigarettes  . Smokeless tobacco: Never Used  Substance and Sexual Activity  . Alcohol use: Yes    Comment: social  . Drug use: No  . Sexual activity: Yes  Other Topics Concern  . Not on file  Social History Narrative  . Not on file   Social Determinants of Health   Financial Resource Strain:   . Difficulty of Paying Living Expenses:   Food Insecurity:   . Worried About Charity fundraiser in the Last Year:   . Arboriculturist  in the Last Year:   Transportation Needs:   . Film/video editor (Medical):   Marland Kitchen Lack of Transportation (Non-Medical):   Physical Activity:   . Days of Exercise per Week:   . Minutes of Exercise per Session:   Stress:   . Feeling of Stress :   Social Connections:   . Frequency of Communication with Friends and Family:   . Frequency of Social Gatherings with Friends and Family:   . Attends Religious Services:   . Active Member of Clubs or Organizations:   . Attends Archivist Meetings:   Marland Kitchen Marital Status:   Intimate Partner Violence:   . Fear of Current or Ex-Partner:   . Emotionally Abused:   Marland Kitchen Physically Abused:   . Sexually Abused:     Review of systems: Review of Systems  Constitutional: Negative for fever and chills.  HENT: Negative.   Eyes: Negative for blurred vision.  Respiratory: as per HPI  Cardiovascular: Negative for chest pain and palpitations.  Gastrointestinal: Negative for vomiting, diarrhea, blood per rectum. Genitourinary: Negative for dysuria, urgency, frequency and hematuria.  Musculoskeletal: Negative for myalgias, back pain and joint pain.  Skin: Negative for itching and rash.  Neurological: Negative for dizziness, tremors, focal weakness, seizures and loss of consciousness.  Endo/Heme/Allergies: Negative for environmental allergies.  Psychiatric/Behavioral: Negative for depression, suicidal ideas and hallucinations.  All other systems reviewed and are negative.  Physical Exam: Blood pressure 126/66, pulse 93, height 5\' 6"  (1.676 m), weight (!) 215 lb 9.6 oz (97.8 kg), last menstrual period 06/15/2013, SpO2 94 %. Gen:      No acute distress HEENT:  EOMI, sclera anicteric Neck:     No masses; no thyromegaly Lungs:    Clear to auscultation bilaterally; normal respiratory effort CV:         Regular rate and rhythm; no murmurs Abd:      + bowel sounds; soft, non-tender; no palpable masses, no distension Ext:    No edema; adequate peripheral  perfusion Skin:      Warm and dry; no rash Neuro: alert and oriented x 3 Psych: normal mood and affect  Data Reviewed: Imaging: Screening CT chest 05/26/2018-mild emphysema, biapical scarring.  Stable right upper lobe 3 mm nodule Chest x-ray 05/24/2019-no acute cardiopulmonary abnormality I have reviewed the images personally  PFTs:  Labs: CBC 05/27/2019-WBC 12.6, eos 0%  Assessment:  Emphysema Likely has COPD based on smoking history and symptoms We will start her on Stiolto inhaler.  No peripheral eosinophils in the past hence does not need ICS Schedule pulmonary function test and chest x-ray Check CBC, IgE and alpha-1 antitrypsin for baseline evaluation  Active smoker Discussed smoking cessation with patient.  She had tried Chantix and nicotine in the past  and is not interested in the medication Time spent counseling-5 minutes.  Reassess at return visit  She has not followed up with screening CTs of the chest since 2019.  Will resume the scans.  Plan/Recommendations: Stiolto CBC, IgE, alpha-1 antitrypsin Chest x-ray, PFTs Smoking cessation Resume low-dose screening CT of chest.  Marshell Garfinkel MD [facility] 01/26/2020, 12:12 PM  CC: Almyra Deforest, PA

## 2020-02-01 ENCOUNTER — Ambulatory Visit
Admission: RE | Admit: 2020-02-01 | Discharge: 2020-02-01 | Disposition: A | Discharge status: Home / Self Care | Payer: 59 | Source: Ambulatory Visit | Attending: Student | Admitting: Student

## 2020-02-01 ENCOUNTER — Other Ambulatory Visit: Payer: Self-pay

## 2020-02-01 DIAGNOSIS — M5440 Lumbago with sciatica, unspecified side: Secondary | ICD-10-CM

## 2020-02-01 MED ORDER — METHYLPREDNISOLONE ACETATE 40 MG/ML INJ SUSP (RADIOLOG
120.0000 mg | Freq: Once | INTRAMUSCULAR | Status: AC
  Administered 2020-02-01: 120 mg via EPIDURAL

## 2020-02-01 MED ORDER — IOPAMIDOL (ISOVUE-M 200) INJECTION 41%
1.0000 mL | Freq: Once | INTRAMUSCULAR | Status: AC
  Administered 2020-02-01: 1 mL via EPIDURAL

## 2020-02-01 NOTE — Discharge Instructions (Signed)

## 2020-02-02 ENCOUNTER — Telehealth: Payer: Self-pay | Admitting: *Deleted

## 2020-02-02 DIAGNOSIS — F17210 Nicotine dependence, cigarettes, uncomplicated: Secondary | ICD-10-CM

## 2020-02-06 LAB — IGE: IgE (Immunoglobulin E), Serum: 32 kU/L (ref <=114)

## 2020-02-06 LAB — ALPHA-1 ANTITRYPSIN PHENOTYPE: A-1 Antitrypsin, Ser: 165 mg/dL (ref 83–199)

## 2020-02-06 NOTE — Telephone Encounter (Signed)
LMTC x 1  

## 2020-02-06 NOTE — Telephone Encounter (Signed)
Spoke with pt and schedule SDMV 02/22/20 2:00  CT ordered Nothing further needed

## 2020-02-07 ENCOUNTER — Other Ambulatory Visit: Payer: Self-pay | Admitting: Cardiovascular Disease

## 2020-02-22 ENCOUNTER — Ambulatory Visit (INDEPENDENT_AMBULATORY_CARE_PROVIDER_SITE_OTHER): Payer: 59 | Admitting: Acute Care

## 2020-02-22 ENCOUNTER — Other Ambulatory Visit: Payer: Self-pay

## 2020-02-22 ENCOUNTER — Ambulatory Visit
Admission: RE | Admit: 2020-02-22 | Discharge: 2020-02-22 | Disposition: A | Discharge status: Home / Self Care | Payer: 59 | Source: Ambulatory Visit | Attending: Acute Care | Admitting: Acute Care

## 2020-02-22 ENCOUNTER — Other Ambulatory Visit: Payer: Self-pay | Admitting: Student

## 2020-02-22 ENCOUNTER — Encounter: Payer: Self-pay | Admitting: Acute Care

## 2020-02-22 VITALS — BP 122/84 | HR 97 | Temp 96.7°F | Ht 66.5 in | Wt 218.6 lb

## 2020-02-22 DIAGNOSIS — F17210 Nicotine dependence, cigarettes, uncomplicated: Secondary | ICD-10-CM

## 2020-02-22 DIAGNOSIS — M5440 Lumbago with sciatica, unspecified side: Secondary | ICD-10-CM

## 2020-02-22 DIAGNOSIS — Z122 Encounter for screening for malignant neoplasm of respiratory organs: Secondary | ICD-10-CM

## 2020-02-22 NOTE — Patient Instructions (Signed)
Thank you for participating in the [facility]. It was our pleasure to meet you today. We will call you with the results of your scan within the next few days. Your scan will be assigned a Lung RADS category score by the physicians reading the scans.  This Lung RADS score determines follow up scanning.  See below for description of categories, and follow up screening recommendations. We will be in touch to schedule your follow up screening annually or based on recommendations of our providers. We will fax a copy of your scan results to your Primary Care Physician, or the physician who referred you to the program, to ensure they have the results. Please call the office if you have any questions or concerns regarding your scanning experience or results.  Our office number is [phone]. Please speak with Denise Phelps, RN. She is our Lung Cancer Screening RN. If she is unavailable when you call, please have the office staff send her a message. She will return your call at her earliest convenience. Remember, if your scan is normal, we will scan you annually as long as you continue to meet the criteria for the program. (Age 55-77, Current smoker or smoker who has quit within the last 15 years). If you are a smoker, remember, quitting is the single most powerful action that you can take to decrease your risk of lung cancer and other pulmonary, breathing related problems. We know quitting is hard, and we are here to help.  Please let us know if there is anything we can do to help you meet your goal of quitting. If you are a former smoker, congratulations. We are proud of you! Remain smoke free! Remember you can refer friends or family members through the number above.  We will screen them to make sure they meet criteria for the program. Thank you for helping us take better care of you by participating in Lung Screening.  Lung RADS Categories:  Lung RADS 1: no nodules  or definitely non-concerning nodules.  Recommendation is for a repeat annual scan in 12 months.  Lung RADS 2:  nodules that are non-concerning in appearance and behavior with a very low likelihood of becoming an active cancer. Recommendation is for a repeat annual scan in 12 months.  Lung RADS 3: nodules that are probably non-concerning , includes nodules with a low likelihood of becoming an active cancer.  Recommendation is for a 6-month repeat screening scan. Often noted after an upper respiratory illness. We will be in touch to make sure you have no questions, and to schedule your 6-month scan.  Lung RADS 4 A: nodules with concerning findings, recommendation is most often for a follow up scan in 3 months or additional testing based on our provider's assessment of the scan. We will be in touch to make sure you have no questions and to schedule the recommended 3 month follow up scan.  Lung RADS 4 B:  indicates findings that are concerning. We will be in touch with you to schedule additional diagnostic testing based on our provider's  assessment of the scan.   

## 2020-02-22 NOTE — Progress Notes (Signed)
Shared Decision Making Visit Lung Cancer Screening Program [phone])   Eligibility:  Age 59 y.o.  Pack Years Smoking History Calculation 66 pack year smoking history (# packs/per year x # years smoked)  Recent History of coughing up blood  no  Unexplained weight loss? no ( >Than 15 pounds within the last 6 months )  Prior History Lung / other cancer no (Diagnosis within the last 5 years already requiring surveillance chest CT Scans).  Smoking Status Current Smoker  Former Smokers: Years since quit: NA  Quit Date: NA  Visit Components:  Discussion included one or more decision making aids. yes  Discussion included risk/benefits of screening. yes  Discussion included potential follow up diagnostic testing for abnormal scans. yes  Discussion included meaning and risk of over diagnosis. yes  Discussion included meaning and risk of False Positives. yes  Discussion included meaning of total radiation exposure. yes  Counseling Included:  Importance of adherence to annual lung cancer LDCT screening. yes  Impact of comorbidities on ability to participate in the program. yes  Ability and willingness to under diagnostic treatment. yes  Smoking Cessation Counseling:  Current Smokers:   Discussed importance of smoking cessation. yes  Information about tobacco cessation classes and interventions provided to patient. yes  Patient provided with "ticket" for LDCT Scan. yes  Symptomatic Patient. no  Counseling  Diagnosis Code: Tobacco Use Z72.0  Asymptomatic Patient yes  Counseling (Intermediate counseling: > three minutes counseling) H8469  Former Smokers:   Discussed the importance of maintaining cigarette abstinence. yes  Diagnosis Code: Personal History of Nicotine Dependence. G29.528  Information about tobacco cessation classes and interventions provided to patient. Yes  Patient provided with "ticket" for LDCT Scan. yes  Written Order for Lung Cancer  Screening with LDCT placed in Epic. Yes (CT Chest Lung Cancer Screening Low Dose W/O CM) [id number] Z12.2-Screening of respiratory organs Z87.891-Personal history of nicotine dependence  Pt. Is not interested in smoking cessation counseling with pharmacy. She states she does not have any desire to quit   I have spent 25 minutes of face to face time with  Mandy Sellers discussing the risks and benefits of lung cancer screening. We viewed a power point together that explained in detail the above noted topics. We paused at intervals to allow for questions to be asked and answered to ensure understanding.We discussed that the single most powerful action that she can take to decrease her risk of developing lung cancer is to quit smoking. We discussed whether or not she is ready to commit to setting a quit date. We discussed options for tools to aid in quitting smoking including nicotine replacement therapy, non-nicotine medications, support groups, Quit Smart classes, and behavior modification. We discussed that often times setting smaller, more achievable goals, such as eliminating 1 cigarette a day for a week and then 2 cigarettes a day for a week can be helpful in slowly decreasing the number of cigarettes smoked. This allows for a sense of accomplishment as well as providing a clinical benefit. I gave her the " Be Stronger Than Your Excuses" card with contact information for community resources, classes, free nicotine replacement therapy, and access to mobile apps, text messaging, and on-line smoking cessation help. I have also given her my card and contact information in the event she needs to contact me. We discussed the time and location of the scan, and that either Doroteo Glassman RN or I will call with the results within 24-48 hours of receiving them. I  have offered her  a copy of the power point we viewed  as a resource in the event they need reinforcement of the concepts we discussed today in the office. The  patient verbalized understanding of all of  the above and had no further questions upon leaving the office. They have my contact information in the event they have any further questions.  I spent 3 minutes counseling on smoking cessation and the health risks of continued tobacco abuse.  I explained to the patient that there has been a high incidence of coronary artery disease noted on these exams. I explained that this is a non-gated exam therefore degree or severity cannot be determined. This patient is not on statin therapy. I have asked the patient to follow-up with their PCP regarding any incidental finding of coronary artery disease and management with diet or medication as their PCP  feels is clinically indicated. The patient verbalized understanding of the above and had no further questions upon completion of the visit.      Magdalen Spatz, NP 02/22/2020

## 2020-02-23 NOTE — Progress Notes (Signed)
Please call patient and let them  know their  low dose Ct was read as a Lung RADS 2: nodules that are benign in appearance and behavior with a very low likelihood of becoming a clinically active cancer due to size or lack of growth. Recommendation per radiology is for a repeat LDCT in 12 months. Please let them  know we will order and schedule their  annual screening scan for 02/2021. Please let them  know there was notation of CAD on their  scan.  Please remind the patient  that this is a non-gated exam therefore degree or severity of disease  cannot be determined. Please have them  follow up with their PCP regarding potential risk factor modification, dietary therapy or pharmacologic therapy if clinically indicated. Pt.  is not  currently on statin therapy. Please place order for annual  screening scan for  02/2021 and fax results to PCP. Thanks so much.

## 2020-02-27 ENCOUNTER — Other Ambulatory Visit: Payer: Self-pay | Admitting: *Deleted

## 2020-02-27 DIAGNOSIS — F17210 Nicotine dependence, cigarettes, uncomplicated: Secondary | ICD-10-CM

## 2020-02-29 ENCOUNTER — Ambulatory Visit
Admission: RE | Admit: 2020-02-29 | Discharge: 2020-02-29 | Disposition: A | Discharge status: Home / Self Care | Payer: 59 | Source: Ambulatory Visit | Attending: Student | Admitting: Student

## 2020-02-29 ENCOUNTER — Other Ambulatory Visit: Payer: Self-pay

## 2020-02-29 DIAGNOSIS — M5440 Lumbago with sciatica, unspecified side: Secondary | ICD-10-CM

## 2020-02-29 MED ORDER — METHYLPREDNISOLONE ACETATE 40 MG/ML INJ SUSP (RADIOLOG
120.0000 mg | Freq: Once | INTRAMUSCULAR | Status: AC
  Administered 2020-02-29: 120 mg via EPIDURAL

## 2020-02-29 MED ORDER — IOPAMIDOL (ISOVUE-M 200) INJECTION 41%
1.0000 mL | Freq: Once | INTRAMUSCULAR | Status: AC
  Administered 2020-02-29: 1 mL via EPIDURAL

## 2020-02-29 NOTE — Discharge Instructions (Signed)

## 2020-03-19 IMAGING — XA Imaging study
2 series · 2 of 2 positions shown · non-contrast
Comparison: none

CLINICAL DATA: Lumbosacral spondylosis without myelopathy. Lumbar
myelography and postmyelogram CT demonstrates severe RIGHT lateral
recess stenosis at L4-5 with a RIGHT-sided disc protrusion.

[Series 1: ortho standard · 1 of 1 slices shown (1 of 2)]
[im 1/1]
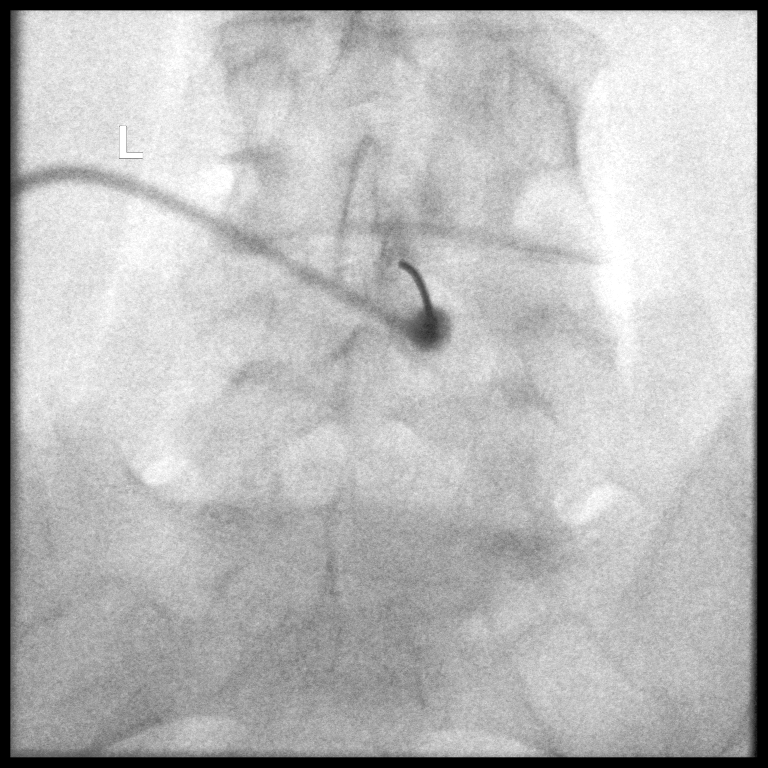

[Series 2: ortho standard · 1 of 1 slices shown (2 of 2)]
[im 1/1]
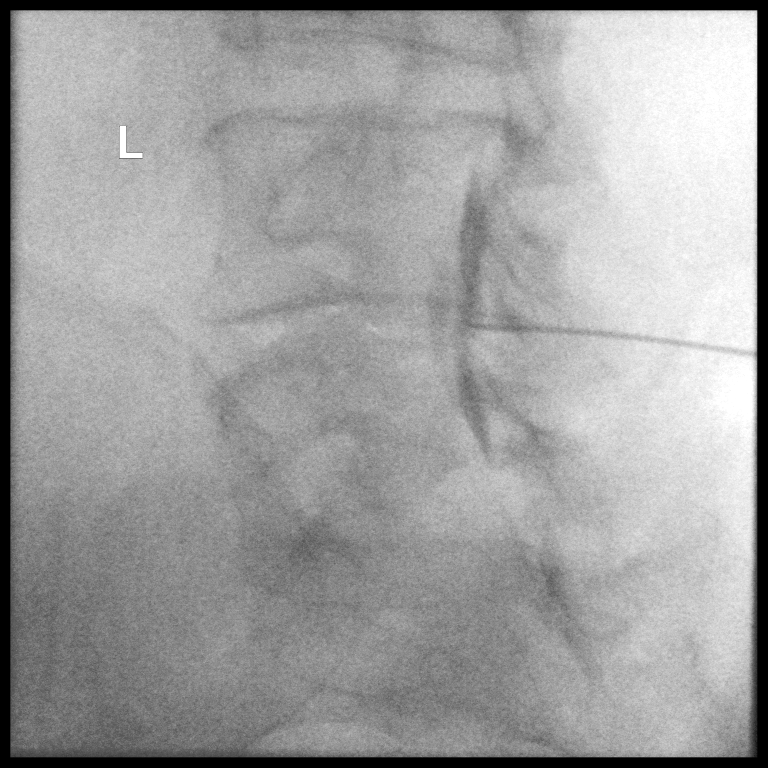

[2 of 2 positions shown; findings below may reference images not displayed]

FLUOROSCOPY TIME:  12 seconds corresponding to a Dose Area Product
of 20.06 Gy*m2

PROCEDURE:
The procedure, risks, benefits, and alternatives were explained to
the patient. Questions regarding the procedure were encouraged and
answered. The patient understands and consents to the procedure.

LUMBAR EPIDURAL INJECTION:

An interlaminar approach was performed on RIGHT at L4-5. The
overlying skin was cleansed and anesthetized. A 20 gauge epidural
needle was advanced using loss-of-resistance technique.

DIAGNOSTIC EPIDURAL INJECTION:

Injection of Isovue-M 200 shows a good epidural pattern with spread
above and below the level of needle placement, primarily on the
RIGHT; no vascular opacification is seen.

THERAPEUTIC EPIDURAL INJECTION:

120.0 mg of Depo-Medrol mixed with 2 mL 0.5% Sensorcaine were
instilled. The procedure was well-tolerated, and the patient was
discharged thirty minutes following the injection in good condition.

COMPLICATIONS:
None
IMPRESSION: Technically successful epidural injection on the RIGHT L4-5 # 1.

## 2020-03-21 ENCOUNTER — Other Ambulatory Visit: Payer: Self-pay | Admitting: Student

## 2020-03-21 DIAGNOSIS — M5416 Radiculopathy, lumbar region: Secondary | ICD-10-CM

## 2020-03-26 ENCOUNTER — Ambulatory Visit (INDEPENDENT_AMBULATORY_CARE_PROVIDER_SITE_OTHER): Payer: 59 | Admitting: Cardiovascular Disease

## 2020-03-26 ENCOUNTER — Other Ambulatory Visit: Payer: Self-pay

## 2020-03-26 ENCOUNTER — Encounter: Payer: Self-pay | Admitting: Cardiovascular Disease

## 2020-03-26 VITALS — BP 132/68 | HR 95 | Ht 66.0 in | Wt 219.0 lb

## 2020-03-26 DIAGNOSIS — I10 Essential (primary) hypertension: Secondary | ICD-10-CM

## 2020-03-26 DIAGNOSIS — Z950 Presence of cardiac pacemaker: Secondary | ICD-10-CM | POA: Diagnosis not present

## 2020-03-26 DIAGNOSIS — E669 Obesity, unspecified: Secondary | ICD-10-CM

## 2020-03-26 DIAGNOSIS — E1169 Type 2 diabetes mellitus with other specified complication: Secondary | ICD-10-CM

## 2020-03-26 DIAGNOSIS — F17200 Nicotine dependence, unspecified, uncomplicated: Secondary | ICD-10-CM

## 2020-03-26 DIAGNOSIS — I442 Atrioventricular block, complete: Secondary | ICD-10-CM | POA: Diagnosis not present

## 2020-03-26 DIAGNOSIS — E782 Mixed hyperlipidemia: Secondary | ICD-10-CM

## 2020-03-26 DIAGNOSIS — Z6835 Body mass index (BMI) 35.0-35.9, adult: Secondary | ICD-10-CM

## 2020-03-26 NOTE — Patient Instructions (Signed)

## 2020-03-26 NOTE — Progress Notes (Signed)
Patient ID: Mandy Sellers, female   DOB: Jun 07, 1961, 59 y.o.   MRN: [id number]    Cardiology Office Note    Date:  03/27/2020   ID:  Mandy Sellers, DOB [birth year], MRN [id number]  PCP:  Lavone Orn, MD  Cardiologist:   Sanda Klein, MD   Chief Complaint  Patient presents with  . Pacemaker Check    History of Present Illness:  Mandy Sellers is a 59 y.o. female with complete heart block here for a pacemaker check (she does have a very slow ventricular escape rhythm at about 35 bpm, but feels poorly when she has ventricular pacing threshold checks).  The patient specifically denies any chest pain at rest exertion, dyspnea at rest or with exertion, orthopnea, paroxysmal nocturnal dyspnea, syncope, palpitations, focal neurological deficits, intermittent claudication, lower extremity edema, unexplained weight gain, cough, hemoptysis or wheezing.  Unfortunately still smokes. Most recent lipid panel was good except for elevated triglycerides, although glycemic control had improved (A1c 6.0%).  Pacemaker interrogation shows normal device function with an estimated generator longevity of 3 years, 0.8% A paced, 99.8%ventricular paced rhythm with normal heart rate histograms and no recorded episodes of atrial mode switch or high ventricular rates. Lead parameters are excellent and unchanged from historical values.  Her leads are Medtronic [id number] leads implanted in 2007, which are MRI conditional.  The generator is a Medtronic Adapta (not MRI conditional).  Past Medical History:  Diagnosis Date  . CHB (complete heart block) ([place]) 08/09/2013  . Depression   . HTN (hypertension) 08/09/2013  . Hypertension   . Obesity (BMI 30.0-34.9) 08/09/2013  . Pacemaker    dual-chmber for CHB in 2010  . Tobacco abuse     Past Surgical History:  Procedure Laterality Date  . DILATATION & CURRETTAGE/HYSTEROSCOPY WITH RESECTOCOPE N/A 11/10/2013   Procedure: DILATATION & CURETTAGE/HYSTEROSCOPY WITH MYOSURE;   Surgeon: Darlyn Chamber, MD;  Location: [facility];  Service: Gynecology;  Laterality: N/A;  . INCONTINENCE SURGERY  03/2000  . INSERT / REPLACE / REMOVE PACEMAKER    . NM MYOCAR PERF WALL MOTION  05/2009   bruce myoview; normal pattern of perfusion in all regions, low risk scan  . PACEMAKER INSERTION  03/30/2009   for CHB; Medtronic Adapta ADDRL1 - Dr. Norlene Duel  . TRANSTHORACIC ECHOCARDIOGRAM  09/2010   EF=>55%; mild MR; trace pulm valve regurg   . TUBAL LIGATION  03/2000    Outpatient Medications Prior to Visit  Medication Sig Dispense Refill  . albuterol (VENTOLIN HFA) 108 (90 Base) MCG/ACT inhaler     . aspirin EC 81 MG tablet Take 81 mg by mouth at bedtime.    . Cholecalciferol (VITAMIN D-3) 1000 UNITS CAPS Take 1,000 Units by mouth daily.    . clonazePAM (KLONOPIN) 1 MG tablet Take 1 mg by mouth at bedtime as needed.    . hydrochlorothiazide (MICROZIDE) 12.5 MG capsule TAKE 1 CAPSULE BY MOUTH  DAILY 90 capsule 3  . metoprolol succinate (TOPROL-XL) 25 MG 24 hr tablet Take 25 mg by mouth daily.    . Multiple Vitamin (MULTIVITAMIN WITH MINERALS) TABS tablet Take 1 tablet by mouth daily.    . Omega-3 Fatty Acids (FISH OIL) 1000 MG CAPS Take 2,000 mg by mouth daily.    Marland Kitchen umeclidinium-vilanterol (ANORO ELLIPTA) 62.5-25 MCG/INH AEPB Inhale 1 puff into the lungs daily. 60 each 2  . venlafaxine XR (EFFEXOR-XR) 75 MG 24 hr capsule Take 75 mg by mouth every evening.     No  facility-administered medications prior to visit.     Allergies:   Patient has no known allergies.   Social History   Socioeconomic History  . Marital status: Widowed    Spouse name: Not on file  . Number of children: 1  . Years of education: master's  . Highest education level: Not on file  Occupational History    Employer: [employer]  Tobacco Use  . Smoking status: Heavy Tobacco Smoker    Packs/day: 1.50    Years: 44.00    Pack years: 66.00    Types: Cigarettes  . Smokeless tobacco: Never Used  Substance  and Sexual Activity  . Alcohol use: Yes    Comment: social  . Drug use: No  . Sexual activity: Yes  Other Topics Concern  . Not on file  Social History Narrative  . Not on file   Social Determinants of Health   Financial Resource Strain:   . Difficulty of Paying Living Expenses: Not on file  Food Insecurity:   . Worried About Charity fundraiser in the Last Year: Not on file  . Ran Out of Food in the Last Year: Not on file  Transportation Needs:   . Lack of Transportation (Medical): Not on file  . Lack of Transportation (Non-Medical): Not on file  Physical Activity:   . Days of Exercise per Week: Not on file  . Minutes of Exercise per Session: Not on file  Stress:   . Feeling of Stress : Not on file  Social Connections:   . Frequency of Communication with Friends and Family: Not on file  . Frequency of Social Gatherings with Friends and Family: Not on file  . Attends Religious Services: Not on file  . Active Member of Clubs or Organizations: Not on file  . Attends Archivist Meetings: Not on file  . Marital Status: Not on file     Family History:  The patient's family history includes Alcohol abuse in her father; Deep vein thrombosis in her brother; Diabetes in her father and mother; Hypertension in her father; Stroke in her father.   ROS:   Please see the history of present illness.    ROS  All other systems are reviewed and are negative.Marland Kitchen  PHYSICAL EXAM:   VS:  BP 132/68   Pulse 95   Ht 5\' 6"  (1.676 m)   Wt 219 lb (99.3 kg)   LMP 06/15/2013   SpO2 96%   BMI 35.35 kg/m      General: Alert, oriented x3, no distress, moderately obese Head: no evidence of trauma, PERRL, EOMI, no exophtalmos or lid lag, no myxedema, no xanthelasma; normal ears, nose and oropharynx Neck: normal jugular venous pulsations and no hepatojugular reflux; brisk carotid pulses without delay and no carotid bruits Chest: clear to auscultation, no signs of consolidation by percussion  or palpation, normal fremitus, symmetrical and full respiratory excursions Cardiovascular: normal position and quality of the apical impulse, regular rhythm, normal first and paradoxically split second heart sounds, no murmurs, rubs or gallops Abdomen: no tenderness or distention, no masses by palpation, no abnormal pulsatility or arterial bruits, normal bowel sounds, no hepatosplenomegaly Extremities: no clubbing, cyanosis or edema; 2+ radial, ulnar and brachial pulses bilaterally; 2+ right femoral, posterior tibial and dorsalis pedis pulses; 2+ left femoral, posterior tibial and dorsalis pedis pulses; no subclavian or femoral bruits Neurological: grossly nonfocal Psych: Normal mood and affect    Wt Readings from Last 3 Encounters:  03/26/20 219 lb (  99.3 kg)  02/22/20 218 lb 9.6 oz (99.2 kg)  01/26/20 (!) 215 lb 9.6 oz (97.8 kg)      Studies/Labs Reviewed:   EKG:  EKG is not ordered today.  It shows atrial sensing (sinus rhythm) with demand ventricular pacing. + R wave V1 (CT shows RV lead in middle cardiac vein) Recent Labs: May 26, 2019 total cholesterol 151, HDL 53, LDL 47, triglycerides 255, hemoglobin A1c 6.0%,  December 11, 2019 hemoglobin 16.2, creatinine 0.56, potassium 4.3  ASSESSMENT:    1. CHB (complete heart block) (H[facility])   2. Pacemaker - dual chamber Medtronic 2010   3. Essential hypertension   4. Class 2 severe obesity due to excess calories with serious comorbidity and body mass index (BMI) of 35.0 to 35.9 in adult ([place])   5. Diabetes mellitus type 2 in obese ([place])   6. Mixed hyperlipidemia   7. Smoking      PLAN:  In order of problems listed above:  1. CHB: device dependent. 2. PPM: Normal device function.  Remote downloads every 3 months and yearly office visits.  Note that the CT shows that her right ventricular pacemaker lead is actually located in the middle cardiac vein.  Both the atrial and ventricular leads are MRI conditional but her Medtronic Adapta  generator is not.  If it is critical to have an MRI, we could probably make arrangements for scan with appropriate supervision, but ideally would avoid MRI scans until we get her new generator in about 40 years.   3. HTN: well controlled. With weight loss she may be able to stop at least one of her BP meds. 4. Obesity: Weight loss encouraged. Discussed diet changes. Back problems limit exercise. 5. DM: A1c improved. Still encourage weight loss which may "cure" her DM. 6. HLP: mildly elevated TG, other lipid parameters have improved and are within target range.. Cholesterol was OK. 7. Smoking:  Once again, cessation strongly recommended, especially since she has many other risk factors for coronary/vascular disease including diabetes mellitus and obesity and hypertension   Medication Adjustments/Labs and Tests Ordered: Current medicines are reviewed at length with the patient today.  Concerns regarding medicines are outlined above.  Medication changes, Labs and Tests ordered today are listed in the Patient Instructions below. Patient Instructions  Medication Instructions:  No changes *If you need a refill on your cardiac medications before your next appointment, please call your pharmacy*   Lab Work: None ordered If you have labs (blood work) drawn today and your tests are completely normal, you will receive your results only by: Marland Kitchen MyChart Message (if you have MyChart) OR . A paper copy in the mail If you have any lab test that is abnormal or we need to change your treatment, we will call you to review the results.   Testing/Procedures: None ordered   Follow-Up: At [facility], you and your health needs are our priority.  As part of our continuing mission to provide you with exceptional heart care, we have created designated Provider Care Teams.  These Care Teams include your primary Cardiologist (physician) and Advanced Practice Providers (APPs -  Physician Assistants and Nurse  Practitioners) who all work together to provide you with the care you need, when you need it.  We recommend signing up for the patient portal called "MyChart".  Sign up information is provided on this After Visit Summary.  MyChart is used to connect with patients for Virtual Visits (Telemedicine).  Patients are able to view  lab/test results, encounter notes, upcoming appointments, etc.  Non-urgent messages can be sent to your provider as well.   To learn more about what you can do with MyChart, go to [email or web address].    Your next appointment:   12 month(s)  The format for your next appointment:   In Person  Provider:   Sanda Klein, MD          Signed, Sanda Klein, MD  03/27/2020 9:45 AM    [facility] [address], [place], [place]  [address] Phone: [phone]; Fax: [phone]

## 2020-03-28 ENCOUNTER — Other Ambulatory Visit: Payer: Self-pay

## 2020-03-28 ENCOUNTER — Ambulatory Visit
Admission: RE | Admit: 2020-03-28 | Discharge: 2020-03-28 | Disposition: A | Discharge status: Home / Self Care | Payer: 59 | Source: Ambulatory Visit | Attending: Student | Admitting: Student

## 2020-03-28 DIAGNOSIS — M5416 Radiculopathy, lumbar region: Secondary | ICD-10-CM

## 2020-03-28 MED ORDER — IOPAMIDOL (ISOVUE-M 200) INJECTION 41%
1.0000 mL | Freq: Once | INTRAMUSCULAR | Status: AC
  Administered 2020-03-28: 1 mL via EPIDURAL

## 2020-03-28 MED ORDER — METHYLPREDNISOLONE ACETATE 40 MG/ML INJ SUSP (RADIOLOG
120.0000 mg | Freq: Once | INTRAMUSCULAR | Status: AC
  Administered 2020-03-28: 120 mg via EPIDURAL

## 2020-03-28 NOTE — Discharge Instructions (Signed)

## 2020-03-30 ENCOUNTER — Ambulatory Visit (INDEPENDENT_AMBULATORY_CARE_PROVIDER_SITE_OTHER): Payer: 59 | Admitting: Pulmonary Disease

## 2020-03-30 ENCOUNTER — Encounter: Payer: Self-pay | Admitting: Pulmonary Disease

## 2020-03-30 ENCOUNTER — Other Ambulatory Visit: Payer: Self-pay

## 2020-03-30 ENCOUNTER — Ambulatory Visit: Payer: 59 | Admitting: Pulmonary Disease

## 2020-03-30 VITALS — BP 128/80 | HR 96 | Temp 97.8°F | Ht 67.0 in | Wt 220.0 lb

## 2020-03-30 DIAGNOSIS — J439 Emphysema, unspecified: Secondary | ICD-10-CM

## 2020-03-30 DIAGNOSIS — R0602 Shortness of breath: Secondary | ICD-10-CM

## 2020-03-30 DIAGNOSIS — F17210 Nicotine dependence, cigarettes, uncomplicated: Secondary | ICD-10-CM | POA: Diagnosis not present

## 2020-03-30 LAB — PULMONARY FUNCTION TEST
DL/VA % pred: 106 %
DL/VA: 4.4 ml/min/mmHg/L
DLCO cor % pred: 87 %
DLCO cor: 19.47 ml/min/mmHg
DLCO unc % pred: 87 %
DLCO unc: 19.47 ml/min/mmHg
FEF 25-75 Post: 1.5 L/sec
FEF 25-75 Pre: 1.4 L/sec
FEF2575-%Change-Post: 7 %
FEF2575-%Pred-Post: 60 %
FEF2575-%Pred-Pre: 56 %
FEV1-%Change-Post: 1 %
FEV1-%Pred-Post: 64 %
FEV1-%Pred-Pre: 63 %
FEV1-Post: 1.81 L
FEV1-Pre: 1.78 L
FEV1FVC-%Change-Post: 1 %
FEV1FVC-%Pred-Pre: 96 %
FEV6-%Change-Post: 0 %
FEV6-%Pred-Post: 67 %
FEV6-%Pred-Pre: 67 %
FEV6-Post: 2.38 L
FEV6-Pre: 2.37 L
FEV6FVC-%Pred-Post: 103 %
FEV6FVC-%Pred-Pre: 103 %
FVC-%Change-Post: 0 %
FVC-%Pred-Post: 65 %
FVC-%Pred-Pre: 64 %
FVC-Post: 2.38 L
FVC-Pre: 2.37 L
Post FEV1/FVC ratio: 76 %
Post FEV6/FVC ratio: 100 %
Pre FEV1/FVC ratio: 75 %
Pre FEV6/FVC Ratio: 100 %
RV % pred: 146 %
RV: 3.17 L
TLC % pred: 101 %
TLC: 5.61 L

## 2020-03-30 NOTE — Patient Instructions (Signed)
I have reviewed your lung function test which does not show significant COPD There is however emphysema on your CT scan with small lung nodules that we will follow every other You can stop the inhaler if there is no benefit Continue to work on smoking cessation Continue to work on weight loss with diet and exercise which would make a significant improvement with your breathing  Follow-up in 1 year

## 2020-03-30 NOTE — Progress Notes (Signed)
Full PFT performed today. °

## 2020-03-30 NOTE — Progress Notes (Signed)
Mandy Sellers    [id number]    [birth year]  Primary Care Physician:Griffin, Jenny Reichmann, MD  Referring Physician: Lavone Orn, MD [facility] [employer] [place] [address],  [place] [address]  Chief complaint: Follow-up for dyspnea, emphysema  HPI: 59 year old smoker with history of hypertension, complete heart block status post pacemaker, diabetes Referred for evaluation of dyspnea on exertion  Complains of difficulty breathing, wheezing on exertion to the point where she becomes dizzy.  She cannot climb stairs or go up an incline without having to stop several times.  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  No mold, hot tub, Jacuzzi Smoking history: 60-pack-year smoker.  Continues to smoke 1.5 packs/day Travel history: Originally from [place].  No significant recent travel Relevant family history: No significant family history of lung disease  Interim history: Here for review of CT and PFTs She was started on Anoro inhaler at last visit but she is taking only on some days.  She cannot tell if it is helping with her breathing Continues to smoke cigarettes   Outpatient Encounter Medications as of 03/30/2020  Medication Sig  . albuterol (VENTOLIN HFA) 108 (90 Base) MCG/ACT inhaler   . aspirin EC 81 MG tablet Take 81 mg by mouth at bedtime.  . Cholecalciferol (VITAMIN D-3) 1000 UNITS CAPS Take 1,000 Units by mouth daily.  . clonazePAM (KLONOPIN) 1 MG tablet Take 1 mg by mouth at bedtime as needed.  . hydrochlorothiazide (MICROZIDE) 12.5 MG capsule TAKE 1 CAPSULE BY MOUTH  DAILY  . metoprolol succinate (TOPROL-XL) 25 MG 24 hr tablet Take 25 mg by mouth daily.  . Multiple Vitamin (MULTIVITAMIN WITH MINERALS) TABS tablet Take 1 tablet by mouth daily.  . Omega-3 Fatty Acids (FISH OIL) 1000 MG CAPS Take 2,000 mg by mouth daily.  Marland Kitchen  umeclidinium-vilanterol (ANORO ELLIPTA) 62.5-25 MCG/INH AEPB Inhale 1 puff into the lungs daily.  Marland Kitchen venlafaxine XR (EFFEXOR-XR) 75 MG 24 hr capsule Take 75 mg by mouth every evening.   No facility-administered encounter medications on file as of 03/30/2020.    Physical Exam: Blood pressure 128/80, pulse 96, temperature 97.8 F (36.6 C), temperature source Oral, height 5\' 7"  (1.702 m), weight 220 lb (99.8 kg), last menstrual period 06/15/2013, SpO2 98 %. Gen:      No acute distress HEENT:  EOMI, sclera anicteric Neck:     No masses; no thyromegaly Lungs:    Clear to auscultation bilaterally; normal respiratory effort CV:         Regular rate and rhythm; no murmurs Abd:      + bowel sounds; soft, non-tender; no palpable masses, no distension Ext:    No edema; adequate peripheral perfusion Skin:      Warm and dry; no rash Neuro: alert and oriented x 3 Psych: normal mood and affect  Data Reviewed: Imaging: Screening CT chest 05/26/2018-mild emphysema, biapical scarring.  Stable right upper lobe 3 mm nodule Chest x-ray 01/26/2020-mild cardiomegaly CT chest 02/22/2020-mild emphysema, biapical scarring, stable lung nodules. I have reviewed the images personally.  PFTs: 03/30/2020 FVC 2.37 [64%], FEV1 1.78 [63%], F/F 75, TLC 5.61 [101%], DLCO 19.47 [87%] Normal test  Labs: CBC 01/26/2020-WBC 10.4, eos 0.1%, absolute eosinophil count 10 IgE 01/26/2019 1-32 Alpha-1 antitrypsin 01/26/2020-165, PI MM  Assessment:  Emphysema PFTs reviewed with no significant obstruction in spite of significant  smoking history She cannot tell LABA/LAMA inhaler is helping and will stop.  She will call us back if there is any worsening of her breathing Suspect her dyspnea is likely secondary to deconditioning and body habitus  Advised weight loss with diet and exercise  Active smoker Discussed smoking cessation with patient.  She had tried Chantix and nicotine in the past and is not interested in  reattempting Time spent counseling-5 minutes.  Reassess at return visit  Lung nodules Stable Continue low-dose screening CTs  Health maintenance Pneumovax 01/04/2008 Immunized against Covid Prefers to wait to get the flu vaccine  Plan/Recommendations: Stop Anoro Smoking cessation, weight loss Continue low-dose screening CT of chest.  Marshell Garfinkel MD [facility] 03/30/2020, 3:23 PM  CC: Lavone Orn, MD

## 2020-04-11 ENCOUNTER — Ambulatory Visit (INDEPENDENT_AMBULATORY_CARE_PROVIDER_SITE_OTHER): Payer: 59

## 2020-04-11 DIAGNOSIS — I442 Atrioventricular block, complete: Secondary | ICD-10-CM

## 2020-04-11 LAB — CUP PACEART REMOTE DEVICE CHECK
Battery Impedance: 1858 Ohm
Battery Remaining Longevity: 38 mo
Battery Voltage: 2.76 V
Brady Statistic AP VP Percent: 1 %
Brady Statistic AP VS Percent: 0 %
Brady Statistic AS VP Percent: 98 %
Brady Statistic AS VS Percent: 1 %
Date Time Interrogation Session: 20211006082509
Implantable Lead Implant Date: 20100924
Implantable Lead Implant Date: 20100924
Implantable Lead Location: 753859
Implantable Lead Location: 753860
Implantable Lead Model: 5076
Implantable Lead Model: 5076
Implantable Pulse Generator Implant Date: 20100924
Lead Channel Impedance Value: 460 Ohm
Lead Channel Impedance Value: 475 Ohm
Lead Channel Pacing Threshold Amplitude: 0.375 V
Lead Channel Pacing Threshold Amplitude: 0.625 V
Lead Channel Pacing Threshold Pulse Width: 0.4 ms
Lead Channel Pacing Threshold Pulse Width: 0.4 ms
Lead Channel Setting Pacing Amplitude: 1.5 V
Lead Channel Setting Pacing Amplitude: 2 V
Lead Channel Setting Pacing Pulse Width: 0.4 ms
Lead Channel Setting Sensing Sensitivity: 4 mV

## 2020-04-14 NOTE — Progress Notes (Signed)
Remote pacemaker transmission.   

## 2020-04-25 ENCOUNTER — Other Ambulatory Visit: Payer: Self-pay | Admitting: Student

## 2020-04-27 ENCOUNTER — Other Ambulatory Visit: Payer: Self-pay | Admitting: Student

## 2020-05-28 ENCOUNTER — Other Ambulatory Visit: Payer: Self-pay | Admitting: Obstetrics and Gynecology

## 2020-05-28 DIAGNOSIS — N6310 Unspecified lump in the right breast, unspecified quadrant: Secondary | ICD-10-CM

## 2020-07-10 ENCOUNTER — Other Ambulatory Visit: Payer: 59

## 2020-07-11 ENCOUNTER — Ambulatory Visit (INDEPENDENT_AMBULATORY_CARE_PROVIDER_SITE_OTHER): Payer: 59

## 2020-07-11 DIAGNOSIS — I442 Atrioventricular block, complete: Secondary | ICD-10-CM | POA: Diagnosis not present

## 2020-07-12 LAB — CUP PACEART REMOTE DEVICE CHECK
Battery Impedance: 1890 Ohm
Battery Remaining Longevity: 37 mo
Battery Voltage: 2.75 V
Brady Statistic AP VP Percent: 1 %
Brady Statistic AP VS Percent: 0 %
Brady Statistic AS VP Percent: 99 %
Brady Statistic AS VS Percent: 1 %
Date Time Interrogation Session: 20220106142626
Implantable Lead Implant Date: 20100924
Implantable Lead Implant Date: 20100924
Implantable Lead Location: 753859
Implantable Lead Location: 753860
Implantable Lead Model: 5076
Implantable Lead Model: 5076
Implantable Pulse Generator Implant Date: 20100924
Lead Channel Impedance Value: 459 Ohm
Lead Channel Impedance Value: 486 Ohm
Lead Channel Pacing Threshold Amplitude: 0.375 V
Lead Channel Pacing Threshold Amplitude: 0.625 V
Lead Channel Pacing Threshold Pulse Width: 0.4 ms
Lead Channel Pacing Threshold Pulse Width: 0.4 ms
Lead Channel Setting Pacing Amplitude: 1.5 V
Lead Channel Setting Pacing Amplitude: 2 V
Lead Channel Setting Pacing Pulse Width: 0.4 ms
Lead Channel Setting Sensing Sensitivity: 4 mV

## 2020-07-19 ENCOUNTER — Other Ambulatory Visit: Payer: Self-pay | Admitting: Student

## 2020-07-19 DIAGNOSIS — M5416 Radiculopathy, lumbar region: Secondary | ICD-10-CM

## 2020-07-25 NOTE — Progress Notes (Signed)
Remote pacemaker transmission.   

## 2020-07-31 ENCOUNTER — Other Ambulatory Visit: Payer: Self-pay | Admitting: Student

## 2020-07-31 DIAGNOSIS — M5416 Radiculopathy, lumbar region: Secondary | ICD-10-CM

## 2020-08-07 ENCOUNTER — Other Ambulatory Visit: Payer: Self-pay

## 2020-08-07 ENCOUNTER — Other Ambulatory Visit: Payer: Self-pay | Admitting: Student

## 2020-08-07 ENCOUNTER — Ambulatory Visit
Admission: RE | Admit: 2020-08-07 | Discharge: 2020-08-07 | Disposition: A | Discharge status: Home / Self Care | Payer: 59 | Source: Ambulatory Visit | Attending: Student | Admitting: Student

## 2020-08-07 DIAGNOSIS — M5416 Radiculopathy, lumbar region: Secondary | ICD-10-CM

## 2020-08-07 MED ORDER — IOPAMIDOL (ISOVUE-M 200) INJECTION 41%
1.0000 mL | Freq: Once | INTRAMUSCULAR | Status: AC
  Administered 2020-08-07: 1 mL via EPIDURAL

## 2020-08-07 MED ORDER — METHYLPREDNISOLONE ACETATE 40 MG/ML INJ SUSP (RADIOLOG
120.0000 mg | Freq: Once | INTRAMUSCULAR | Status: AC
  Administered 2020-08-07: 120 mg via EPIDURAL

## 2020-08-07 NOTE — Discharge Instructions (Signed)

## 2020-08-28 DIAGNOSIS — M461 Sacroiliitis, not elsewhere classified: Secondary | ICD-10-CM | POA: Insufficient documentation

## 2020-09-14 ENCOUNTER — Other Ambulatory Visit: Payer: Self-pay | Admitting: Cardiovascular Disease

## 2020-09-30 ENCOUNTER — Other Ambulatory Visit: Payer: Self-pay | Admitting: Cardiovascular Disease

## 2020-10-10 ENCOUNTER — Ambulatory Visit (INDEPENDENT_AMBULATORY_CARE_PROVIDER_SITE_OTHER): Payer: 59

## 2020-10-10 DIAGNOSIS — I442 Atrioventricular block, complete: Secondary | ICD-10-CM

## 2020-10-11 LAB — CUP PACEART REMOTE DEVICE CHECK
Battery Impedance: 2272 Ohm
Battery Remaining Longevity: 33 mo
Battery Voltage: 2.75 V
Brady Statistic AP VP Percent: 1 %
Brady Statistic AP VS Percent: 0 %
Brady Statistic AS VP Percent: 98 %
Brady Statistic AS VS Percent: 1 %
Date Time Interrogation Session: 20220407135323
Implantable Lead Implant Date: 20100924
Implantable Lead Implant Date: 20100924
Implantable Lead Location: 753859
Implantable Lead Location: 753860
Implantable Lead Model: 5076
Implantable Lead Model: 5076
Implantable Pulse Generator Implant Date: 20100924
Lead Channel Impedance Value: 515 Ohm
Lead Channel Impedance Value: 566 Ohm
Lead Channel Pacing Threshold Amplitude: 0.375 V
Lead Channel Pacing Threshold Amplitude: 0.625 V
Lead Channel Pacing Threshold Pulse Width: 0.4 ms
Lead Channel Pacing Threshold Pulse Width: 0.4 ms
Lead Channel Setting Pacing Amplitude: 1.5 V
Lead Channel Setting Pacing Amplitude: 2 V
Lead Channel Setting Pacing Pulse Width: 0.4 ms
Lead Channel Setting Sensing Sensitivity: 4 mV

## 2020-10-16 ENCOUNTER — Other Ambulatory Visit: Payer: Self-pay | Admitting: Cardiovascular Disease

## 2020-10-22 NOTE — Progress Notes (Signed)
Remote pacemaker transmission.   

## 2020-12-21 ENCOUNTER — Telehealth: Payer: Self-pay | Admitting: Cardiovascular Disease

## 2020-12-21 MED ORDER — METOPROLOL SUCCINATE ER 50 MG PO TB24: ORAL_TABLET | ORAL | 0 refills | Status: DC

## 2020-12-21 NOTE — Telephone Encounter (Signed)
*  STAT* If patient is at the pharmacy, call can be transferred to refill team.   1. Which medications need to be refilled? (please list name of each medication and dose if known) Metoprolol  2. Which pharmacy/location (including street and city if local pharmacy) is medication to be sent to? [facility]  3. Do they need a 30 day or 90 day supply? [place]

## 2021-01-03 ENCOUNTER — Encounter: Payer: 59 | Attending: Internal Medicine

## 2021-01-10 ENCOUNTER — Ambulatory Visit: Payer: 59

## 2021-01-16 ENCOUNTER — Telehealth: Payer: Self-pay

## 2021-01-16 NOTE — Telephone Encounter (Signed)
The patient states she spoke with [employer] and they are sending her a 4g adapter but it is on backorder until October. She will not be able to send a transmission. She has an appointment in September to be seen in office.

## 2021-01-17 ENCOUNTER — Ambulatory Visit: Payer: 59

## 2021-01-22 ENCOUNTER — Ambulatory Visit: Payer: 59

## 2021-01-29 ENCOUNTER — Encounter: Payer: Self-pay | Admitting: Dietician

## 2021-01-29 ENCOUNTER — Encounter: Payer: 59 | Attending: Internal Medicine | Admitting: Dietician

## 2021-01-29 ENCOUNTER — Other Ambulatory Visit: Payer: Self-pay

## 2021-01-29 DIAGNOSIS — E1169 Type 2 diabetes mellitus with other specified complication: Secondary | ICD-10-CM | POA: Diagnosis present

## 2021-01-29 DIAGNOSIS — E669 Obesity, unspecified: Secondary | ICD-10-CM | POA: Diagnosis present

## 2021-01-29 NOTE — Progress Notes (Signed)
Patient was seen on 01/29/2021 for the first of a series of 3 diabetes self-management courses at [facility].  The second class was also started.   Patient Education Plan per assessed needs and concerns is to attend three course education program for Diabetes Self Management Education.  Patient was not checking her blood glucose and wished to do so. Blood glucose meter provided:  One Touch Verio Flex Lot [id number] x Expiration date 04/05/2021 Blood glucose 99  The following learning objectives were met by the patient during this class: Describe diabetes, types of diabetes and pathophysiology State some common risk factors for diabetes Defines the role of glucose and insulin Describe the relationship between diabetes and cardiovascular and other risks State the members of the Healthcare Team States the rationale for glucose monitoring and when to test State their individual [place] the importance of logging glucose readings and how to interpret the readings Identifies A1C target Explain the correlation between A1c and eAG values State symptoms and treatment of high blood glucose and low blood glucose Explain proper technique for glucose testing and identify proper sharps disposal Describe the role of different macronutrients on glucose Explain how carbohydrates affect blood glucose State what foods contain the most carbohydrates   Handouts given during class include: How to Thrive:  A Guide for Your Journey with Diabetes by the ADA Meal Plan Card and carbohydrate content list Dietary intake form Low Sodium Flavoring Tips Types of Fats Dining Out Label reading Snack list Planning a balanced meal The diabetes portion plate Diabetes Resources A1c to eAG Conversion Chart Blood Glucose Log Diabetes Recommended Care Schedule Support Group Diabetes Success Plan Core Class Satisfaction Survey   Goals:  Increase fiber in your  diet Identify carbohydrate containing foods in your meal Choose a balanced meal to provide small amounts of protein, carbohydrate, and increased vegetables Monitor glucose levels as instructed by your doctor   Follow-Up Plan: Attend Core 2/3 Work towards following your personal food plan.

## 2021-02-05 ENCOUNTER — Encounter: Payer: Self-pay | Admitting: Dietician

## 2021-02-05 ENCOUNTER — Other Ambulatory Visit: Payer: Self-pay

## 2021-02-05 ENCOUNTER — Encounter: Payer: 59 | Attending: Internal Medicine | Admitting: Dietician

## 2021-02-05 DIAGNOSIS — E1169 Type 2 diabetes mellitus with other specified complication: Secondary | ICD-10-CM | POA: Diagnosis not present

## 2021-02-05 DIAGNOSIS — E669 Obesity, unspecified: Secondary | ICD-10-CM | POA: Insufficient documentation

## 2021-02-05 NOTE — Progress Notes (Signed)
Patient was seen on 02/05/2021 to complete the second of a series of three diabetes self-management courses at the [facility]. The following learning objectives were met by the patient during this class:  Describe the role of different macronutrients on glucose Explain how carbohydrates affect blood glucose State what foods contain the most carbohydrates Demonstrate carbohydrate counting Demonstrate how to read Nutrition Facts food label Describe effects of various fats on heart health Describe the importance of good nutrition for health and healthy eating strategies Describe techniques for managing your shopping, cooking and meal planning List strategies to follow meal plan when dining out Describe the effects of alcohol on glucose and how to use it safely  Goals:  Follow Diabetes Meal Plan as instructed  Aim to spread carbs evenly throughout the day  Aim for 3 meals per day and snacks as needed Include lean protein foods to meals/snacks  Monitor glucose levels as instructed by your doctor   Follow-Up Plan: Attend Core 3 Work towards following your personal food plan.    Patient was seen on 02/05/2021 for the third of a series of three diabetes self-management courses at the [facility].   State the amount of activity recommended for healthy living Describe activities suitable for individual needs Identify ways to regularly incorporate activity into daily life Identify barriers to activity and ways to over come these barriers Identify diabetes medications being personally used and their primary action for lowering glucose and possible side effects Describe role of stress on blood glucose and develop strategies to address psychosocial issues Identify diabetes complications and ways to prevent them Explain how to manage diabetes during illness Evaluate success in meeting personal goal Establish 2-3 goals that they will plan to  diligently work on  Goals:  I will be active 10 minutes or more 4 times a week To help manage stress I will  walk at least 3 times a week  Your patient has identified these potential barriers to change:  Motivation  Your patient has identified their diabetes self-care support plan as  On-line Resources    Plan:  Attend Support Group as desired

## 2021-02-11 ENCOUNTER — Other Ambulatory Visit: Payer: Self-pay | Admitting: Acute Care

## 2021-02-11 DIAGNOSIS — Z122 Encounter for screening for malignant neoplasm of respiratory organs: Secondary | ICD-10-CM

## 2021-02-28 ENCOUNTER — Other Ambulatory Visit: Payer: Self-pay | Admitting: Cardiovascular Disease

## 2021-02-28 NOTE — Telephone Encounter (Signed)
Rx(s) sent to pharmacy electronically.  

## 2021-03-01 ENCOUNTER — Ambulatory Visit
Admission: RE | Admit: 2021-03-01 | Discharge: 2021-03-01 | Disposition: A | Discharge status: Home / Self Care | Payer: 59 | Source: Ambulatory Visit | Attending: Acute Care | Admitting: Acute Care

## 2021-03-01 DIAGNOSIS — Z122 Encounter for screening for malignant neoplasm of respiratory organs: Secondary | ICD-10-CM

## 2021-03-13 NOTE — Progress Notes (Signed)
Sent this one top Dr. Valeta Harms to review. Will let you know if he feels a 3 month follow up is ok. Thanks

## 2021-03-14 NOTE — Progress Notes (Signed)
Attempted to call again 9/8. Left another HIPPA compliant message asking patient to return our call. She just needs a 3 month follow up per [facility], so 3 month CT and fax results to PCP. Thanks so much

## 2021-03-20 ENCOUNTER — Telehealth: Payer: Self-pay | Admitting: Acute Care

## 2021-03-20 DIAGNOSIS — F17210 Nicotine dependence, cigarettes, uncomplicated: Secondary | ICD-10-CM

## 2021-03-21 NOTE — Telephone Encounter (Signed)
I have called the patient with the results of her low-dose CT. I explained that her scan was read as a lung RADS 4A and will require a 17-monthfollow-up to ensure that the nodule of concern is stable. The patient is in agreement with this plan. I also discussed with the patient that there was notation of some smoking-related respiratory bronchiolitis interstitial lung disease noted on the scan that had not been previously noted on last year scan.  She states that she has had increased shortness of breath within the last year.  She had been prescribed Anoro but she is not using it currently.  Patient is seen by Dr. MVaughan Brownertherefore , DLangley Gausscan you please make sure this patient gets placed on his schedule for a follow-up the reason being new notation of interstitial lung disease on most recent low-dose CT. Denise please place order for 364-monthollow-up screening scan and fax results to PCP.  Thanks so much  I had a long conversation with this patient regarding her smoking.  We discussed smoking cessation options.  She states that she has tried all medical options and I asked her she had considered either acupuncture or hypnosis.  She is actually very interested in trying acupuncture therefore I have sent her contact number for a few of the acupuncture businesses who perform smoking cessation services.  Additionally I have sent her an option for hypnosis.  She understands that quitting smoking is paramount to preventing further pulmonary disease.

## 2021-03-25 NOTE — Telephone Encounter (Signed)
Spoke with pt and scheduled ov with Dr Vaughan Browner.  Order placed for 3 mth f/u nodule f/u CT.  Copy of CT faxed to PCP.

## 2021-03-25 NOTE — Addendum Note (Signed)
Addended by: Doroteo Glassman D on: 03/25/2021 09:56 AM   Modules accepted: Orders

## 2021-03-27 ENCOUNTER — Other Ambulatory Visit: Payer: Self-pay | Admitting: Cardiovascular Disease

## 2021-03-30 ENCOUNTER — Encounter: Payer: Self-pay | Admitting: *Deleted

## 2021-03-31 NOTE — Progress Notes (Signed)
I have called the patient with the results of her low-dose CT. I explained that her scan was read as a lung RADS 4A and will require a 83-month follow-up to ensure that the nodule of concern is stable. The patient is in agreement with this plan. I also discussed with the patient that there was notation of some smoking-related respiratory bronchiolitis interstitial lung disease noted on the scan that had not been previously noted on last year scan.  She states that she has had increased shortness of breath within the last year.  She had been prescribed Anoro but she is not using it currently.  Patient is seen by Dr. Vaughan Browner therefore , Langley Gauss can you please make sure this patient gets placed on his schedule for a follow-up the reason being new notation of interstitial lung disease on most recent low-dose CT. Denise please place order for 37-month follow-up screening scan and fax results to PCP.  Thanks so much  I had a long conversation with this patient regarding her smoking.  We discussed smoking cessation options.  She states that she has tried all medical options and I asked her she had considered either acupuncture or hypnosis.  She is actually very interested in trying acupuncture therefore I have sent her contact number for a few of the acupuncture businesses who perform smoking cessation services.  Additionally I have sent her an option for hypnosis.  She understands that quitting smoking is paramount to preventing further pulmonary disease  Doroteo Glassman RN Spoke with pt and scheduled ov with Dr Vaughan Browner.  Order placed for 3 mth f/u nodule f/u CT.  Copy of CT faxed to PCP.

## 2021-04-01 ENCOUNTER — Encounter: Payer: 59 | Admitting: Cardiovascular Disease

## 2021-04-16 ENCOUNTER — Other Ambulatory Visit: Payer: Self-pay

## 2021-04-16 ENCOUNTER — Encounter: Payer: Self-pay | Admitting: Pulmonary Disease

## 2021-04-16 ENCOUNTER — Ambulatory Visit: Payer: 59 | Admitting: Pulmonary Disease

## 2021-04-16 VITALS — BP 122/66 | HR 110 | Temp 98.3°F | Ht 66.5 in | Wt 197.8 lb

## 2021-04-16 DIAGNOSIS — J439 Emphysema, unspecified: Secondary | ICD-10-CM

## 2021-04-16 DIAGNOSIS — F17210 Nicotine dependence, cigarettes, uncomplicated: Secondary | ICD-10-CM | POA: Diagnosis not present

## 2021-04-16 NOTE — Progress Notes (Signed)
Mandy Sellers    [id number]    [birth year]  Primary Care Physician:Griffin, Jenny Reichmann, MD  Referring Physician: Lavone Orn, MD [place] [facility] [employer] [place] [address],   [id number]  Chief complaint: Follow-up for dyspnea, emphysema  HPI: 60 year old smoker with history of hypertension, complete heart block status post pacemaker, diabetes Referred for evaluation of dyspnea on exertion  Complains of difficulty breathing, wheezing on exertion to the point where she becomes dizzy.  She cannot climb stairs or go up an incline without having to stop several times.  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  No mold, hot tub, Jacuzzi Smoking history: 60-pack-year smoker.  Continues to smoke 1.5 packs/day Travel history: Originally from [place].  No significant recent travel Relevant family history: No significant family history of lung disease  Interim history: Remains off anoro inhaler She has albuterol but is using it only intermittently Complains of chronic dyspnea on exertion without change  She had a recent due to screening CT which showed new lung nodule and upper lobe groundglass nodules suggestive of smoking-related changes. She is here for review of scan  Continues to smoke cigarettes   Outpatient Encounter Medications as of 04/16/2021  Medication Sig   albuterol (VENTOLIN HFA) 108 (90 Base) MCG/ACT inhaler    aspirin EC 81 MG tablet Take 81 mg by mouth at bedtime.   Cholecalciferol (VITAMIN D-3) 1000 UNITS CAPS Take 1,000 Units by mouth daily.   clonazePAM (KLONOPIN) 1 MG tablet Take 1 mg by mouth at bedtime as needed.   hydrochlorothiazide (MICROZIDE) 12.5 MG capsule TAKE 1 CAPSULE BY MOUTH  DAILY   metFORMIN (GLUCOPHAGE) 1000 MG tablet Take 1,000 mg by mouth 2 (two) times daily with a meal.   metoprolol succinate  (TOPROL-XL) 50 MG 24 hr tablet TAKE 1 TABLET BY MOUTH  DAILY WITH OR IMMEDIATELY  FOLLOWING A MEAL   Multiple Vitamin (MULTIVITAMIN WITH MINERALS) TABS tablet Take 1 tablet by mouth daily.   Omega-3 Fatty Acids (FISH OIL) 1000 MG CAPS Take 2,000 mg by mouth daily.   umeclidinium-vilanterol (ANORO ELLIPTA) 62.5-25 MCG/INH AEPB Inhale 1 puff into the lungs daily.   venlafaxine XR (EFFEXOR-XR) 75 MG 24 hr capsule Take 75 mg by mouth every evening.   metoprolol succinate (TOPROL-XL) 25 MG 24 hr tablet Take 25 mg by mouth daily.   No facility-administered encounter medications on file as of 04/16/2021.    Physical Exam: Blood pressure 122/66, pulse (!) 110, temperature 98.3 F (36.8 C), temperature source Oral, height 5' 6.5" (1.689 m), weight 197 lb 12.8 oz (89.7 kg), last menstrual period 06/15/2013, SpO2 97 %. Gen:      No acute distress HEENT:  EOMI, sclera anicteric Neck:     No masses; no thyromegaly Lungs:    Clear to auscultation bilaterally; normal respiratory effort CV:         Regular rate and rhythm; no murmurs Abd:      + bowel sounds; soft, non-tender; no palpable masses, no distension Ext:    No edema; adequate peripheral perfusion Skin:      Warm and dry; no rash Neuro: alert and oriented x 3 Psych: normal mood and affect   Data Reviewed: Imaging: Screening CT chest 05/26/2018-mild emphysema, biapical scarring.  Stable right upper lobe 3 mm nodule Chest x-ray 01/26/2020-mild cardiomegaly CT chest 02/22/2020-mild  emphysema, biapical scarring, stable lung nodules. Low-dose screening CT 03/01/2021-increase in size of soft solid right lower lobe nodule with new 2 mm solid component.  Ill-defined centrilobular groundglass nodules in the upper lungs. I have reviewed the images personally.  PFTs: 03/30/2020 FVC 2.37 [64%], FEV1 1.78 [63%], F/F 75, TLC 5.61 [101%], DLCO 19.47 [87%] Normal test  Labs: CBC 01/26/2020-WBC 10.4, eos 0.1%, absolute eosinophil count 10 IgE 01/26/2019  1-32 Alpha-1 antitrypsin 01/26/2020-165, PI MM  Assessment:  Abnormal CT CT scan shows ill-defined centrilobular nodules consistent with smoking-related changes.  The images are adequate to discern a pattern and should not require high-res CT Will need to work on quitting smoking  Lung nodule 4B category on screening CT.  Follow-up ordered in 3 months  Emphysema PFTs reviewed with no significant obstruction in spite of significant smoking history  She is off LABA/LAMA inhaler and is just using albuterol as needed.  We discussed restarting controller medication but she would like to hold off given cost and as she did not perceive any benefit previously Advised weight loss with diet and exercise  Active smoker Discussed smoking cessation.  She has tried Chantix and nicotine in the past and does not want to reattempt. Offered referred to cessation program but she is not ready to quit  Health maintenance Pneumovax 01/04/2008 Immunized against Covid  Plan/Recommendations: Smoking cessation, weight loss Follow-up CT scan in 3 months  Marshell Garfinkel MD [facility] 04/16/2021, 2:22 PM  CC: Lavone Orn, MD

## 2021-04-16 NOTE — Progress Notes (Signed)
Mandy Sellers    [id number]    03-13-1961  Primary Care Physician:Griffin, Jenny Reichmann, MD  Referring Physician: Lavone Orn, MD [place] [employer] [place] [address],  [place] [address]  Chief complaint: Follow-up for dyspnea, emphysema  HPI: 60 year old smoker with history of hypertension, complete heart block status post pacemaker, diabetes Referred for evaluation of dyspnea on exertion  Complains of difficulty breathing, wheezing on exertion to the point where she becomes dizzy.  She cannot climb stairs or go up an incline without having to stop several times.  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  No mold, hot tub, Jacuzzi Smoking history: 60-pack-year smoker.  Continues to smoke 1.5 packs/day Travel history: Originally from [place].  No significant recent travel Relevant family history: No significant family history of lung disease  Interim history: Here for review of CT and PFTs She was started on Anoro inhaler at last visit but she is taking only on some days.  She cannot tell if it is helping with her breathing Continues to smoke cigarettes   Outpatient Encounter Medications as of 04/16/2021  Medication Sig   albuterol (VENTOLIN HFA) 108 (90 Base) MCG/ACT inhaler    aspirin EC 81 MG tablet Take 81 mg by mouth at bedtime.   Cholecalciferol (VITAMIN D-3) 1000 UNITS CAPS Take 1,000 Units by mouth daily.   clonazePAM (KLONOPIN) 1 MG tablet Take 1 mg by mouth at bedtime as needed.   hydrochlorothiazide (MICROZIDE) 12.5 MG capsule TAKE 1 CAPSULE BY MOUTH  DAILY   metFORMIN (GLUCOPHAGE) 1000 MG tablet Take 1,000 mg by mouth 2 (two) times daily with a meal.   metoprolol succinate (TOPROL-XL) 50 MG 24 hr tablet TAKE 1 TABLET BY MOUTH  DAILY WITH OR IMMEDIATELY  FOLLOWING A MEAL   Multiple Vitamin (MULTIVITAMIN WITH  MINERALS) TABS tablet Take 1 tablet by mouth daily.   Omega-3 Fatty Acids (FISH OIL) 1000 MG CAPS Take 2,000 mg by mouth daily.   umeclidinium-vilanterol (ANORO ELLIPTA) 62.5-25 MCG/INH AEPB Inhale 1 puff into the lungs daily.   venlafaxine XR (EFFEXOR-XR) 75 MG 24 hr capsule Take 75 mg by mouth every evening.   metoprolol succinate (TOPROL-XL) 25 MG 24 hr tablet Take 25 mg by mouth daily.   No facility-administered encounter medications on file as of 04/16/2021.    Physical Exam: Blood pressure 128/80, pulse 96, temperature 97.8 F (36.6 C), temperature source Oral, height 5\' 7"  (1.702 m), weight 220 lb (99.8 kg), last menstrual period 06/15/2013, SpO2 98 %. Gen:      No acute distress HEENT:  EOMI, sclera anicteric Neck:     No masses; no thyromegaly Lungs:    Clear to auscultation bilaterally; normal respiratory effort CV:         Regular rate and rhythm; no murmurs Abd:      + bowel sounds; soft, non-tender; no palpable masses, no distension Ext:    No edema; adequate peripheral perfusion Skin:      Warm and dry; no rash Neuro: alert and oriented x 3 Psych: normal mood and affect  Data Reviewed: Imaging: Screening CT chest 05/26/2018-mild emphysema, biapical scarring.  Stable right upper lobe 3 mm nodule Chest x-ray 01/26/2020-mild cardiomegaly CT chest 02/22/2020-mild emphysema, biapical scarring, stable lung nodules. I have reviewed the images personally.  PFTs: 03/30/2020 FVC 2.37 [64%], FEV1 1.78 [63%],  F/F 75, TLC 5.61 [101%], DLCO 19.47 [87%] Normal test  Labs: CBC 01/26/2020-WBC 10.4, eos 0.1%, absolute eosinophil count 10 IgE 01/26/2019 1-32 Alpha-1 antitrypsin 01/26/2020-165, PI MM  Assessment:  Emphysema PFTs reviewed with no significant obstruction in spite of significant smoking history She cannot tell LABA/LAMA inhaler is helping and will stop.  She will call us back if there is any worsening of her breathing Suspect her dyspnea is likely secondary to  deconditioning and body habitus  Advised weight loss with diet and exercise  Active smoker Discussed smoking cessation with patient.  She had tried Chantix and nicotine in the past and is not interested in reattempting Time spent counseling-5 minutes.  Reassess at return visit  Lung nodules Stable Continue low-dose screening CTs  Health maintenance Pneumovax 01/04/2008 Immunized against Covid Prefers to wait to get the flu vaccine  Plan/Recommendations: Stop Anoro Smoking cessation, weight loss Continue low-dose screening CT of chest.  Marshell Garfinkel MD [facility] 04/16/2021, 2:27 PM  CC: Lavone Orn, MD

## 2021-04-16 NOTE — Patient Instructions (Signed)
I have reviewed his CT scan which shows a lung nodule that we will follow-up in 3 months In addition there is some mild inflammatory changes that is from smoking Continue to work on smoking cessation  Continue albuterol inhaler Follow-up in 6 months

## 2021-04-17 ENCOUNTER — Telehealth: Payer: Self-pay | Admitting: Pulmonary Disease

## 2021-04-17 MED ORDER — ALBUTEROL SULFATE HFA 108 (90 BASE) MCG/ACT IN AERS: 1.0000 | INHALATION_SPRAY | RESPIRATORY_TRACT | 6 refills | Status: DC | PRN

## 2021-04-17 NOTE — Telephone Encounter (Signed)
I have called and LM on VM for the pt to be aware that the refill has been sent to the pharmacy for the rescue inhaler.  Nothing further is needed.

## 2021-04-26 ENCOUNTER — Ambulatory Visit (INDEPENDENT_AMBULATORY_CARE_PROVIDER_SITE_OTHER): Payer: 59

## 2021-04-26 DIAGNOSIS — I442 Atrioventricular block, complete: Secondary | ICD-10-CM | POA: Diagnosis not present

## 2021-04-26 LAB — CUP PACEART REMOTE DEVICE CHECK
Battery Impedance: 2933 Ohm
Battery Remaining Longevity: 25 mo
Battery Voltage: 2.74 V
Brady Statistic AP VP Percent: 1 %
Brady Statistic AP VS Percent: 0 %
Brady Statistic AS VP Percent: 98 %
Brady Statistic AS VS Percent: 1 %
Date Time Interrogation Session: 20221021065938
Implantable Lead Implant Date: 20100924
Implantable Lead Implant Date: 20100924
Implantable Lead Location: 753859
Implantable Lead Location: 753860
Implantable Lead Model: 5076
Implantable Lead Model: 5076
Implantable Pulse Generator Implant Date: 20100924
Lead Channel Impedance Value: 461 Ohm
Lead Channel Impedance Value: 570 Ohm
Lead Channel Pacing Threshold Amplitude: 0.375 V
Lead Channel Pacing Threshold Amplitude: 0.625 V
Lead Channel Pacing Threshold Pulse Width: 0.4 ms
Lead Channel Pacing Threshold Pulse Width: 0.4 ms
Lead Channel Setting Pacing Amplitude: 1.5 V
Lead Channel Setting Pacing Amplitude: 2 V
Lead Channel Setting Pacing Pulse Width: 0.4 ms
Lead Channel Setting Sensing Sensitivity: 5.6 mV

## 2021-05-06 ENCOUNTER — Other Ambulatory Visit: Payer: Self-pay

## 2021-05-06 ENCOUNTER — Ambulatory Visit (INDEPENDENT_AMBULATORY_CARE_PROVIDER_SITE_OTHER): Payer: 59 | Admitting: Cardiovascular Disease

## 2021-05-06 ENCOUNTER — Encounter: Payer: Self-pay | Admitting: Cardiovascular Disease

## 2021-05-06 VITALS — BP 120/70 | HR 88 | Ht 66.5 in | Wt 199.0 lb

## 2021-05-06 DIAGNOSIS — I10 Essential (primary) hypertension: Secondary | ICD-10-CM | POA: Diagnosis not present

## 2021-05-06 DIAGNOSIS — Z950 Presence of cardiac pacemaker: Secondary | ICD-10-CM | POA: Diagnosis not present

## 2021-05-06 DIAGNOSIS — I442 Atrioventricular block, complete: Secondary | ICD-10-CM | POA: Diagnosis not present

## 2021-05-06 DIAGNOSIS — E669 Obesity, unspecified: Secondary | ICD-10-CM

## 2021-05-06 DIAGNOSIS — E1169 Type 2 diabetes mellitus with other specified complication: Secondary | ICD-10-CM

## 2021-05-06 DIAGNOSIS — E782 Mixed hyperlipidemia: Secondary | ICD-10-CM

## 2021-05-06 DIAGNOSIS — F17200 Nicotine dependence, unspecified, uncomplicated: Secondary | ICD-10-CM

## 2021-05-06 MED ORDER — ALBUTEROL SULFATE HFA 108 (90 BASE) MCG/ACT IN AERS: 1.0000 | INHALATION_SPRAY | RESPIRATORY_TRACT | 6 refills | Status: AC | PRN

## 2021-05-06 NOTE — Progress Notes (Signed)
Patient ID: Mandy Sellers, female   DOB: [birth year], 60 y.o.   MRN: [id number]    Cardiology Office Note    Date:  05/06/2021   ID:  Mandy Sellers, DOB 1960-10-16, MRN [id number]  PCP:  Lavone Orn, MD  Cardiologist:   Sanda Klein, MD   Chief Complaint  Patient presents with   Pacemaker Check     History of Present Illness:  Mandy Sellers is a 60 y.o. female with complete heart block here for a pacemaker check (she does have a very slow ventricular escape rhythm at about 35 bpm, but feels poorly when she has ventricular pacing threshold checks).  From a cardiovascular point of view she is done well, but feel that she is torn between increasingly stressful work responsibilities and home life.  She is living with her daughter and her 2 grandsons ages 43 and 14 years old.  She continues to smoke about a pack and a half of cigarettes a day and recently has started taking alprazolam for anxiety.  It does not help too much.  She has occasional wheezing and understands how harmful the smoking is to her, but she has not been able to quit.  Chantix produced severe depression in the past.  She is now trying acupuncture and hypnosis.  She does not yet feel ready to commit to smoking cessation.  Her daughter has switched to vaping.  The patient specifically denies any chest pain at rest exertion, dyspnea at rest or with exertion, orthopnea, paroxysmal nocturnal dyspnea, syncope, palpitations, focal neurological deficits, intermittent claudication, lower extremity edema, unexplained weight gain, cough, hemoptysis or wheezing.  Most of her other cardiac risk factors are well controlled.  She has an LDL of 65 and a borderline low HDL of 42.  Her hemoglobin A1c was recently checked at 6.6%, on metformin monotherapy.  She has lost some weight since starting metformin.  Her blood pressure is excellent.  Pacemaker function is normal.  Her Medtronic Adapta device was implanted in 2010 and still  has about 24 months (possibly as much as 44 months) of estimated generator longevity.  She never paces in the atrium and has good heart rate histogram distribution.  She has over 98% ventricular paced rhythm.  There have been no episodes of high ventricular rate and no episodes of mode switch.  Both the atrial and ventricular leads are Medtronic 5076 leads.  Lead parameters remain excellent.   Past Medical History:  Diagnosis Date   CHB (complete heart block) ([place]) 08/09/2013   Depression    Diabetes mellitus without complication ([place])    HTN (hypertension) 08/09/2013   Hypertension    Obesity (BMI 30.0-34.9) 08/09/2013   Pacemaker    dual-chmber for CHB in 2010   Tobacco abuse     Past Surgical History:  Procedure Laterality Date   DILATATION & CURRETTAGE/HYSTEROSCOPY WITH RESECTOCOPE N/A 11/10/2013   Procedure: [place];  Surgeon: Darlyn Chamber, MD;  Location: [facility];  Service: Gynecology;  Laterality: N/A;   INCONTINENCE SURGERY  03/2000   INSERT / REPLACE / REMOVE PACEMAKER     NM MYOCAR PERF WALL MOTION  05/2009   bruce myoview; normal pattern of perfusion in all regions, low risk scan   PACEMAKER INSERTION  03/30/2009   for CHB; Medtronic Adapta ADDRL1 - Dr. Campti ECHOCARDIOGRAM  09/2010   EF=>55%; mild MR; trace pulm valve regurg    TUBAL LIGATION  03/2000  Outpatient Medications Prior to Visit  Medication Sig Dispense Refill   aspirin EC 81 MG tablet Take 81 mg by mouth at bedtime.     Cholecalciferol (VITAMIN D-3) 1000 UNITS CAPS Take 1,000 Units by mouth daily.     hydrochlorothiazide (MICROZIDE) 12.5 MG capsule TAKE 1 CAPSULE BY MOUTH  DAILY 90 capsule 3   metFORMIN (GLUCOPHAGE) 1000 MG tablet Take 1,000 mg by mouth 2 (two) times daily with a meal.     metoprolol succinate (TOPROL-XL) 50 MG 24 hr tablet TAKE 1 TABLET BY MOUTH  DAILY WITH OR IMMEDIATELY  FOLLOWING A MEAL 90 tablet 0   Multiple Vitamin  (MULTIVITAMIN WITH MINERALS) TABS tablet Take 1 tablet by mouth daily.     Omega-3 Fatty Acids (FISH OIL) 1000 MG CAPS Take 2,000 mg by mouth daily.     traMADol (ULTRAM) 50 MG tablet Take by mouth.     venlafaxine XR (EFFEXOR-XR) 75 MG 24 hr capsule Take 75 mg by mouth every evening.     albuterol (VENTOLIN HFA) 108 (90 Base) MCG/ACT inhaler Inhale 1-2 puffs into the lungs every 4 (four) hours as needed for wheezing or shortness of breath. (Patient not taking: Reported on 05/06/2021) 8 g 6   clonazePAM (KLONOPIN) 1 MG tablet Take 1 mg by mouth at bedtime as needed. (Patient not taking: Reported on 05/06/2021)     metoprolol succinate (TOPROL-XL) 25 MG 24 hr tablet Take 25 mg by mouth daily.     umeclidinium-vilanterol (ANORO ELLIPTA) 62.5-25 MCG/INH AEPB Inhale 1 puff into the lungs daily. 60 each 2   No facility-administered medications prior to visit.     Allergies:   Amoxicillin   Social History   Socioeconomic History   Marital status: Widowed    Spouse name: Not on file   Number of children: 1   Years of education: master's   Highest education level: Not on file  Occupational History    Employer: [employer]  Tobacco Use   Smoking status: Heavy Smoker    Packs/day: 1.50    Years: 44.00    Pack years: 66.00    Types: Cigarettes   Smokeless tobacco: Never  Substance and Sexual Activity   Alcohol use: Yes    Comment: social   Drug use: No   Sexual activity: Yes  Other Topics Concern   Not on file  Social History Narrative   Not on file   Social Determinants of Health   Financial Resource Strain: Not on file  Food Insecurity: Not on file  Transportation Needs: Not on file  Physical Activity: Not on file  Stress: Not on file  Social Connections: Not on file     Family History:  The patient's family history includes Alcohol abuse in her father; Deep vein thrombosis in her brother; Diabetes in her father and mother; Hypertension in her father; Stroke in her  father.   ROS:   Please see the history of present illness.    ROS  All other systems are reviewed and are negative.Marland Kitchen  PHYSICAL EXAM:   VS:  BP 120/70 (BP Location: Left Arm, Patient Position: Sitting, Cuff Size: Normal)   Pulse 88   Ht 5' 6.5" (1.689 m)   Wt 199 lb (90.3 kg)   LMP 06/15/2013   SpO2 95%   BMI 31.64 kg/m      General: Alert, oriented x3, no distress, mildly obese.  Healthy left subclavian pacemaker site Head: no evidence of trauma, PERRL, EOMI, no exophtalmos or  lid lag, no myxedema, no xanthelasma; normal ears, nose and oropharynx Neck: normal jugular venous pulsations and no hepatojugular reflux; brisk carotid pulses without delay and no carotid bruits Chest: clear to auscultation, no signs of consolidation by percussion or palpation, normal fremitus, symmetrical and full respiratory excursions Cardiovascular: normal position and quality of the apical impulse, regular rhythm, normal first and paradoxically split second heart sounds, no murmurs, rubs or gallops Abdomen: no tenderness or distention, no masses by palpation, no abnormal pulsatility or arterial bruits, normal bowel sounds, no hepatosplenomegaly Extremities: no clubbing, cyanosis or edema; 2+ radial, ulnar and brachial pulses bilaterally; 2+ right femoral, posterior tibial and dorsalis pedis pulses; 2+ left femoral, posterior tibial and dorsalis pedis pulses; no subclavian or femoral bruits Neurological: grossly nonfocal Psych: Normal mood and affect   Wt Readings from Last 3 Encounters:  05/06/21 199 lb (90.3 kg)  04/16/21 197 lb 12.8 oz (89.7 kg)  01/29/21 204 lb (92.5 kg)      Studies/Labs Reviewed:   EKG: Shows atrial sensed, ventricular paced rhythm.  She has a positive R wave in lead V1.  Her CT shows RV lead in middle cardiac vein) Recent Labs: May 26, 2019 total cholesterol 151, HDL 53, LDL 47, triglycerides 255, hemoglobin A1c 6.0%,  December 11, 2019 hemoglobin 16.2, creatinine 0.56,  potassium 4.3  06/08/2020 Cholesterol 177, HDL 42, triglycerides 458 Creatinine 0.67, potassium 3.9, ALT 27  03/25/2021 Hemoglobin A1c 6.6%  ASSESSMENT:    1. CHB (complete heart block) ([facility])   2. Pacemaker - dual chamber Medtronic 2010   3. Essential hypertension   4. Obesity (BMI 30.0-34.9)   5. Diabetes mellitus type 2 in obese ([place])   6. Mixed hyperlipidemia   7. Smoking      PLAN:  In order of problems listed above:  CHB: Although she does have a very slow ventricular escape rhythm, she tolerates lead threshold testing very poorly.  She should be considered pacemaker dependent. PPM: Normal device function with estimated generator longevity of another 2 years.  Continue remote downloads every 3 months and yearly office visits.  Note that the CT shows that her right ventricular pacemaker lead is actually located in the middle cardiac vein.  Both the atrial and ventricular leads are MRI conditional but her Medtronic Adapta generator is not.  When she gets a new generator in a couple of years her whole system should be MRI conditional. HTN: Excellent control.  If she continues to lose weight and blood pressure drops further, we will try to discontinue her hydrochlorothiazide. Obesity: Has lost a little weight since she started metformin.  Back problems limit exercise. DM: Unfortunately her glycemic control has worsened.  Acceptable on metformin.  If additional medications are necessary SGLT2 inhibitors and GLP-1 agonist are preferred over sulfonylureas.  I would definitely avoid Actos. HLP: Recheck triglycerides which were markedly elevated a year ago.  HDL is also borderline low, all features of metabolic syndrome/insulin resistance.  Weight loss and some form of exercise would be beneficial.  LDL has not been a problem. Smoking: Not withstanding the recent diagnosis of diabetes mellitus, smoking remains her most potent and as yet unaddressed risk factor.  Strongly recommend all  efforts to quit smoking permanently as soon as possible.   Medication Adjustments/Labs and Tests Ordered: Current medicines are reviewed at length with the patient today.  Concerns regarding medicines are outlined above.  Medication changes, Labs and Tests ordered today are listed in the Patient Instructions below. Patient Instructions  Medication Instructions:  No changes *If you need a refill on your cardiac medications before your next appointment, please call your pharmacy*   Lab Work: None ordered If you have labs (blood work) drawn today and your tests are completely normal, you will receive your results only by: [place] (if you have MyChart) OR A paper copy in the mail If you have any lab test that is abnormal or we need to change your treatment, we will call you to review the results.   Testing/Procedures: None ordered   Follow-Up: At [facility], you and your health needs are our priority.  As part of our continuing mission to provide you with exceptional heart care, we have created designated Provider Care Teams.  These Care Teams include your primary Cardiologist (physician) and Advanced Practice Providers (APPs -  Physician Assistants and Nurse Practitioners) who all work together to provide you with the care you need, when you need it.  We recommend signing up for the patient portal called "MyChart".  Sign up information is provided on this After Visit Summary.  MyChart is used to connect with patients for Virtual Visits (Telemedicine).  Patients are able to view lab/test results, encounter notes, upcoming appointments, etc.  Non-urgent messages can be sent to your provider as well.   To learn more about what you can do with MyChart, go to [email or web address].    Your next appointment:   12 month(s)  The format for your next appointment:   In Person  Provider:   Sanda Klein, MD      Signed, Sanda Klein, MD  05/06/2021 5:34 PM    [address], [place], [place]  [address] Phone: [phone]; Fax: [phone]

## 2021-05-06 NOTE — Progress Notes (Signed)
Remote pacemaker transmission.   

## 2021-05-06 NOTE — Patient Instructions (Signed)

## 2021-05-16 DIAGNOSIS — M25562 Pain in left knee: Secondary | ICD-10-CM | POA: Insufficient documentation

## 2021-06-07 ENCOUNTER — Telehealth: Payer: Self-pay | Admitting: Acute Care

## 2021-06-07 NOTE — Telephone Encounter (Signed)
Mandy Sellers.  This looks like this is yours.

## 2021-06-07 NOTE — Telephone Encounter (Signed)
Noted.   Will route to [facility] team as they handle PA's for CT's.

## 2021-06-10 ENCOUNTER — Ambulatory Visit
Admission: RE | Admit: 2021-06-10 | Discharge: 2021-06-10 | Disposition: A | Discharge status: Home / Self Care | Payer: 59 | Source: Ambulatory Visit | Attending: Acute Care | Admitting: Acute Care

## 2021-06-10 ENCOUNTER — Other Ambulatory Visit: Payer: Self-pay

## 2021-06-10 DIAGNOSIS — F17210 Nicotine dependence, cigarettes, uncomplicated: Secondary | ICD-10-CM

## 2021-06-10 NOTE — Telephone Encounter (Signed)
I spoke with Mandy Sellers on Friday 06/07/21 and she is suppose to get the correct code from the person telling her that it is not correct

## 2021-06-14 ENCOUNTER — Other Ambulatory Visit: Payer: Self-pay | Admitting: Acute Care

## 2021-06-14 ENCOUNTER — Telehealth: Payer: Self-pay | Admitting: Acute Care

## 2021-06-14 DIAGNOSIS — Z87891 Personal history of nicotine dependence: Secondary | ICD-10-CM

## 2021-06-14 DIAGNOSIS — F17210 Nicotine dependence, cigarettes, uncomplicated: Secondary | ICD-10-CM

## 2021-06-14 NOTE — Telephone Encounter (Signed)
Contacted patient regarding CT results.  Recommendation to repeat LDCT in 12 months with lung nodules resolved or benign in appearance.  CAD and emphysema, as previously noted.  Will route results to PCP.  Patient already has cardiology care also.  Nothing further needed.  Patient verbalized understanding.

## 2021-06-18 ENCOUNTER — Other Ambulatory Visit: Payer: Self-pay | Admitting: Cardiovascular Disease

## 2021-07-25 ENCOUNTER — Ambulatory Visit
Admission: RE | Admit: 2021-07-25 | Discharge: 2021-07-25 | Disposition: A | Discharge status: Home / Self Care | Payer: 59 | Source: Ambulatory Visit | Attending: Internal Medicine | Admitting: Internal Medicine

## 2021-07-25 ENCOUNTER — Other Ambulatory Visit: Payer: Self-pay | Admitting: Internal Medicine

## 2021-07-25 DIAGNOSIS — R051 Acute cough: Secondary | ICD-10-CM

## 2021-07-26 ENCOUNTER — Ambulatory Visit (INDEPENDENT_AMBULATORY_CARE_PROVIDER_SITE_OTHER): Payer: 59

## 2021-07-26 DIAGNOSIS — I442 Atrioventricular block, complete: Secondary | ICD-10-CM | POA: Diagnosis not present

## 2021-07-26 LAB — CUP PACEART REMOTE DEVICE CHECK
Battery Impedance: 3257 Ohm
Battery Remaining Longevity: 22 mo
Battery Voltage: 2.72 V
Brady Statistic AP VP Percent: 1 %
Brady Statistic AP VS Percent: 0 %
Brady Statistic AS VP Percent: 99 %
Brady Statistic AS VS Percent: 0 %
Date Time Interrogation Session: 20230120060318
Implantable Lead Implant Date: 20100924
Implantable Lead Implant Date: 20100924
Implantable Lead Location: 753859
Implantable Lead Location: 753860
Implantable Lead Model: 5076
Implantable Lead Model: 5076
Implantable Pulse Generator Implant Date: 20100924
Lead Channel Impedance Value: 509 Ohm
Lead Channel Impedance Value: 561 Ohm
Lead Channel Pacing Threshold Amplitude: 0.375 V
Lead Channel Pacing Threshold Amplitude: 0.625 V
Lead Channel Pacing Threshold Pulse Width: 0.4 ms
Lead Channel Pacing Threshold Pulse Width: 0.4 ms
Lead Channel Setting Pacing Amplitude: 1.5 V
Lead Channel Setting Pacing Amplitude: 2 V
Lead Channel Setting Pacing Pulse Width: 0.4 ms
Lead Channel Setting Sensing Sensitivity: 5.6 mV

## 2021-08-07 NOTE — Progress Notes (Signed)
Remote pacemaker transmission.   

## 2021-08-21 ENCOUNTER — Encounter: Payer: Self-pay | Admitting: Internal Medicine

## 2021-08-29 ENCOUNTER — Ambulatory Visit (AMBULATORY_SURGERY_CENTER): Payer: 59 | Admitting: *Deleted

## 2021-08-29 ENCOUNTER — Other Ambulatory Visit: Payer: Self-pay

## 2021-08-29 VITALS — Ht 66.5 in | Wt 197.0 lb

## 2021-08-29 DIAGNOSIS — Z1211 Encounter for screening for malignant neoplasm of colon: Secondary | ICD-10-CM

## 2021-08-29 MED ORDER — NA SULFATE-K SULFATE-MG SULF 17.5-3.13-1.6 GM/177ML PO SOLN: 1.0000 | ORAL | 0 refills | Status: DC

## 2021-08-29 NOTE — Progress Notes (Signed)

## 2021-09-05 ENCOUNTER — Encounter: Payer: Self-pay | Admitting: Internal Medicine

## 2021-09-12 ENCOUNTER — Ambulatory Visit (AMBULATORY_SURGERY_CENTER): Payer: 59 | Admitting: Internal Medicine

## 2021-09-12 ENCOUNTER — Encounter: Payer: Self-pay | Admitting: Internal Medicine

## 2021-09-12 VITALS — BP 125/76 | HR 85 | Temp 95.3°F | Resp 15 | Ht 66.5 in | Wt 197.0 lb

## 2021-09-12 DIAGNOSIS — D125 Benign neoplasm of sigmoid colon: Secondary | ICD-10-CM

## 2021-09-12 DIAGNOSIS — D124 Benign neoplasm of descending colon: Secondary | ICD-10-CM | POA: Diagnosis not present

## 2021-09-12 DIAGNOSIS — Z1211 Encounter for screening for malignant neoplasm of colon: Secondary | ICD-10-CM | POA: Diagnosis not present

## 2021-09-12 MED ORDER — SODIUM CHLORIDE 0.9 % IV SOLN: 500.0000 mL | INTRAVENOUS | Status: DC

## 2021-09-12 NOTE — Op Note (Signed)
[facility] ?Patient Name: Mandy Sellers ?Procedure Date: 09/12/2021 2:20 PM ?MRN: [id number] ?Endoscopist: Jerene Bears , MD ?Age: 61 ?Referring MD:  ?Date of Birth: 06-Jan-1961 ?Gender: Female ?Account #: [id number] ?Procedure:                Colonoscopy ?Indications:              Screening for colorectal malignant neoplasm, Last  ?                          colonoscopy: 2012 ?Medicines:                Monitored Anesthesia Care ?Procedure:                Pre-Anesthesia Assessment: ?                          - Prior to the procedure, a History and Physical  ?                          was performed, and patient medications and  ?                          allergies were reviewed. The patient's tolerance of  ?                          previous anesthesia was also reviewed. The risks  ?                          and benefits of the procedure and the sedation  ?                          options and risks were discussed with the patient.  ?                          All questions were answered, and informed consent  ?                          was obtained. Prior Anticoagulants: The patient has  ?                          taken no previous anticoagulant or antiplatelet  ?                          agents. ASA Grade Assessment: III - A patient with  ?                          severe systemic disease. After reviewing the risks  ?                          and benefits, the patient was deemed in  ?                          satisfactory condition to undergo the procedure. ?  After obtaining informed consent, the colonoscope  ?                          was passed under direct vision. Throughout the  ?                          procedure, the patient's blood pressure, pulse, and  ?                          oxygen saturations were monitored continuously. The  ?                          Olympus PCF-H190DL ([id number]) Colonoscope was  ?                          introduced through the anus and advanced  to the  ?                          cecum, identified by appendiceal orifice and  ?                          ileocecal valve. The colonoscopy was performed  ?                          without difficulty. The patient tolerated the  ?                          procedure well. The quality of the bowel  ?                          preparation was good. The ileocecal valve,  ?                          appendiceal orifice, and rectum were photographed. ?Scope In: 2:34:16 PM ?Scope Out: 2:48:32 PM ?Scope Withdrawal Time: 0 hours 11 minutes 56 seconds  ?Total Procedure Duration: 0 hours 14 minutes 16 seconds  ?Findings:                 The digital rectal exam was normal. ?                          A 6 mm polyp was found in the descending colon. The  ?                          polyp was sessile. The polyp was removed with a  ?                          cold snare. Resection and retrieval were complete. ?                          A 3 mm polyp was found in the sigmoid colon. The  ?                          polyp was sessile. The  polyp was removed with a  ?                          cold snare. Resection was complete, but the polyp  ?                          tissue was not retrieved. ?                          Multiple small-mouthed diverticula were found in  ?                          the sigmoid colon, descending colon, ascending  ?                          colon and cecum. ?                          The retroflexed view of the distal rectum and anal  ?                          verge was normal and showed no anal or rectal  ?                          abnormalities. ?Complications:            No immediate complications. ?Estimated Blood Loss:     Estimated blood loss was minimal. ?Impression:               - One 6 mm polyp in the descending colon, removed  ?                          with a cold snare. Resected and retrieved. ?                          - One 3 mm polyp in the sigmoid colon, removed with  ?                           a cold snare. Complete resection. Polyp tissue not  ?                          retrieved. ?                          - Diverticulosis in the sigmoid colon, in the  ?                          descending colon, in the ascending colon and in the  ?                          cecum. ?                          - The distal rectum and anal verge are normal on  ?  retroflexion view. ?Recommendation:           - Patient has a contact number available for  ?                          emergencies. The signs and symptoms of potential  ?                          delayed complications were discussed with the  ?                          patient. Return to normal activities tomorrow.  ?                          Written discharge instructions were provided to the  ?                          patient. ?                          - Resume previous diet. ?                          - Continue present medications. ?                          - Await pathology results. ?                          - Repeat colonoscopy is recommended. The  ?                          colonoscopy date will be determined after pathology  ?                          results from today's exam become available for  ?                          review. ?Jerene Bears, MD ?09/12/2021 2:52:01 PM ?This report has been signed electronically. ?

## 2021-09-12 NOTE — Patient Instructions (Signed)
Read all of the handouts given to you by your recovery room nurse. ? ?YOU HAD AN ENDOSCOPIC PROCEDURE TODAY AT THE [facility]:   Refer to the procedure report that was given to you for any specific questions about what was found during the examination.  If the procedure report does not answer your questions, please call your gastroenterologist to clarify.  If you requested that your care partner not be given the details of your procedure findings, then the procedure report has been included in a sealed envelope for you to review at your convenience later. ? ?YOU SHOULD EXPECT: Some feelings of bloating in the abdomen. Passage of more gas than usual.  Walking can help get rid of the air that was put into your GI tract during the procedure and reduce the bloating. If you had a lower endoscopy (such as a colonoscopy or flexible sigmoidoscopy) you may notice spotting of blood in your stool or on the toilet paper. If you underwent a bowel prep for your procedure, you may not have a normal bowel movement for a few days. ? ?Please Note:  You might notice some irritation and congestion in your nose or some drainage.  This is from the oxygen used during your procedure.  There is no need for concern and it should clear up in a day or so. ? ?SYMPTOMS TO REPORT IMMEDIATELY: ? ?Following lower endoscopy (colonoscopy or flexible sigmoidoscopy): ? Excessive amounts of blood in the stool ? Significant tenderness or worsening of abdominal pains ? Swelling of the abdomen that is new, acute ? Fever of 100?F or higher ? ?  ?For urgent or emergent issues, a gastroenterologist can be reached at any hour by calling [phone]. ?Do not use MyChart messaging for urgent concerns.  ? ? ?DIET:  We do recommend a small meal at first, but then you may proceed to your regular diet.  Drink plenty of fluids but you should avoid alcoholic beverages for 24 hours. ? ?ACTIVITY:  You should plan to take it easy for the rest of today  and you should NOT DRIVE or use heavy machinery until tomorrow (because of the sedation medicines used during the test).   ? ?FOLLOW UP: ?Our staff will call the number listed on your records 48-72 hours following your procedure to check on you and address any questions or concerns that you may have regarding the information given to you following your procedure. If we do not reach you, we will leave a message.  We will attempt to reach you two times.  During this call, we will ask if you have developed any symptoms of COVID 19. If you develop any symptoms (ie: fever, flu-like symptoms, shortness of breath, cough etc.) before then, please call [phone].  If you test positive for Covid 19 in the 2 weeks post procedure, please call and report this information to us.   ? ?If any biopsies were taken you will be contacted by phone or by letter within the next 1-3 weeks.  Please call us at [phone] if you have not heard about the biopsies in 3 weeks.  ? ? ?SIGNATURES/CONFIDENTIALITY: ?You and/or your care partner have signed paperwork which will be entered into your electronic medical record.  These signatures attest to the fact that that the information above on your After Visit Summary has been reviewed and is understood.  Full responsibility of the confidentiality of this discharge information lies with you and/or your care-partner.  ?

## 2021-09-12 NOTE — Progress Notes (Signed)
Pt's states no medical or surgical changes since previsit or office visit. 

## 2021-09-12 NOTE — Progress Notes (Signed)
? ?GASTROENTEROLOGY PROCEDURE H&P NOTE  ? ?Primary Care Physician: ?Lavone Orn, MD ? ? ? ?Reason for Procedure:  Colon cancer screening ? ?Plan:    Colonoscopy ? ?Patient is appropriate for endoscopic procedure(s) in the ambulatory ([facility]) setting. ? ?The nature of the procedure, as well as the risks, benefits, and alternatives were carefully and thoroughly reviewed with the patient. Ample time for discussion and questions allowed. The patient understood, was satisfied, and agreed to proceed.  ? ? ? ?HPI: ?Mandy Sellers is a 61 y.o. female who presents for colonoscopy.  Medical history as below.  Tolerated the prep.  No recent chest pain or shortness of breath.  No abdominal pain today. ? ?Past Medical History:  ?Diagnosis Date  ? CHB (complete heart block) ([place]) 08/09/2013  ? COPD (chronic obstructive pulmonary disease) ([place])   ? Depression   ? Diabetes mellitus without complication ([place])   ? HTN (hypertension) 08/09/2013  ? Hypertension   ? Obesity (BMI 30.0-34.9) 08/09/2013  ? Pacemaker   ? dual-chmber for CHB in 2010  ? Tobacco abuse   ? ? ?Past Surgical History:  ?Procedure Laterality Date  ? COLONOSCOPY  2012  ? Dr.Brodie  ? DILATATION & CURRETTAGE/HYSTEROSCOPY WITH RESECTOCOPE N/A 11/10/2013  ? Procedure: [place];  Surgeon: Darlyn Chamber, MD;  Location: [facility];  Service: Gynecology;  Laterality: N/A;  ? INCONTINENCE SURGERY  03/07/2000  ? INSERT / REPLACE / REMOVE PACEMAKER    ? LUMBAR SPINE SURGERY  2021  ? NM MYOCAR PERF WALL MOTION  05/07/2009  ? bruce myoview; normal pattern of perfusion in all regions, low risk scan  ? PACEMAKER INSERTION  03/30/2009  ? for CHB; Medtronic Adapta ADDRL1 - Dr. Norlene Duel  ? TRANSTHORACIC ECHOCARDIOGRAM  09/05/2010  ? EF=>55%; mild MR; trace pulm valve regurg   ? TUBAL LIGATION  03/07/2000  ? ? ?Prior to Admission medications   ?Medication Sig Start Date End Date Taking? Authorizing Provider  ?aspirin EC 81 MG tablet Take 81  mg by mouth at bedtime.   Yes [provider]  ?Cholecalciferol (VITAMIN D-3) 1000 UNITS CAPS Take 1,000 Units by mouth daily.   Yes [provider]  ?hydrochlorothiazide (MICROZIDE) 12.5 MG capsule TAKE 1 CAPSULE BY MOUTH  DAILY 03/27/21  Yes Croitoru, Mihai, MD  ?metFORMIN (GLUCOPHAGE) 1000 MG tablet Take 1,000 mg by mouth 2 (two) times daily with a meal.   Yes [provider]  ?metoprolol succinate (TOPROL-XL) 50 MG 24 hr tablet TAKE 1 TABLET BY MOUTH  DAILY WITH OR IMMEDIATELY  FOLLOWING A MEAL 06/18/21  Yes Croitoru, Mihai, MD  ?Multiple Vitamin (MULTIVITAMIN WITH MINERALS) TABS tablet Take 1 tablet by mouth daily.   Yes [provider]  ?Na Sulfate-K Sulfate-Mg Sulf 17.5-3.13-1.6 GM/177ML SOLN Take 1 kit by mouth as directed. May use generic Suprep 08/29/21  Yes Henleigh Robello, Lajuan Lines, MD  ?Omega-3 Fatty Acids (FISH OIL) 1000 MG CAPS Take 2,000 mg by mouth daily.   Yes [provider]  ?venlafaxine XR (EFFEXOR-XR) 75 MG 24 hr capsule Take 75 mg by mouth every evening.   Yes [provider]  ?albuterol (VENTOLIN HFA) 108 (90 Base) MCG/ACT inhaler Inhale 1-2 puffs into the lungs every 4 (four) hours as needed for wheezing or shortness of breath. 05/06/21   Croitoru, Dani Gobble, MD  ? ? ?Current Outpatient Medications  ?Medication Sig Dispense Refill  ? aspirin EC 81 MG tablet Take 81 mg by mouth at bedtime.    ?  Cholecalciferol (VITAMIN D-3) 1000 UNITS CAPS Take 1,000 Units by mouth daily.    ? hydrochlorothiazide (MICROZIDE) 12.5 MG capsule TAKE 1 CAPSULE BY MOUTH  DAILY 90 capsule 3  ? metFORMIN (GLUCOPHAGE) 1000 MG tablet Take 1,000 mg by mouth 2 (two) times daily with a meal.    ? metoprolol succinate (TOPROL-XL) 50 MG 24 hr tablet TAKE 1 TABLET BY MOUTH  DAILY WITH OR IMMEDIATELY  FOLLOWING A MEAL 90 tablet 3  ? Multiple Vitamin (MULTIVITAMIN WITH MINERALS) TABS tablet Take 1 tablet by mouth daily.    ? Na Sulfate-K Sulfate-Mg Sulf 17.5-3.13-1.6 GM/177ML SOLN Take 1 kit  by mouth as directed. May use generic Suprep 354 mL 0  ? Omega-3 Fatty Acids (FISH OIL) 1000 MG CAPS Take 2,000 mg by mouth daily.    ? venlafaxine XR (EFFEXOR-XR) 75 MG 24 hr capsule Take 75 mg by mouth every evening.    ? albuterol (VENTOLIN HFA) 108 (90 Base) MCG/ACT inhaler Inhale 1-2 puffs into the lungs every 4 (four) hours as needed for wheezing or shortness of breath. 8 g 6  ? ?Current Facility-Administered Medications  ?Medication Dose Route Frequency Provider Last Rate Last Admin  ? 0.9 %  sodium chloride infusion  500 mL Intravenous Continuous Cassadi Purdie, Lajuan Lines, MD      ? ? ?Allergies as of 09/12/2021 - Review Complete 09/12/2021  ?Allergen Reaction Noted  ? Amoxicillin Itching 01/17/2021  ? ? ?Family History  ?Problem Relation Age of Onset  ? Diabetes Mother   ? Diabetes Father   ? Alcohol abuse Father   ? Hypertension Father   ? Stroke Father   ? Deep vein thrombosis Brother   ? Colon cancer Neg Hx   ? Colon polyps Neg Hx   ? Esophageal cancer Neg Hx   ? Rectal cancer Neg Hx   ? Stomach cancer Neg Hx   ? ? ?Social History  ? ?Socioeconomic History  ? Marital status: Widowed  ?  Spouse name: Not on file  ? Number of children: 1  ? Years of education: master's  ? Highest education level: Not on file  ?Occupational History  ?  Employer: [employer]  ?Tobacco Use  ? Smoking status: Heavy Smoker  ?  Packs/day: 1.50  ?  Years: 44.00  ?  Pack years: 66.00  ?  Types: Cigarettes  ? Smokeless tobacco: Never  ?Vaping Use  ? Vaping Use: Never used  ?Substance and Sexual Activity  ? Alcohol use: Yes  ?  Alcohol/week: 3.0 standard drinks  ?  Types: 3 Standard drinks or equivalent per week  ?  Comment: social  ? Drug use: No  ? Sexual activity: Yes  ?Other Topics Concern  ? Not on file  ?Social History Narrative  ? Not on file  ? ?Social Determinants of Health  ? ?Financial Resource Strain: Not on file  ?Food Insecurity: Not on file  ?Transportation Needs: Not on file  ?Physical Activity: Not on file  ?Stress: Not on  file  ?Social Connections: Not on file  ?Intimate Partner Violence: Not on file  ? ? ?Physical Exam: ?Vital signs in last 24 hours: ?@BP  110/78   Pulse (!) 103   Temp (!) 95.3 ?F (35.2 ?C) (Temporal)   Resp 15   Ht 5' 6.5" (1.689 m)   Wt 197 lb (89.4 kg)   LMP 06/15/2013   SpO2 96%   BMI 31.32 kg/m?  ?GEN: NAD ?EYE: Sclerae anicteric ?ENT: MMM ?CV: Non-tachycardic ?Pulm: CTA  b/l ?GI: Soft, NT/ND ?NEURO:  Alert & Oriented x 3 ? ? ?Zenovia Jarred, MD ?[facility] ? ?09/12/2021 2:28 PM ? ?

## 2021-09-12 NOTE — Progress Notes (Signed)
Called to room to assist during endoscopic procedure.  Patient ID and intended procedure confirmed with present staff. Received instructions for my participation in the procedure from the performing physician.  

## 2021-09-12 NOTE — Progress Notes (Signed)
Report to PACU, RN, vss, BBS= Clear.  

## 2021-09-16 ENCOUNTER — Telehealth: Payer: Self-pay

## 2021-09-16 NOTE — Telephone Encounter (Signed)
?  Follow up Call- ? ?Call back number 09/12/2021 01/19/2020  ?Post procedure Call Back phone  # [phone]  ?Permission to leave phone message Yes -  ?Some recent data might be hidden  ?  ? ?Patient questions: ? ?Do you have a fever, pain , or abdominal swelling? No. ?Pain Score  0 * ? ?Have you tolerated food without any problems? Yes.   ? ?Have you been able to return to your normal activities? Yes.   ? ?Do you have any questions about your discharge instructions: ?Diet   No. ?Medications  No. ?Follow up visit  No. ? ?Do you have questions or concerns about your Care? No. ? ?Actions: ?* If pain score is 4 or above: ?No action needed, pain <4. ? ? ?

## 2021-09-17 ENCOUNTER — Encounter: Payer: Self-pay | Admitting: Internal Medicine

## 2021-09-19 ENCOUNTER — Encounter: Payer: Self-pay | Admitting: Internal Medicine

## 2021-10-25 ENCOUNTER — Ambulatory Visit (INDEPENDENT_AMBULATORY_CARE_PROVIDER_SITE_OTHER): Payer: 59

## 2021-10-25 DIAGNOSIS — I442 Atrioventricular block, complete: Secondary | ICD-10-CM

## 2021-10-29 ENCOUNTER — Telehealth: Payer: Self-pay | Admitting: Cardiovascular Disease

## 2021-10-29 LAB — CUP PACEART REMOTE DEVICE CHECK
Battery Impedance: 3329 Ohm
Battery Remaining Longevity: 20 mo
Battery Voltage: 2.71 V
Brady Statistic AP VP Percent: 1 %
Brady Statistic AP VS Percent: 0 %
Brady Statistic AS VP Percent: 99 %
Brady Statistic AS VS Percent: 0 %
Date Time Interrogation Session: 20230424152511
Implantable Lead Implant Date: 20100924
Implantable Lead Implant Date: 20100924
Implantable Lead Location: 753859
Implantable Lead Location: 753860
Implantable Lead Model: 5076
Implantable Lead Model: 5076
Implantable Pulse Generator Implant Date: 20100924
Lead Channel Impedance Value: 455 Ohm
Lead Channel Impedance Value: 460 Ohm
Lead Channel Pacing Threshold Amplitude: 0.375 V
Lead Channel Pacing Threshold Amplitude: 0.625 V
Lead Channel Pacing Threshold Pulse Width: 0.4 ms
Lead Channel Pacing Threshold Pulse Width: 0.4 ms
Lead Channel Setting Pacing Amplitude: 1.5 V
Lead Channel Setting Pacing Amplitude: 2 V
Lead Channel Setting Pacing Pulse Width: 0.4 ms
Lead Channel Setting Sensing Sensitivity: 5.6 mV

## 2021-10-29 NOTE — Telephone Encounter (Signed)
?*  STAT* If patient is at the pharmacy, call can be transferred to refill team. ? ? ?1. Which medications need to be refilled? (please list name of each medication and dose if known)  ? hydrochlorothiazide (MICROZIDE) 12.5 MG capsule  ? ? metoprolol succinate (TOPROL-XL) 50 MG 24 hr tablet  ? ? ?2. Which pharmacy/location (including street and city if local pharmacy) is medication to be sent to?  ?[facility] [place] to Registered [facility] Sites ?3. Do they need a 30 day or 90 day supply? 90 day ? ?

## 2021-10-30 ENCOUNTER — Other Ambulatory Visit: Payer: Self-pay

## 2021-10-30 MED ORDER — METOPROLOL SUCCINATE ER 50 MG PO TB24: ORAL_TABLET | ORAL | 3 refills | Status: DC

## 2021-10-30 MED ORDER — HYDROCHLOROTHIAZIDE 12.5 MG PO CAPS: 12.5000 mg | ORAL_CAPSULE | Freq: Every day | ORAL | 3 refills | Status: DC

## 2021-10-30 NOTE — Telephone Encounter (Signed)
Medications refilled and sent to the desired pharmacy.  ?

## 2021-11-11 NOTE — Progress Notes (Signed)
Remote pacemaker transmission.   

## 2022-01-24 ENCOUNTER — Ambulatory Visit (INDEPENDENT_AMBULATORY_CARE_PROVIDER_SITE_OTHER): Payer: 59

## 2022-01-24 DIAGNOSIS — I442 Atrioventricular block, complete: Secondary | ICD-10-CM

## 2022-01-25 LAB — CUP PACEART REMOTE DEVICE CHECK
Battery Impedance: 4242 Ohm
Battery Remaining Longevity: 15 mo
Battery Voltage: 2.69 V
Brady Statistic AP VP Percent: 1 %
Brady Statistic AP VS Percent: 0 %
Brady Statistic AS VP Percent: 99 %
Brady Statistic AS VS Percent: 0 %
Date Time Interrogation Session: 20230722062324
Implantable Lead Implant Date: 20100924
Implantable Lead Implant Date: 20100924
Implantable Lead Location: 753859
Implantable Lead Location: 753860
Implantable Lead Model: 5076
Implantable Lead Model: 5076
Implantable Pulse Generator Implant Date: 20100924
Lead Channel Impedance Value: 512 Ohm
Lead Channel Impedance Value: 583 Ohm
Lead Channel Pacing Threshold Amplitude: 0.375 V
Lead Channel Pacing Threshold Amplitude: 0.625 V
Lead Channel Pacing Threshold Pulse Width: 0.4 ms
Lead Channel Pacing Threshold Pulse Width: 0.4 ms
Lead Channel Setting Pacing Amplitude: 1.5 V
Lead Channel Setting Pacing Amplitude: 2 V
Lead Channel Setting Pacing Pulse Width: 0.4 ms
Lead Channel Setting Sensing Sensitivity: 5.6 mV

## 2022-02-10 DIAGNOSIS — Z01419 Encounter for gynecological examination (general) (routine) without abnormal findings: Secondary | ICD-10-CM | POA: Diagnosis not present

## 2022-02-10 DIAGNOSIS — Z1231 Encounter for screening mammogram for malignant neoplasm of breast: Secondary | ICD-10-CM | POA: Diagnosis not present

## 2022-02-10 DIAGNOSIS — Z6830 Body mass index (BMI) 30.0-30.9, adult: Secondary | ICD-10-CM | POA: Diagnosis not present

## 2022-02-10 DIAGNOSIS — R69 Illness, unspecified: Secondary | ICD-10-CM | POA: Diagnosis not present

## 2022-02-10 DIAGNOSIS — Z124 Encounter for screening for malignant neoplasm of cervix: Secondary | ICD-10-CM | POA: Diagnosis not present

## 2022-02-10 NOTE — Progress Notes (Signed)
Remote pacemaker transmission.   

## 2022-04-07 DIAGNOSIS — M5416 Radiculopathy, lumbar region: Secondary | ICD-10-CM | POA: Diagnosis not present

## 2022-04-07 DIAGNOSIS — M461 Sacroiliitis, not elsewhere classified: Secondary | ICD-10-CM | POA: Diagnosis not present

## 2022-04-16 DIAGNOSIS — M461 Sacroiliitis, not elsewhere classified: Secondary | ICD-10-CM | POA: Diagnosis not present

## 2022-04-25 ENCOUNTER — Ambulatory Visit (INDEPENDENT_AMBULATORY_CARE_PROVIDER_SITE_OTHER): Payer: 59

## 2022-04-25 DIAGNOSIS — I442 Atrioventricular block, complete: Secondary | ICD-10-CM

## 2022-04-25 LAB — CUP PACEART REMOTE DEVICE CHECK
Battery Impedance: 5455 Ohm
Battery Remaining Longevity: 7 mo
Battery Voltage: 2.64 V
Brady Statistic AP VP Percent: 1 %
Brady Statistic AP VS Percent: 0 %
Brady Statistic AS VP Percent: 99 %
Brady Statistic AS VS Percent: 0 %
Date Time Interrogation Session: 20231020082439
Implantable Lead Implant Date: 20100924
Implantable Lead Implant Date: 20100924
Implantable Lead Location: 753859
Implantable Lead Location: 753860
Implantable Lead Model: 5076
Implantable Lead Model: 5076
Implantable Pulse Generator Implant Date: 20100924
Lead Channel Impedance Value: 486 Ohm
Lead Channel Impedance Value: 594 Ohm
Lead Channel Pacing Threshold Amplitude: 0.5 V
Lead Channel Pacing Threshold Amplitude: 0.625 V
Lead Channel Pacing Threshold Pulse Width: 0.4 ms
Lead Channel Pacing Threshold Pulse Width: 0.4 ms
Lead Channel Setting Pacing Amplitude: 1.5 V
Lead Channel Setting Pacing Amplitude: 2 V
Lead Channel Setting Pacing Pulse Width: 0.4 ms
Lead Channel Setting Sensing Sensitivity: 4 mV

## 2022-05-01 NOTE — Progress Notes (Signed)
Remote pacemaker transmission.   

## 2022-05-12 ENCOUNTER — Encounter: Payer: Self-pay | Admitting: Cardiovascular Disease

## 2022-05-12 ENCOUNTER — Telehealth: Payer: Self-pay

## 2022-05-12 ENCOUNTER — Ambulatory Visit: Payer: 59 | Attending: Cardiovascular Disease | Admitting: Cardiovascular Disease

## 2022-05-12 VITALS — BP 122/74 | HR 71 | Ht 66.5 in | Wt 181.4 lb

## 2022-05-12 DIAGNOSIS — I442 Atrioventricular block, complete: Secondary | ICD-10-CM

## 2022-05-12 DIAGNOSIS — Z950 Presence of cardiac pacemaker: Secondary | ICD-10-CM | POA: Diagnosis not present

## 2022-05-12 DIAGNOSIS — F17200 Nicotine dependence, unspecified, uncomplicated: Secondary | ICD-10-CM

## 2022-05-12 DIAGNOSIS — E663 Overweight: Secondary | ICD-10-CM

## 2022-05-12 DIAGNOSIS — E782 Mixed hyperlipidemia: Secondary | ICD-10-CM

## 2022-05-12 DIAGNOSIS — I10 Essential (primary) hypertension: Secondary | ICD-10-CM

## 2022-05-12 DIAGNOSIS — R69 Illness, unspecified: Secondary | ICD-10-CM | POA: Diagnosis not present

## 2022-05-12 DIAGNOSIS — E119 Type 2 diabetes mellitus without complications: Secondary | ICD-10-CM | POA: Diagnosis not present

## 2022-05-12 NOTE — Progress Notes (Incomplete)
Patient ID: Mandy Sellers, female   DOB: 19-Feb-1961, 61 y.o.   MRN: [id number]    Cardiology Office Note    Date:  05/13/2022   ID:  Mandy Sellers, DOB [birth year], MRN [id number]  PCP:  Mandy Orn, MD  Cardiologist:   Mandy Klein, MD   Chief Complaint  Patient presents with   Pacemaker Check     History of Present Illness:  Mandy Sellers is a 61 y.o. female with complete heart block.  Her pacemaker (Medtronic Adapta, A&V leads Medtronic [id number], implanted 2010) is approaching elective replacement indicator.  She does have a ventricular escape rhythm at about 35-40 bpm.  Additional medical problems include hypertension, type 2 diabetes mellitus on metformin monotherapy and ongoing smoking.  Cardiovascular point of view she is asymptomatic.  She enjoys her 2 grandsons, ages 66 and 2.  She is still trying to quit smoking permanently.  She has lost 15 pounds since the spring and is now in overweight rather than obese range.  The patient specifically denies any chest pain at rest exertion, dyspnea at rest or with exertion, orthopnea, paroxysmal nocturnal dyspnea, syncope, palpitations, focal neurological deficits, intermittent claudication, lower extremity edema, unexplained weight gain, cough, hemoptysis or wheezing.  Other cardiac risk factors are mostly well addressed.  Her hemoglobin A1c is 6.2% on metformin monotherapy and her LDL cholesterol is 82 (no known CAD or PAD).  HDL is a little better than the last year at 67.  Unfortunately her triglycerides are high at 365.  Pacemaker function is normal, but she is approaching ERI (estimated generator longevity another 5 months).  She has 0.6% atrial pacing with good heart rate histogram distribution and 99.7% ventricular pacing.  She has not had any episodes of high ventricular rate.  She has had 3 very brief episodes of mode switch less than 1 minute in duration, no electrograms available for review.  She has never had atrial  fibrillation.  Her ECG shows atrial sensed, ventricular paced rhythm with a distinct positive R wave in lead V1.  The QRS is brought at 180 ms but with a atypical RBBB pattern.  QTc is 478 ms.    Past Medical History:  Diagnosis Date   CHB (complete heart block) ([place]) 08/09/2013   COPD (chronic obstructive pulmonary disease) ([place])    Depression    Diabetes mellitus without complication (HCC)    HTN (hypertension) 08/09/2013   Hypertension    Obesity (BMI 30.0-34.9) 08/09/2013   Pacemaker    dual-chmber for CHB in 2010   Tobacco abuse     Past Surgical History:  Procedure Laterality Date   COLONOSCOPY  2012   MandyBrodie   DILATATION & CURRETTAGE/HYSTEROSCOPY WITH RESECTOCOPE N/A 11/10/2013   Procedure: DILATATION & CURETTAGE/HYSTEROSCOPY WITH MYOSURE;  Surgeon: Mandy Chamber, MD;  Location: [facility];  Service: Gynecology;  Laterality: N/A;   INCONTINENCE SURGERY  03/07/2000   INSERT / REPLACE / REMOVE PACEMAKER     LUMBAR SPINE SURGERY  2021   NM MYOCAR PERF WALL MOTION  05/07/2009   bruce myoview; normal pattern of perfusion in all regions, low risk scan   PACEMAKER INSERTION  03/30/2009   for CHB; Medtronic Adapta ADDRL1 - Mandy Sellers ECHOCARDIOGRAM  09/05/2010   EF=>55%; mild MR; trace pulm valve regurg    TUBAL LIGATION  03/07/2000    Outpatient Medications Prior to Visit  Medication Sig Dispense Refill   albuterol (VENTOLIN HFA) 108 (90 Base) MCG/ACT inhaler Inhale  1-2 puffs into the lungs every 4 (four) hours as needed for wheezing or shortness of breath. 8 g 6   aspirin EC 81 MG tablet Take 81 mg by mouth at bedtime.     Cholecalciferol (VITAMIN D-3) 1000 UNITS CAPS Take 1,000 Units by mouth daily.     hydrochlorothiazide (MICROZIDE) 12.5 MG capsule Take 1 capsule (12.5 mg total) by mouth daily. 90 capsule 3   metFORMIN (GLUCOPHAGE) 1000 MG tablet Take 1,000 mg by mouth 2 (two) times daily with a meal.     metoprolol succinate (TOPROL-XL) 50 MG 24 hr  tablet Take with or immediately following a meal. 90 tablet 3   Multiple Vitamin (MULTIVITAMIN WITH MINERALS) TABS tablet Take 1 tablet by mouth daily.     Omega-3 Fatty Acids (FISH OIL) 1000 MG CAPS Take 2,000 mg by mouth daily.     venlafaxine XR (EFFEXOR-XR) 75 MG 24 hr capsule Take 75 mg by mouth every evening.     Na Sulfate-K Sulfate-Mg Sulf 17.5-3.13-1.6 GM/177ML SOLN Take 1 kit by mouth as directed. May use generic Suprep (Patient not taking: Reported on 05/12/2022) 354 mL 0   No facility-administered medications prior to visit.     Allergies:   Amoxicillin   Social History   Socioeconomic History   Marital status: Widowed    Spouse name: Not on file   Number of children: 1   Years of education: master's   Highest education level: Not on file  Occupational History    Employer: [employer]  Tobacco Use   Smoking status: Heavy Smoker    Packs/day: 1.50    Years: 44.00    Total pack years: 66.00    Types: Cigarettes   Smokeless tobacco: Never  Vaping Use   Vaping Use: Never used  Substance and Sexual Activity   Alcohol use: Yes    Alcohol/week: 3.0 standard drinks of alcohol    Types: 3 Standard drinks or equivalent per week    Comment: social   Drug use: No   Sexual activity: Yes  Other Topics Concern   Not on file  Social History Narrative   Not on file   Social Determinants of Health   Financial Resource Strain: Not on file  Food Insecurity: Not on file  Transportation Needs: Not on file  Physical Activity: Not on file  Stress: Not on file  Social Connections: Not on file     Family History:  The patient's family history includes Alcohol abuse in her father; Deep vein thrombosis in her brother; Diabetes in her father and mother; Hypertension in her father; Stroke in her father.   ROS:   Please see the history of present illness.    ROS  All other systems are reviewed and are negative.Marland Kitchen  PHYSICAL EXAM:   VS:  BP 122/74 (BP Location: Left Arm,  Patient Position: Sitting, Cuff Size: Large)   Pulse 71   Ht 5' 6.5" (1.689 m)   Wt 82.3 kg   LMP 06/15/2013   SpO2 98%   BMI 28.84 kg/m      General: Alert, oriented x3, no distress, healthy left subclavian pacemaker site Head: no evidence of trauma, PERRL, EOMI, no exophtalmos or lid lag, no myxedema, no xanthelasma; normal ears, nose and oropharynx Neck: normal jugular venous pulsations and no hepatojugular reflux; brisk carotid pulses without delay and no carotid bruits Chest: clear to auscultation, no signs of consolidation by percussion or palpation, normal fremitus, symmetrical and full respiratory excursions Cardiovascular: normal  position and quality of the apical impulse, regular rhythm, normal first and second heart sounds, no murmurs, rubs or gallops Abdomen: no tenderness or distention, no masses by palpation, no abnormal pulsatility or arterial bruits, normal bowel sounds, no hepatosplenomegaly Extremities: no clubbing, cyanosis or edema; 2+ radial, ulnar and brachial pulses bilaterally; 2+ right femoral, posterior tibial and dorsalis pedis pulses; 2+ left femoral, posterior tibial and dorsalis pedis pulses; no subclavian or femoral bruits Neurological: grossly nonfocal Psych: Normal mood and affect    Wt Readings from Last 3 Encounters:  05/12/22 82.3 kg  09/12/21 89.4 kg  08/29/21 89.4 kg      Studies/Labs Reviewed:   EKG: Shows atrial sensed, ventricular paced rhythm.  She has a positive R wave in lead V1.  Her CT shows RV lead in middle cardiac vein) Recent Labs:  08/02/2021 Hemoglobin 14.5, creatinine 0.72, potassium 4.3, ALT 42  08/14/2021 Cholesterol 188, HDL 46, LDL 82, triglycerides 365  02/14/2022 Hemoglobin A1c 6.2%  ASSESSMENT:    1. CHB (complete heart block) (HCC)   2. Pacemaker   3. Essential hypertension   4. Overweight   5. Type 2 diabetes mellitus without complication, without long-term current use of insulin ([place])   6. Mixed  hyperlipidemia   7. Smoking      PLAN:  In order of problems listed above:  CHB: Although she does have a very slow ventricular escape rhythm, she tolerates lead threshold testing very poorly.  She should be considered pacemaker dependent. PPM: Normal device function with estimated generator longevity of another 5 months.  We will start doing monthly pacemaker downloads.  Note that the CT shows that her right ventricular pacemaker lead is actually located in the middle cardiac vein.  Both the atrial and ventricular leads are MRI conditional but her Medtronic Adapta generator is not.  When she gets a new generator in a couple of years her whole system should be MRI conditional.  Recommend Aigis pouch when we do the generator change out. HTN: Well-controlled.  She has lost quite a bit of weight.  We can try to discontinue her hydrochlorothiazide.  If her blood pressure is still in normal range after 2 weeks we will take it off her medication list altogether. Overweight: Congratulated on losing weight. DM: Good glycemic control on metformin monotherapy.Marland Kitchen HLP: Lipid parameters are mostly in target range with the exception of the triglycerides.  Discussed the concept of the glycemic index, limiting overall carb intake and preferring carbs with lots of fiber. Smoking: Encouraged her and her efforts to eventually quit smoking altogether.   Medication Adjustments/Labs and Tests Ordered: Current medicines are reviewed at length with the patient today.  Concerns regarding medicines are outlined above.  Medication changes, Labs and Tests ordered today are listed in the Patient Instructions below. Patient Instructions  Medication Instructions:  Your physician recommends that you continue on your current medications as directed. Please refer to the Current Medication list given to you today.  *If you need a refill on your cardiac medications before your next appointment, please call your  pharmacy*   Follow-Up: At [facility], you and your health needs are our priority.  As part of our continuing mission to provide you with exceptional heart care, we have created designated Provider Care Teams.  These Care Teams include your primary Cardiologist (physician) and Advanced Practice Providers (APPs -  Physician Assistants and Nurse Practitioners) who all work together to provide you with the care you need, when you need  it.  We recommend signing up for the patient portal called "MyChart".  Sign up information is provided on this After Visit Summary.  MyChart is used to connect with patients for Virtual Visits (Telemedicine).  Patients are able to view lab/test results, encounter notes, upcoming appointments, etc.  Non-urgent messages can be sent to your provider as well.   To learn more about what you can do with MyChart, go to [email or web address].    Your next appointment:   6 month(s)  The format for your next appointment:   In Person  Provider:   Sanda Klein, MD         Signed, Mandy Klein, MD  05/13/2022 8:49 AM    [address], [place], [place]  [address] Phone: [phone]; Fax: [phone]

## 2022-05-12 NOTE — Telephone Encounter (Signed)
Remotes scheduled in EPIC for monthly battery checks. Attempted to contact patient to advise of dates (manual send), no answer. LMTCB.

## 2022-05-12 NOTE — Telephone Encounter (Signed)
-----   Message from Beatrix Fetters, RN sent at 05/12/2022  9:35 AM EST ----- Regarding: Downloads Good morning,  Dr. Loletha Grayer would like to do monthly downloads for this pt to keep an eye on her battery status.  Thank you so much!  Rexanne Mano

## 2022-05-12 NOTE — Patient Instructions (Signed)
Medication Instructions:  Your physician recommends that you continue on your current medications as directed. Please refer to the Current Medication list given to you today.  *If you need a refill on your cardiac medications before your next appointment, please call your pharmacy*   Follow-Up: At [facility], you and your health needs are our priority.  As part of our continuing mission to provide you with exceptional heart care, we have created designated Provider Care Teams.  These Care Teams include your primary Cardiologist (physician) and Advanced Practice Providers (APPs -  Physician Assistants and Nurse Practitioners) who all work together to provide you with the care you need, when you need it.  We recommend signing up for the patient portal called "MyChart".  Sign up information is provided on this After Visit Summary.  MyChart is used to connect with patients for Virtual Visits (Telemedicine).  Patients are able to view lab/test results, encounter notes, upcoming appointments, etc.  Non-urgent messages can be sent to your provider as well.   To learn more about what you can do with MyChart, go to [email or web address].    Your next appointment:   6 month(s)  The format for your next appointment:   In Person  Provider:   Mihai Croitoru, MD 

## 2022-05-13 ENCOUNTER — Encounter: Payer: Self-pay | Admitting: Cardiovascular Disease

## 2022-05-13 NOTE — Telephone Encounter (Signed)
I spoke with the patient and let her know that we now have her on Monthly battery checks.

## 2022-05-22 DIAGNOSIS — M5416 Radiculopathy, lumbar region: Secondary | ICD-10-CM | POA: Diagnosis not present

## 2022-05-22 DIAGNOSIS — M461 Sacroiliitis, not elsewhere classified: Secondary | ICD-10-CM | POA: Diagnosis not present

## 2022-05-22 DIAGNOSIS — Z6832 Body mass index (BMI) 32.0-32.9, adult: Secondary | ICD-10-CM | POA: Diagnosis not present

## 2022-06-10 ENCOUNTER — Ambulatory Visit
Admission: RE | Admit: 2022-06-10 | Discharge: 2022-06-10 | Disposition: A | Discharge status: Home / Self Care | Payer: 59 | Source: Ambulatory Visit | Attending: Internal Medicine | Admitting: Internal Medicine

## 2022-06-10 DIAGNOSIS — J432 Centrilobular emphysema: Secondary | ICD-10-CM | POA: Diagnosis not present

## 2022-06-10 DIAGNOSIS — F17210 Nicotine dependence, cigarettes, uncomplicated: Secondary | ICD-10-CM

## 2022-06-10 DIAGNOSIS — J180 Bronchopneumonia, unspecified organism: Secondary | ICD-10-CM | POA: Diagnosis not present

## 2022-06-10 DIAGNOSIS — R69 Illness, unspecified: Secondary | ICD-10-CM | POA: Diagnosis not present

## 2022-06-10 DIAGNOSIS — Z87891 Personal history of nicotine dependence: Secondary | ICD-10-CM

## 2022-06-10 DIAGNOSIS — I2510 Atherosclerotic heart disease of native coronary artery without angina pectoris: Secondary | ICD-10-CM | POA: Diagnosis not present

## 2022-06-11 ENCOUNTER — Other Ambulatory Visit: Payer: Self-pay | Admitting: Acute Care

## 2022-06-11 DIAGNOSIS — Z122 Encounter for screening for malignant neoplasm of respiratory organs: Secondary | ICD-10-CM

## 2022-06-11 DIAGNOSIS — F17210 Nicotine dependence, cigarettes, uncomplicated: Secondary | ICD-10-CM

## 2022-06-11 DIAGNOSIS — Z87891 Personal history of nicotine dependence: Secondary | ICD-10-CM

## 2022-06-12 ENCOUNTER — Ambulatory Visit (INDEPENDENT_AMBULATORY_CARE_PROVIDER_SITE_OTHER): Payer: 59

## 2022-06-12 DIAGNOSIS — I442 Atrioventricular block, complete: Secondary | ICD-10-CM

## 2022-06-13 ENCOUNTER — Telehealth (INDEPENDENT_AMBULATORY_CARE_PROVIDER_SITE_OTHER): Payer: 59

## 2022-06-13 ENCOUNTER — Encounter: Payer: Self-pay | Admitting: Cardiovascular Disease

## 2022-06-13 ENCOUNTER — Telehealth: Payer: Self-pay

## 2022-06-13 DIAGNOSIS — I442 Atrioventricular block, complete: Secondary | ICD-10-CM

## 2022-06-13 DIAGNOSIS — Z950 Presence of cardiac pacemaker: Secondary | ICD-10-CM

## 2022-06-13 DIAGNOSIS — Z45010 Encounter for checking and testing of cardiac pacemaker pulse generator [battery]: Secondary | ICD-10-CM

## 2022-06-13 LAB — CUP PACEART REMOTE DEVICE CHECK
Battery Impedance: 7121 Ohm
Battery Remaining Longevity: 1 mo — CL
Battery Voltage: 2.61 V
Brady Statistic AP VP Percent: 0 %
Brady Statistic AP VS Percent: 0 %
Brady Statistic AS VP Percent: 100 %
Brady Statistic AS VS Percent: 0 %
Date Time Interrogation Session: 20231208043431
Implantable Lead Connection Status: 753985
Implantable Lead Connection Status: 753985
Implantable Lead Implant Date: 20100924
Implantable Lead Implant Date: 20100924
Implantable Lead Location: 753859
Implantable Lead Location: 753860
Implantable Lead Model: 5076
Implantable Lead Model: 5076
Implantable Pulse Generator Implant Date: 20100924
Lead Channel Impedance Value: 493 Ohm
Lead Channel Impedance Value: 578 Ohm
Lead Channel Pacing Threshold Amplitude: 0.5 V
Lead Channel Pacing Threshold Amplitude: 0.625 V
Lead Channel Pacing Threshold Pulse Width: 0.4 ms
Lead Channel Pacing Threshold Pulse Width: 0.4 ms
Lead Channel Setting Pacing Amplitude: 1.5 V
Lead Channel Setting Pacing Amplitude: 2 V
Lead Channel Setting Pacing Pulse Width: 0.4 ms
Lead Channel Setting Sensing Sensitivity: 4 mV
Zone Setting Status: 755011
Zone Setting Status: 755011

## 2022-06-13 NOTE — Telephone Encounter (Signed)
Received call from Dr.Croitoru to give surgical scrub and order blood work for upcoming procedure on Monday 06/16/22.   CBC, BMET ordered. Surgical Scrub placed in triage for patient to pick up.

## 2022-06-13 NOTE — Telephone Encounter (Signed)
Thanks.  Will be swapping her out this Monday, December 11.

## 2022-06-13 NOTE — Telephone Encounter (Signed)
Following alert received from [employer] received for Battery estimated < 37mo 10/20 battery estimated 757mo Route for rapid decline, VP=100%.   Spoke with [employer] they advised ERI is 2.59V. Patient is currently at 2.61V. Advised the battery is 1325ears old so this is a good longevity for an Adapta. Spoke to [employer] he advised we are still guaranteed 3 months after ERI is reached.   Forwarding to Dr. CrSallyanne Kusteror review.

## 2022-06-13 NOTE — Telephone Encounter (Signed)
Virtual Visit via Telephone Note   Because of Mandy Sellers's co-morbid illnesses, she is at least at moderate risk for complications without adequate follow up.  This format is felt to be most appropriate for this patient at this time.  The patient did not have access to video technology/had technical difficulties with video requiring transitioning to audio format only (telephone).  All issues noted in this document were discussed and addressed.  No physical exam could be performed with this format.  Please refer to the patient's chart for her consent to telehealth for Mandy Sellers.    Date:  06/13/2022   ID:  Mandy Sellers, DOB 03/07/1961, MRN [id number] The patient was identified using 2 identifiers.  Patient Location: Home Provider Location: Office/Clinic   PCP:  Charlane Ferretti, MD   [place] [facility] Cardiologist:  Sanda Klein, MD     Evaluation Performed:  Follow-Up Visit  Chief Complaint:  Pacemaker battery depletion  History of Present Illness:    Mandy Sellers is a 61 y.o. female with complete heart block, pacemaker dependent since 2010.  She has a dual-chamber Medtronic Adapta device that reported anticipated generator longevity of 15 months in July 2023, but rapidly down to 7 months in October 2023, 5 months at the office appointment in November 2023 and now with ERI anticipated in less than 1 month by a download earlier today.  She is asymptomatic.  Denies any problems with palpitations, dizziness, weakness, syncope or excessive fatigue.  Other than the rapid battery depletion the pacemaker function is normal.  At her last pacemaker check she did have an idioventricular escape rhythm at about 35 bpm.  Her ECG shows very prominent positive R wave in the septal leads and by CT her right ventricular lead is seen to be located in the middle cardiac vein.  Additional medical problems include hypertension, type 2 diabetes mellitus, tobacco  abuse.  Past Medical History:  Diagnosis Date   CHB (complete heart block) ([place]) 08/09/2013   COPD (chronic obstructive pulmonary disease) ([place])    Depression    Diabetes mellitus without complication (HCC)    HTN (hypertension) 08/09/2013   Hypertension    Obesity (BMI 30.0-34.9) 08/09/2013   Pacemaker    dual-chmber for CHB in 2010   Tobacco abuse    Past Surgical History:  Procedure Laterality Date   COLONOSCOPY  2012   Dr.Brodie   DILATATION & CURRETTAGE/HYSTEROSCOPY WITH RESECTOCOPE N/A 11/10/2013   Procedure: DILATATION & CURETTAGE/HYSTEROSCOPY WITH MYOSURE;  Surgeon: Darlyn Chamber, MD;  Location: [facility];  Service: Gynecology;  Laterality: N/A;   INCONTINENCE SURGERY  03/07/2000   INSERT / REPLACE / REMOVE PACEMAKER     LUMBAR SPINE SURGERY  2021   NM MYOCAR PERF WALL MOTION  05/07/2009   bruce myoview; normal pattern of perfusion in all regions, low risk scan   PACEMAKER INSERTION  03/30/2009   for CHB; Medtronic Adapta ADDRL1 - Dr. [place] ECHOCARDIOGRAM  09/05/2010   EF=>55%; mild MR; trace pulm valve regurg    TUBAL LIGATION  03/07/2000     No outpatient medications have been marked as taking for the 06/13/22 encounter (Telephone) with Mandy Sellers, Mandy Sellers.     Allergies:   Amoxicillin   Social History   Tobacco Use   Smoking status: Heavy Smoker    Packs/day: 1.50    Years: 44.00    Total pack years: 66.00    Types: Cigarettes  Smokeless tobacco: Never  Vaping Use   Vaping Use: Never used  Substance Use Topics   Alcohol use: Yes    Alcohol/week: 3.0 standard drinks of alcohol    Types: 3 Standard drinks or equivalent per week    Comment: social   Drug use: No     Family Hx: The patient's family history includes Alcohol abuse in her father; Deep vein thrombosis in her brother; Diabetes in her father and mother; Hypertension in her father; Stroke in her father. There is no history of Colon cancer, Colon polyps, Esophageal cancer,  Rectal cancer, or Stomach cancer.  ROS:   Please see the history of present illness.     All other systems reviewed and are negative.   Prior CV studies:   The following studies were reviewed today:  Pacemaker alert today showing device with less than 1 month until RRT  Labs/Other Tests and Data Reviewed:    EKG:  An ECG dated 05/12/2022 was personally reviewed today and demonstrated:  Atrial sensed (sinus), ventricular paced rhythm with positive R wave in lead V1  Recent Labs: No results found for requested labs within last 365 days.   Recent Lipid Panel Lab Results  Component Value Date/Time   CHOL  03/29/2009 07:30 AM    144        ATP III CLASSIFICATION:  <200     mg/dL   Desirable  200-239  mg/dL   Borderline High  >=240    mg/dL   High          TRIG 123 03/29/2009 07:30 AM   HDL 50 03/29/2009 07:30 AM   CHOLHDL 2.9 03/29/2009 07:30 AM   LDLCALC  03/29/2009 07:30 AM    69        Total Cholesterol/HDL:CHD Risk Coronary Heart Disease Risk Table                     Men   Women  1/2 Average Risk   3.4   3.3  Average Risk       5.0   4.4  2 X Average Risk   9.6   7.1  3 X Average Risk  23.4   11.0        Use the calculated Patient Ratio above and the CHD Risk Table to determine the patient's CHD Risk.        ATP III CLASSIFICATION (LDL):  <100     mg/dL   Optimal  100-129  mg/dL   Near or Above                    Optimal  130-159  mg/dL   Borderline  160-189  mg/dL   High  >190     mg/dL   Very High    Wt Readings from Last 3 Encounters:  05/12/22 82.3 kg  09/12/21 89.4 kg  08/29/21 89.4 kg     Risk Assessment/Calculations:          Objective:    Vital Signs:  '@VS'$ @   VITAL SIGNS:  reviewed  ASSESSMENT & PLAN:    Pacemaker dependent patient with complete heart block and unexpectedly rapid battery depletion this year, anticipate RRT in less than a month.  Will bring her in next week for pacemaker generator change out.  Discussed the procedure in  detail today including possible complications, in particular risk of infection.  Will benefit from an Aigis pouch. This procedure has been fully reviewed with  the patient and written informed consent has been obtained.        Time:   Today, I have spent 25 minutes with the patient with telehealth technology discussing the above problems.     Medication Adjustments/Labs and Tests Ordered: Current medicines are reviewed at length with the patient today.  Concerns regarding medicines are outlined above.   Tests Ordered: Orders Placed This Encounter  Procedures   CBC   Basic metabolic panel    Medication Changes: No orders of the defined types were placed in this encounter.   Follow Up: Pacemaker generator change out Monday, December 11  Signed, Sanda Klein, MD  06/13/2022 1:16 PM    [place]

## 2022-06-14 LAB — CBC
Hematocrit: 43.1 % (ref 34.0–46.6)
Hemoglobin: 15.2 g/dL (ref 11.1–15.9)
MCH: 31.1 pg (ref 26.6–33.0)
MCHC: 35.3 g/dL (ref 31.5–35.7)
MCV: 88 fL (ref 79–97)
Platelets: 212 10*3/uL (ref 150–450)
RBC: 4.89 x10E6/uL (ref 3.77–5.28)
RDW: 12.4 % (ref 11.7–15.4)
WBC: 9.9 10*3/uL (ref 3.4–10.8)

## 2022-06-14 LAB — BASIC METABOLIC PANEL
BUN/Creatinine Ratio: 19 (ref 12–28)
BUN: 15 mg/dL (ref 8–27)
CO2: 24 mmol/L (ref 20–29)
Calcium: 10 mg/dL (ref 8.7–10.3)
Chloride: 100 mmol/L (ref 96–106)
Creatinine, Ser: 0.79 mg/dL (ref 0.57–1.00)
Glucose: 95 mg/dL (ref 70–99)
Potassium: 4.4 mmol/L (ref 3.5–5.2)
Sodium: 138 mmol/L (ref 134–144)
eGFR: 84 mL/min/{1.73_m2} (ref >59)

## 2022-06-15 NOTE — Telephone Encounter (Signed)
Pt daughter started having sx a few days or so ago.   Her daughter tested positive for COVID Friday.   She tested positive today.  She is having some minor symptoms.  Not very sick.  She will quarantine per CDC guidelines.   Had to cancel the family vacation.   Please call her [phone] to reschedule her PPM.  No new sx from a heart standpoint.   Rosaria Ferries, PA-C 06/15/2022 4:04 PM

## 2022-06-16 ENCOUNTER — Encounter (HOSPITAL_COMMUNITY): Admission: RE | Payer: Self-pay | Source: Home / Self Care

## 2022-06-16 ENCOUNTER — Ambulatory Visit (HOSPITAL_COMMUNITY): Admission: RE | Admit: 2022-06-16 | Payer: 59 | Source: Home / Self Care | Admitting: Cardiovascular Disease

## 2022-06-16 DIAGNOSIS — I442 Atrioventricular block, complete: Secondary | ICD-10-CM

## 2022-06-16 SURGERY — PPM GENERATOR CHANGEOUT

## 2022-06-16 NOTE — Telephone Encounter (Signed)
Patient stated she was DX with COVID yesterday and had to cancel pace make insertion. Sending message to scheduler to contact patient to set up another procedure day.

## 2022-06-16 NOTE — Telephone Encounter (Signed)
Patient has tested positive for covid. Calling to see when her procedure can be reschedule. Please advise

## 2022-06-16 NOTE — Telephone Encounter (Signed)
Got disconnect from patient trying to get to triage. Please advise

## 2022-06-16 NOTE — Telephone Encounter (Signed)
Procedure rescheduled for Monday 12/18 at 1:30.  Patient aware and verbalized understanding

## 2022-06-16 NOTE — Telephone Encounter (Signed)
Patient called back to reschedule her procedure. Procedure is done in the hospital and not scheduled by the scheduling team.

## 2022-06-19 ENCOUNTER — Encounter: Payer: Self-pay | Admitting: Cardiovascular Disease

## 2022-06-19 DIAGNOSIS — E782 Mixed hyperlipidemia: Secondary | ICD-10-CM

## 2022-06-23 ENCOUNTER — Encounter (HOSPITAL_COMMUNITY)
Admission: RE | Disposition: A | Discharge status: Home / Self Care | Payer: Self-pay | Source: Ambulatory Visit | Attending: Cardiovascular Disease

## 2022-06-23 ENCOUNTER — Other Ambulatory Visit: Payer: Self-pay

## 2022-06-23 ENCOUNTER — Ambulatory Visit (HOSPITAL_COMMUNITY)
Admission: RE | Admit: 2022-06-23 | Discharge: 2022-06-23 | Disposition: A | Discharge status: Home / Self Care | Payer: 59 | Source: Ambulatory Visit | Attending: Cardiovascular Disease | Admitting: Cardiovascular Disease

## 2022-06-23 DIAGNOSIS — E119 Type 2 diabetes mellitus without complications: Secondary | ICD-10-CM | POA: Diagnosis not present

## 2022-06-23 DIAGNOSIS — I442 Atrioventricular block, complete: Secondary | ICD-10-CM | POA: Diagnosis not present

## 2022-06-23 DIAGNOSIS — Z45010 Encounter for checking and testing of cardiac pacemaker pulse generator [battery]: Secondary | ICD-10-CM

## 2022-06-23 DIAGNOSIS — F17210 Nicotine dependence, cigarettes, uncomplicated: Secondary | ICD-10-CM | POA: Diagnosis not present

## 2022-06-23 DIAGNOSIS — I10 Essential (primary) hypertension: Secondary | ICD-10-CM | POA: Insufficient documentation

## 2022-06-23 DIAGNOSIS — Z8249 Family history of ischemic heart disease and other diseases of the circulatory system: Secondary | ICD-10-CM | POA: Diagnosis not present

## 2022-06-23 HISTORY — PX: PPM GENERATOR CHANGEOUT: EP1233

## 2022-06-23 LAB — GLUCOSE, CAPILLARY: Glucose-Capillary: 117 mg/dL — ABNORMAL HIGH (ref 70–99)

## 2022-06-23 SURGERY — PPM GENERATOR CHANGEOUT

## 2022-06-23 MED ORDER — ACETAMINOPHEN 325 MG PO TABS: 325.0000 mg | ORAL_TABLET | ORAL | Status: DC | PRN

## 2022-06-23 MED ORDER — LIDOCAINE HCL (PF) 1 % IJ SOLN
INTRAMUSCULAR | Status: AC
  Filled 2022-06-23: qty 30

## 2022-06-23 MED ORDER — SODIUM CHLORIDE 0.9 % IV SOLN
80.0000 mg | INTRAVENOUS | Status: AC
  Administered 2022-06-23: 80 mg

## 2022-06-23 MED ORDER — CHLORHEXIDINE GLUCONATE 4 % EX LIQD
4.0000 | Freq: Once | CUTANEOUS | Status: DC
  Filled 2022-06-23: qty 60

## 2022-06-23 MED ORDER — SODIUM CHLORIDE 0.9% FLUSH: 3.0000 mL | INTRAVENOUS | Status: DC | PRN

## 2022-06-23 MED ORDER — CEFAZOLIN SODIUM-DEXTROSE 2-4 GM/100ML-% IV SOLN: 2.0000 g | INTRAVENOUS | Status: DC

## 2022-06-23 MED ORDER — VANCOMYCIN HCL IN DEXTROSE 1-5 GM/200ML-% IV SOLN
1000.0000 mg | Freq: Once | INTRAVENOUS | Status: AC
  Administered 2022-06-23: 1000 mg via INTRAVENOUS

## 2022-06-23 MED ORDER — VANCOMYCIN HCL IN DEXTROSE 1-5 GM/200ML-% IV SOLN
INTRAVENOUS | Status: AC
  Filled 2022-06-23: qty 200

## 2022-06-23 MED ORDER — SODIUM CHLORIDE 0.9 % IV SOLN: INTRAVENOUS | Status: DC | PRN

## 2022-06-23 MED ORDER — ONDANSETRON HCL 4 MG/2ML IJ SOLN: 4.0000 mg | Freq: Four times a day (QID) | INTRAMUSCULAR | Status: DC | PRN

## 2022-06-23 MED ORDER — SODIUM CHLORIDE 0.9 % IV SOLN: INTRAVENOUS | Status: DC

## 2022-06-23 MED ORDER — HEPARIN (PORCINE) IN NACL 1000-0.9 UT/500ML-% IV SOLN: INTRAVENOUS | Status: DC | PRN

## 2022-06-23 MED ORDER — SODIUM CHLORIDE 0.9 % IV SOLN
INTRAVENOUS | Status: AC
  Filled 2022-06-23: qty 2

## 2022-06-23 MED ORDER — LIDOCAINE HCL (PF) 1 % IJ SOLN
INTRAMUSCULAR | Status: DC | PRN
  Administered 2022-06-23: 30 mL via INTRADERMAL

## 2022-06-23 MED ORDER — CEFAZOLIN SODIUM-DEXTROSE 2-4 GM/100ML-% IV SOLN
INTRAVENOUS | Status: AC
  Filled 2022-06-23: qty 100

## 2022-06-23 SURGICAL SUPPLY — 7 items
CABLE SURGICAL S-101-97-12 (CABLE) ×1 IMPLANT
IPG PACE AZUR XT DR MRI W1DR01 (Pacemaker) IMPLANT
PACE AZURE XT DR MRI W1DR01 (Pacemaker) ×1 IMPLANT
PAD DEFIB RADIO PHYSIO CONN (PAD) ×1 IMPLANT
POUCH AIGIS-R ANTIBACT PPM (Mesh General) ×1 IMPLANT
POUCH AIGIS-R ANTIBACT PPM MED (Mesh General) IMPLANT
TRAY PACEMAKER INSERTION (PACKS) ×1 IMPLANT

## 2022-06-23 NOTE — H&P (Signed)
Virtual Visit via Telephone Note    Because of Mandy Sellers's co-morbid illnesses, she is at least at moderate risk for complications without adequate follow up.  This format is felt to be most appropriate for this patient at this time.  The patient did not have access to video technology/had technical difficulties with video requiring transitioning to audio format only (telephone).  All issues noted in this document were discussed and addressed.  No physical exam could be performed with this format.  Please refer to the patient's chart for her consent to telehealth for [facility].      Date:  06/13/2022    ID:  Mandy Sellers, DOB January 23, 1961, MRN [id number] The patient was identified using 2 identifiers.   Patient Location: Home Provider Location: Office/Clinic     PCP:  Charlane Ferretti, MD              [place] [facility] Cardiologist:  Sanda Klein, MD      Evaluation Performed:  Follow-Up Visit   Chief Complaint:  Pacemaker battery depletion   History of Present Illness:     Mandy Sellers is a 61 y.o. female with complete heart block, pacemaker dependent since 2010.  She has a dual-chamber Medtronic Adapta device that reported anticipated generator longevity of 15 months in July 2023, but rapidly down to 7 months in October 2023, 5 months at the office appointment in November 2023 and now with ERI anticipated in less than 1 month by a download earlier today.   She is asymptomatic.  Denies any problems with palpitations, dizziness, weakness, syncope or excessive fatigue.  Other than the rapid battery depletion the pacemaker function is normal.   At her last pacemaker check she did have an idioventricular escape rhythm at about 35 bpm.  Her ECG shows very prominent positive R wave in the septal leads and by CT her right ventricular lead is seen to be located in the middle cardiac vein.   Additional medical problems include hypertension, type  2 diabetes mellitus, tobacco abuse.       Past Medical History:  Diagnosis Date   CHB (complete heart block) ([place]) 08/09/2013   COPD (chronic obstructive pulmonary disease) ([place])     Depression     Diabetes mellitus without complication (HCC)     HTN (hypertension) 08/09/2013   Hypertension     Obesity (BMI 30.0-34.9) 08/09/2013   Pacemaker      dual-chmber for CHB in 2010   Tobacco abuse           Past Surgical History:  Procedure Laterality Date   COLONOSCOPY   2012    Dr.Brodie   DILATATION & CURRETTAGE/HYSTEROSCOPY WITH RESECTOCOPE N/A 11/10/2013    Procedure: DILATATION & CURETTAGE/HYSTEROSCOPY WITH MYOSURE;  Surgeon: Darlyn Chamber, MD;  Location: [facility];  Service: Gynecology;  Laterality: N/A;   INCONTINENCE SURGERY   03/07/2000   INSERT / REPLACE / REMOVE PACEMAKER       LUMBAR SPINE SURGERY   2021   NM MYOCAR PERF WALL MOTION   05/07/2009    bruce myoview; normal pattern of perfusion in all regions, low risk scan   PACEMAKER INSERTION   03/30/2009    for CHB; Medtronic Adapta ADDRL1 - Dr. [place] ECHOCARDIOGRAM   09/05/2010    EF=>55%; mild MR; trace pulm valve regurg    TUBAL LIGATION   03/07/2000      Active  Medications  No outpatient medications have been marked as taking for the 06/13/22 encounter (Telephone) with Caprice Beaver, LPN.        Allergies:   Amoxicillin    Social History         Tobacco Use   Smoking status: Heavy Smoker      Packs/day: 1.50      Years: 44.00      Total pack years: 66.00      Types: Cigarettes   Smokeless tobacco: Never  Vaping Use   Vaping Use: Never used  Substance Use Topics   Alcohol use: Yes      Alcohol/week: 3.0 standard drinks of alcohol      Types: 3 Standard drinks or equivalent per week      Comment: social   Drug use: No      Family Hx: The patient's family history includes Alcohol abuse in her father; Deep vein thrombosis in her brother; Diabetes in her father and mother;  Hypertension in her father; Stroke in her father. There is no history of Colon cancer, Colon polyps, Esophageal cancer, Rectal cancer, or Stomach cancer.   ROS:   Please see the history of present illness.      All other systems reviewed and are negative.     Prior CV studies:   The following studies were reviewed today:   Pacemaker alert today showing device with less than 1 month until RRT   Labs/Other Tests and Data Reviewed:     EKG:  An ECG dated 05/12/2022 was personally reviewed today and demonstrated:  Atrial sensed (sinus), ventricular paced rhythm with positive R wave in lead V1   Recent Labs: No results found for requested labs within last 365 days.    Recent Lipid Panel Labs (Brief)        Lab Results  Component Value Date/Time    CHOL   03/29/2009 07:30 AM      144        ATP III CLASSIFICATION:  <200     mg/dL   Desirable  200-239  mg/dL   Borderline High  >=240    mg/dL   High           TRIG 123 03/29/2009 07:30 AM    HDL 50 03/29/2009 07:30 AM    CHOLHDL 2.9 03/29/2009 07:30 AM    LDLCALC   03/29/2009 07:30 AM      69        Total Cholesterol/HDL:CHD Risk Coronary Heart Disease Risk Table                     Men   Women  1/2 Average Risk   3.4   3.3  Average Risk       5.0   4.4  2 X Average Risk   9.6   7.1  3 X Average Risk  23.4   11.0        Use the calculated Patient Ratio above and the CHD Risk Table to determine the patient's CHD Risk.        ATP III CLASSIFICATION (LDL):  <100     mg/dL   Optimal  100-129  mg/dL   Near or Above                    Optimal  130-159  mg/dL   Borderline  160-189  mg/dL   High  >190     mg/dL   Very High  Wt Readings from Last 3 Encounters:  05/12/22 82.3 kg  09/12/21 89.4 kg  08/29/21 89.4 kg      Risk Assessment/Calculations:           Objective:     BP 128/78   Pulse 65   Temp 97.6 F (36.4 C) (Temporal)   Resp 15   Ht '5\' 6"'$  (1.676 m)   Wt 82.1 kg   LMP 06/15/2013   SpO2  97%   BMI 29.21 kg/m    General: Alert, oriented x3, no distress, overweight. Healthy subclavian PM site Head: no evidence of trauma, PERRL, EOMI, no exophtalmos or lid lag, no myxedema, no xanthelasma; normal ears, nose and oropharynx Neck: normal jugular venous pulsations and no hepatojugular reflux; brisk carotid pulses without delay and no carotid bruits Chest: clear to auscultation, no signs of consolidation by percussion or palpation, normal fremitus, symmetrical and full respiratory excursions Cardiovascular: normal position and quality of the apical impulse, regular rhythm, normal first and second heart sounds, no murmurs, rubs or gallops Abdomen: no tenderness or distention, no masses by palpation, no abnormal pulsatility or arterial bruits, normal bowel sounds, no hepatosplenomegaly Extremities: no clubbing, cyanosis or edema; 2+ radial, ulnar and brachial pulses bilaterally; 2+ right femoral, posterior tibial and dorsalis pedis pulses; 2+ left femoral, posterior tibial and dorsalis pedis pulses; no subclavian or femoral bruits Neurological: grossly nonfocal Psych: Normal mood and affect  ASSESSMENT & PLAN:     Pacemaker dependent patient with complete heart block and unexpectedly rapid battery depletion this year, anticipate RRT in less than a month.  Will bring her in for pacemaker generator change out.  Discussed the procedure in detail today including possible complications, in particular risk of infection.  Will benefit from an Aigis pouch. This procedure has been fully reviewed with the patient and written informed consent has been obtained.       Initially scheduled for Decmber 11, the procedure was delayed until today after the patient had COVID-19 infection. She has revovered from that infection without lastng symptoms.     Time:   Today, I have spent 25 minutes with the patient with telehealth technology discussing the above problems.       Medication Adjustments/Labs  and Tests Ordered: Current medicines are reviewed at length with the patient today.  Concerns regarding medicines are outlined above.    Tests Ordered:    Orders Placed This Encounter  Procedures   CBC   Basic metabolic panel      Medication Changes: No orders of the defined types were placed in this encounter.     Follow Up: Pacemaker generator change out Monday, December 18   Signed, Sanda Klein, MD  06/13/2022 1:16 PM    [place]

## 2022-06-23 NOTE — Discharge Instructions (Addendum)
Supplemental Discharge Instructions for  Pacemaker/Defibrillator Patients  Activity No restrictions. DO wear your seatbelt, even if it crosses over the pacemaker site.  WOUND CARE Keep the wound area clean and dry.  Remove the dressing the day after you return home (usually 48 hours after the procedure). DO NOT SUBMERGE UNDER WATER UNTIL FULLY HEALED (no tub baths, hot tubs, swimming pools, etc.).  You  may shower or take a sponge bath after the dressing is removed. DO NOT SOAK the area and do not allow the shower to directly spray on the site. If you have staples, these will be removed in the office in 7-14 days. If you have tape/steri-strips on your wound, these will fall off; do not pull them off prematurely.   No bandage is needed on the site.  DO  NOT apply any creams, oils, or ointments to the wound area. If you notice any drainage or discharge from the wound, any swelling, excessive redness or bruising at the site, or if you develop a fever > 101? F after you are discharged home, call the office at once.  Special Instructions You are still able to use cellular telephones.  Avoid carrying your cellular phone near your device. When traveling through airports, show security personnel your identification card to avoid being screened in the metal detectors.  Avoid arc welding equipment, MRI testing (magnetic resonance imaging), TENS units (transcutaneous nerve stimulators).  Call the office for questions about other devices. Avoid electrical appliances that are in poor condition or are not properly grounded. Microwave ovens are safe to be near or to operate.  Implantable Cardiac Device Battery Change, Care After  This sheet gives you information about how to care for yourself after your procedure. Your health care provider may also give you more specific instructions. If you have problems or questions, contact your health care provider. What can I expect after the procedure? After your  procedure, it is common to have: Pain or soreness at the site where the cardiac device was inserted. Swelling at the site where the cardiac device was inserted. You should received an information card for your new device in 4-8 weeks. Follow these instructions at home: Incision care  Keep the incision clean and dry. Do not take baths, swim, or use a hot tub until after your wound check.  Do not shower for at least 7 days, or as directed by your health care provider. Pat the area dry with a clean towel. Do not rub the area. This may cause bleeding. Follow instructions from your health care provider about how to take care of your incision. Make sure you: Leave stitches (sutures), skin glue, or adhesive strips in place. These skin closures may need to stay in place for 2 weeks or longer. If adhesive strip edges start to loosen and curl up, you may trim the loose edges. Do not remove adhesive strips completely unless your health care provider tells you to do that. Check your incision area every day for signs of infection. Check for: More redness, swelling, or pain. More fluid or blood. Warmth. Pus or a bad smell. Activity Do not lift anything that is heavier than 10 lb (4.5 kg) until your health care provider says it is okay to do so. For the first week, or as long as told by your health care provider: Avoid lifting your affected arm higher than your shoulder. After 1 week, Be gentle when you move your arms over your head. It is okay to raise  your arm to comb your hair. Avoid strenuous exercise. Ask your health care provider when it is okay to: Resume your normal activities. Return to work or school. Resume sexual activity. Eating and drinking Eat a heart-healthy diet. This should include plenty of fresh fruits and vegetables, whole grains, low-fat dairy products, and lean protein like chicken and fish. Limit alcohol intake to no more than 1 drink a day for non-pregnant women and 2 drinks a  day for men. One drink equals 12 oz of beer, 5 oz of wine, or 1 oz of hard liquor. Check ingredients and nutrition facts on packaged foods and beverages. Avoid the following types of food: Food that is high in salt (sodium). Food that is high in saturated fat, like full-fat dairy or red meat. Food that is high in trans fat, like fried food. Food and drinks that are high in sugar. Lifestyle Do not use any products that contain nicotine or tobacco, such as cigarettes and e-cigarettes. If you need help quitting, ask your health care provider. Take steps to manage and control your weight. Once cleared, get regular exercise. Aim for 150 minutes of moderate-intensity exercise (such as walking or yoga) or 75 minutes of vigorous exercise (such as running or swimming) each week. Manage other health problems, such as diabetes or high blood pressure. Ask your health care provider how you can manage these conditions. General instructions Do not drive for 24 hours after your procedure if you were given a medicine to help you relax (sedative). Take over-the-counter and prescription medicines only as told by your health care provider. Avoid putting pressure on the area where the cardiac device was placed. If you need an MRI after your cardiac device has been placed, be sure to tell the health care provider who orders the MRI that you have a cardiac device. Avoid close and prolonged exposure to electrical devices that have strong magnetic fields. These include: Cell phones. Avoid keeping them in a pocket near the cardiac device, and try using the ear opposite the cardiac device. MP3 players. Household appliances, like microwaves. Metal detectors. Electric generators. High-tension wires. Keep all follow-up visits as directed by your health care provider. This is important. Contact a health care provider if: You have pain at the incision site that is not relieved by over-the-counter or prescription  medicines. You have any of these around your incision site or coming from it: More redness, swelling, or pain. Fluid or blood. Warmth to the touch. Pus or a bad smell. You have a fever. You feel brief, occasional palpitations, light-headedness, or any symptoms that you think might be related to your heart. Get help right away if: You experience chest pain that is different from the pain at the cardiac device site. You develop a red streak that extends above or below the incision site. You experience shortness of breath. You have palpitations or an irregular heartbeat. You have light-headedness that does not go away quickly. You faint or have dizzy spells. Your pulse suddenly drops or increases rapidly and does not return to normal. You begin to gain weight and your legs and ankles swell. Summary After your procedure, it is common to have pain, soreness, and some swelling where the cardiac device was inserted. Make sure to keep your incision clean and dry. Follow instructions from your health care provider about how to take care of your incision. Check your incision every day for signs of infection, such as more pain or swelling, pus or a bad smell,  warmth, or leaking fluid and blood. Avoid strenuous exercise and lifting your left arm higher than your shoulder for 2 weeks, or as long as told by your health care provider. This information is not intended to replace advice given to you by your health care provider. Make sure you discuss any questions you have with your health care provider.

## 2022-06-23 NOTE — Op Note (Signed)
Procedure report  Procedure performed:  1. Dual chamber pacemaker generator changeout  2. Aigis pouch  Reason for procedure:  1. Device generator at elective replacement interval  2. Complete heart block Procedure performed by:  Sanda Klein, MD  Complications:  None  Estimated blood loss:  <5 mL  Medications administered during procedure:  Vancomycin 1 g intravenously, lidocaine 1% 30 mL locally  Device details:   New Generator Medtronic Azure XT DR MRI model number [id number], serial number [id number] Right atrial lead (chronic) Medtronic [id number], serial number [id number] (implanted 03/30/2009) Right ventricular lead (chronic)  Medtronic [id number], serial number [id number] (implanted 03/30/2009)  Explanted generator Medtronic Adapta DR ADDRL1, serial number [id number]  Procedure details:  After the risks and benefits of the procedure were discussed the patient provided informed consent. She was brought to the cardiac catheter lab in the fasting state. The patient was prepped and draped in usual sterile fashion. Local anesthesia with 1% lidocaine was administered to to the left infraclavicular area. A 5-6cm horizontal incision was made parallel with and 2-3 cm caudal to the left clavicle, in the area of an old scar. Using minimal electrocautery and mostly sharp and blunt dissection the prepectoral pocket was opened carefully to avoid injury to the loops of chronic leads. Extensive dissection was not necessary. The device was explanted. The pocket was carefully inspected for hemostasis and flushed with copious amounts of antibiotic solution.  The leads were disconnected from the old generator and the new generator was connected to the chronic leads, with appropriate pacing noted. testing of the lead parameters via telemetry showed unchanged values. The device was placed in an Aigis pouch.  The entire system was then carefully inserted in the pocket with care been taking that the leads and  device assumed a comfortable position without pressure on the incision. Great care was taken that the leads be located deep to the generator. The pocket was then closed in layers using 2 layers of 2-0 Vicryl, one layer of 3-0 Vicryl and cutaneous steristrips after which a sterile dressing was applied.   At the end of the procedure the following lead parameters were encountered:   Right atrial lead sensed P waves 5.6 mV, impedance 475 ohms, threshold 0.5 at 0.4 ms pulse width.  Right ventricular lead sensed R waves  none detected, impedance 456 ohms, threshold 0.5 at 0.4 ms pulse width.  Sanda Klein, MD, [facility] [phone] office [phone] pager

## 2022-06-24 ENCOUNTER — Encounter (HOSPITAL_COMMUNITY): Payer: Self-pay | Admitting: Cardiovascular Disease

## 2022-06-26 DIAGNOSIS — J069 Acute upper respiratory infection, unspecified: Secondary | ICD-10-CM | POA: Diagnosis not present

## 2022-06-26 DIAGNOSIS — Z950 Presence of cardiac pacemaker: Secondary | ICD-10-CM | POA: Diagnosis not present

## 2022-06-26 DIAGNOSIS — R0981 Nasal congestion: Secondary | ICD-10-CM | POA: Diagnosis not present

## 2022-06-26 DIAGNOSIS — M461 Sacroiliitis, not elsewhere classified: Secondary | ICD-10-CM | POA: Diagnosis not present

## 2022-06-26 DIAGNOSIS — R058 Other specified cough: Secondary | ICD-10-CM | POA: Diagnosis not present

## 2022-06-26 DIAGNOSIS — R059 Cough, unspecified: Secondary | ICD-10-CM | POA: Diagnosis not present

## 2022-06-26 DIAGNOSIS — M5416 Radiculopathy, lumbar region: Secondary | ICD-10-CM | POA: Diagnosis not present

## 2022-06-26 DIAGNOSIS — U071 COVID-19: Secondary | ICD-10-CM | POA: Diagnosis not present

## 2022-07-01 ENCOUNTER — Ambulatory Visit (HOSPITAL_COMMUNITY)
Admission: RE | Admit: 2022-07-01 | Discharge: 2022-07-01 | Disposition: A | Discharge status: Home / Self Care | Payer: 59 | Source: Ambulatory Visit | Attending: Cardiovascular Disease | Admitting: Cardiovascular Disease

## 2022-07-01 DIAGNOSIS — E782 Mixed hyperlipidemia: Secondary | ICD-10-CM | POA: Insufficient documentation

## 2022-07-01 DIAGNOSIS — J0110 Acute frontal sinusitis, unspecified: Secondary | ICD-10-CM | POA: Diagnosis not present

## 2022-07-01 DIAGNOSIS — E1169 Type 2 diabetes mellitus with other specified complication: Secondary | ICD-10-CM | POA: Diagnosis not present

## 2022-07-01 DIAGNOSIS — I10 Essential (primary) hypertension: Secondary | ICD-10-CM | POA: Diagnosis not present

## 2022-07-01 DIAGNOSIS — Z950 Presence of cardiac pacemaker: Secondary | ICD-10-CM | POA: Diagnosis not present

## 2022-07-02 ENCOUNTER — Telehealth: Payer: Self-pay

## 2022-07-02 NOTE — Telephone Encounter (Signed)
Pt left a voicemail with questions about remote appointments and where to bring her old monitor.   I called her back but got the voicemail. I asked her to give [place] a call back.

## 2022-07-02 NOTE — Telephone Encounter (Signed)
I spoke with the patient and answer all her questions. I made her 09-24-2022 remote transmission a no charge because she will be charged $100 if she see the doctor on 09-29-2022.

## 2022-07-03 ENCOUNTER — Other Ambulatory Visit: Payer: Self-pay

## 2022-07-03 DIAGNOSIS — E669 Obesity, unspecified: Secondary | ICD-10-CM

## 2022-07-03 DIAGNOSIS — E119 Type 2 diabetes mellitus without complications: Secondary | ICD-10-CM

## 2022-07-03 DIAGNOSIS — E66811 Obesity, class 1: Secondary | ICD-10-CM

## 2022-07-03 DIAGNOSIS — E663 Overweight: Secondary | ICD-10-CM

## 2022-07-03 DIAGNOSIS — E782 Mixed hyperlipidemia: Secondary | ICD-10-CM

## 2022-07-03 MED ORDER — ROSUVASTATIN CALCIUM 10 MG PO TABS: 10.0000 mg | ORAL_TABLET | Freq: Every day | ORAL | 6 refills | Status: DC

## 2022-07-04 ENCOUNTER — Ambulatory Visit: Payer: 59 | Attending: Cardiovascular Disease

## 2022-07-04 DIAGNOSIS — I442 Atrioventricular block, complete: Secondary | ICD-10-CM

## 2022-07-04 LAB — CUP PACEART INCLINIC DEVICE CHECK
Battery Remaining Longevity: 136 mo
Battery Voltage: 3.21 V
Brady Statistic AP VP Percent: 0.26 %
Brady Statistic AP VS Percent: 0 %
Brady Statistic AS VP Percent: 99.73 %
Brady Statistic AS VS Percent: 0 %
Brady Statistic RA Percent Paced: 0.3 %
Brady Statistic RV Percent Paced: 100 %
Date Time Interrogation Session: 20231229202223
Implantable Lead Connection Status: 753985
Implantable Lead Connection Status: 753985
Implantable Lead Implant Date: 20100924
Implantable Lead Implant Date: 20100924
Implantable Lead Location: 753859
Implantable Lead Location: 753860
Implantable Lead Model: 5076
Implantable Lead Model: 5076
Implantable Pulse Generator Implant Date: 20231218
Lead Channel Impedance Value: 380 Ohm
Lead Channel Impedance Value: 437 Ohm
Lead Channel Impedance Value: 437 Ohm
Lead Channel Impedance Value: 456 Ohm
Lead Channel Pacing Threshold Amplitude: 0.5 V
Lead Channel Pacing Threshold Amplitude: 0.625 V
Lead Channel Pacing Threshold Amplitude: 0.75 V
Lead Channel Pacing Threshold Pulse Width: 0.4 ms
Lead Channel Pacing Threshold Pulse Width: 0.4 ms
Lead Channel Pacing Threshold Pulse Width: 0.4 ms
Lead Channel Sensing Intrinsic Amplitude: 3 mV
Lead Channel Sensing Intrinsic Amplitude: 3.875 mV
Lead Channel Setting Pacing Amplitude: 1.5 V
Lead Channel Setting Pacing Amplitude: 2.25 V
Lead Channel Setting Pacing Pulse Width: 0.4 ms
Lead Channel Setting Sensing Sensitivity: 4 mV
Zone Setting Status: 755011

## 2022-07-04 NOTE — Addendum Note (Signed)
Addended by: Cheri Kearns A on: 07/04/2022 11:04 AM   Modules accepted: Level of Service

## 2022-07-04 NOTE — Patient Instructions (Signed)
   After Your Pacemaker   Monitor your pacemaker site for redness, swelling, and drainage. Call the device clinic at [phone] if you experience these symptoms or fever/chills.  Your incision was closed with Steri-strips or staples:  You may shower 7 days after your procedure and wash your incision with soap and water. Avoid lotions, ointments, or perfumes over your incision until it is well-healed.  You may use a hot tub or a pool after your wound check appointment if the incision is completely closed.   There are no other restrictions in arm movement after your wound check appointment.  You may drive, unless driving has been restricted by your healthcare providers.  Remote monitoring is used to monitor your pacemaker from home. This monitoring is scheduled every 91 days by our office. It allows us to keep an eye on the functioning of your device to ensure it is working properly. You will routinely see your Electrophysiologist annually (more often if necessary).  

## 2022-07-04 NOTE — Progress Notes (Addendum)
Wound check appointment. Steri-strips removed. Wound without redness or edema. Incision edges approximated, wound well healed. Normal device function. Thresholds, sensing, and impedances consistent with implant measurements.   Device programmed for appropriate safety margins.  The leads are chronic.  Histogram distribution appropriate for patient and level of activity. 2 atrial events, fastest A rate 131, appears slow ATach, some PAC'S. V rate controlled at 80bpm during episodes. Patient has had resp. infection and received shot of prednisone around that date (12/22.)   No high ventricular rates noted. Patient educated about wound care, arm mobility, lifting restrictions. ROV in 3 months with implanting physician.

## 2022-07-04 NOTE — Progress Notes (Signed)
Remote pacemaker transmission.   

## 2022-07-10 ENCOUNTER — Encounter: Payer: 59 | Admitting: Cardiovascular Disease

## 2022-07-17 ENCOUNTER — Telehealth: Payer: Self-pay | Admitting: Nurse Practitioner

## 2022-07-17 NOTE — Telephone Encounter (Signed)
Received a call directly from operator from [facility] at Dr. Arita Miss, DDS. She requests clearance for patient to undergo dental cleaning today. I asked about any equipment that may be used that may interfere with pacemaker function and she reports sometimes the Cavitron machine is used. I requested an official clearance be faxed to our office so that I could get input from our device team and postponing the patient's cleaning.  She reports patient is adamant about having her teeth cleaned today.  I advised I will clear her for a simple cleaning with no equipment to which Drema agreed.  I have asked her to fax any future requests regarding specific dental procedures to our office.     Primary Cardiologist: Sanda Klein, MD  Chart reviewed as part of pre-operative protocol coverage. Simple dental extractions are considered low risk procedures per guidelines and generally do not require any specific cardiac clearance. It is also generally accepted that for simple extractions and dental cleanings, there is no need to interrupt blood thinner therapy. At wound check appointment 07/04/22, patient's site was well-healed with incision edges well-approximated, no redness or edema.  SBE prophylaxis is not required for the patient.  I will route this recommendation to the requesting party via Epic fax function and remove from pre-op pool.  Please call with questions.  Emmaline Life, NP-C  07/17/2022, 8:23 AM [address] Office [phone] Fax [phone]

## 2022-07-18 NOTE — Telephone Encounter (Signed)
Tried to call the DDS office; however they are closed on Friday's. See previous notes. We need the DDS office to send over a clearance request with complete information of the procedure to be done. Notes were faxed back to [place] with a hand written note asking if they can use Cavitron machine that deep cleans the teeth. Pt has pacemaker which we will need to be sure if it will be safe to use Cavitron machine. Seems to be that the pt is having a dental cleaning; however we need complete information sent to [place] so that we are able to make a well informed decision. Their office is closed today; however I will fax this note back to their office with the information that we need.   We need: PROCEDURE: if dental cleaning; please specify if a regular cleaning or deep cleaning; if any equipment to be used such Cavitron machine  ANY MEDS TO BE HELD:  DATE OF PROCEDURE IF SCHEDULED OR TBD:  PH AN FAX #;  NAME OF PROVIDER PERFORMING PROCEDURE:  ANESTHESIA TO BE USED IF ANY:

## 2022-08-04 ENCOUNTER — Ambulatory Visit
Admission: RE | Admit: 2022-08-04 | Discharge: 2022-08-04 | Disposition: A | Discharge status: Home / Self Care | Payer: Commercial Managed Care - HMO | Source: Ambulatory Visit | Attending: Internal Medicine | Admitting: Internal Medicine

## 2022-08-04 ENCOUNTER — Other Ambulatory Visit: Payer: Self-pay | Admitting: Internal Medicine

## 2022-08-04 DIAGNOSIS — J069 Acute upper respiratory infection, unspecified: Secondary | ICD-10-CM

## 2022-08-15 ENCOUNTER — Other Ambulatory Visit (HOSPITAL_COMMUNITY): Payer: Self-pay | Admitting: Internal Medicine

## 2022-08-15 DIAGNOSIS — G249 Dystonia, unspecified: Secondary | ICD-10-CM

## 2022-08-29 ENCOUNTER — Telehealth: Payer: Self-pay | Admitting: Cardiovascular Disease

## 2022-08-29 MED ORDER — HYDROCHLOROTHIAZIDE 12.5 MG PO CAPS: 12.5000 mg | ORAL_CAPSULE | Freq: Every day | ORAL | 3 refills | Status: DC

## 2022-08-29 NOTE — Telephone Encounter (Signed)
*  STAT* If patient is at the pharmacy, call can be transferred to refill team.   1. Which medications need to be refilled? (please list name of each medication and dose if known)   hydrochlorothiazide (MICROZIDE) 12.5 MG capsule  [facility]  2. Which pharmacy/location (including street and city if local pharmacy) is medication to be sent to?   PLEASE SEND TO - [facility] PT INSURANCE HAS CHANGED TO [employer]  PHARMACY IN CHART UPDATED AS WELL   3. Do they need a 30 day or 90 day supply? [place]

## 2022-08-29 NOTE — Telephone Encounter (Signed)
[facility]

## 2022-08-29 NOTE — Addendum Note (Signed)
Addended by: Ronie Spies on: 08/29/2022 12:30 PM   Modules accepted: Orders

## 2022-09-29 ENCOUNTER — Encounter: Payer: 59 | Admitting: Cardiovascular Disease

## 2022-10-02 ENCOUNTER — Other Ambulatory Visit (HOSPITAL_COMMUNITY): Payer: Self-pay | Admitting: Physician Assistant

## 2022-10-02 ENCOUNTER — Ambulatory Visit (INDEPENDENT_AMBULATORY_CARE_PROVIDER_SITE_OTHER): Payer: Commercial Managed Care - HMO

## 2022-10-02 DIAGNOSIS — M5416 Radiculopathy, lumbar region: Secondary | ICD-10-CM

## 2022-10-02 DIAGNOSIS — I442 Atrioventricular block, complete: Secondary | ICD-10-CM

## 2022-10-05 LAB — CUP PACEART REMOTE DEVICE CHECK
Battery Remaining Longevity: 144 mo
Battery Voltage: 3.17 V
Brady Statistic AP VP Percent: 1.21 %
Brady Statistic AP VS Percent: 0 %
Brady Statistic AS VP Percent: 98.78 %
Brady Statistic AS VS Percent: 0.01 %
Brady Statistic RA Percent Paced: 1.25 %
Brady Statistic RV Percent Paced: 99.99 %
Date Time Interrogation Session: 20240330032732
Implantable Lead Connection Status: 753985
Implantable Lead Connection Status: 753985
Implantable Lead Implant Date: 20100924
Implantable Lead Implant Date: 20100924
Implantable Lead Location: 753859
Implantable Lead Location: 753860
Implantable Lead Model: 5076
Implantable Lead Model: 5076
Implantable Pulse Generator Implant Date: 20231218
Lead Channel Impedance Value: 418 Ohm
Lead Channel Impedance Value: 437 Ohm
Lead Channel Impedance Value: 437 Ohm
Lead Channel Impedance Value: 437 Ohm
Lead Channel Pacing Threshold Amplitude: 0.5 V
Lead Channel Pacing Threshold Amplitude: 0.75 V
Lead Channel Pacing Threshold Pulse Width: 0.4 ms
Lead Channel Pacing Threshold Pulse Width: 0.4 ms
Lead Channel Sensing Intrinsic Amplitude: 4.75 mV
Lead Channel Sensing Intrinsic Amplitude: 4.75 mV
Lead Channel Setting Pacing Amplitude: 1.5 V
Lead Channel Setting Pacing Amplitude: 2 V
Lead Channel Setting Pacing Pulse Width: 0.4 ms
Lead Channel Setting Sensing Sensitivity: 4 mV
Zone Setting Status: 755011

## 2022-10-09 ENCOUNTER — Ambulatory Visit: Payer: Commercial Managed Care - HMO | Attending: Cardiovascular Disease | Admitting: Cardiovascular Disease

## 2022-10-09 ENCOUNTER — Encounter: Payer: Self-pay | Admitting: Cardiovascular Disease

## 2022-10-09 VITALS — BP 142/93 | HR 104 | Ht 66.5 in | Wt 193.0 lb

## 2022-10-09 DIAGNOSIS — E663 Overweight: Secondary | ICD-10-CM

## 2022-10-09 DIAGNOSIS — F17200 Nicotine dependence, unspecified, uncomplicated: Secondary | ICD-10-CM

## 2022-10-09 DIAGNOSIS — I10 Essential (primary) hypertension: Secondary | ICD-10-CM

## 2022-10-09 DIAGNOSIS — I442 Atrioventricular block, complete: Secondary | ICD-10-CM

## 2022-10-09 DIAGNOSIS — E119 Type 2 diabetes mellitus without complications: Secondary | ICD-10-CM

## 2022-10-09 DIAGNOSIS — Z950 Presence of cardiac pacemaker: Secondary | ICD-10-CM

## 2022-10-09 DIAGNOSIS — E782 Mixed hyperlipidemia: Secondary | ICD-10-CM

## 2022-10-09 NOTE — Progress Notes (Unsigned)
Cardiology Office Note       Date:  06/13/2022    ID:  Mandy Sellers, DOB 04/13/1961, MRN [id number] The patient was identified using 2 identifiers.    PCP:  Thana Ates, MD              [facility] Providers Cardiologist:  Thurmon Fair, MD      Evaluation Performed:  Follow-Up Visit   Chief Complaint:  Pacemaker check   History of Present Illness:     Mandy Sellers is a 62 y.o. female with complete heart block, pacemaker dependent since 2010, now 3 months s/p generator change (MRI conditional system -Medtronic Azure XT DR MRI , [id number] A and V leads).   She is asymptomatic.  Denies any problems with palpitations, dizziness, weakness, syncope or excessive fatigue.  Other than the rapid battery depletion the pacemaker function is normal.   At her last pacemaker check she did have an idioventricular escape rhythm at about 35 bpm.  Her ECG shows very prominent positive R wave in the septal leads and by CT her right ventricular lead is seen to be located in the middle cardiac vein.   Additional medical problems include hypertension, type 2 diabetes mellitus, tobacco abuse.       Past Medical History:  Diagnosis Date   CHB (complete heart block) (HCC) 08/09/2013   COPD (chronic obstructive pulmonary disease) (HCC)     Depression     Diabetes mellitus without complication (HCC)     HTN (hypertension) 08/09/2013   Hypertension     Obesity (BMI 30.0-34.9) 08/09/2013   Pacemaker      dual-chmber for CHB in 2010   Tobacco abuse           Past Surgical History:  Procedure Laterality Date   COLONOSCOPY   2012    Dr.Brodie   DILATATION & CURRETTAGE/HYSTEROSCOPY WITH RESECTOCOPE N/A 11/10/2013    Procedure: DILATATION & CURETTAGE/HYSTEROSCOPY WITH MYOSURE;  Surgeon: Juluis Mire, MD;  Location: [facility];  Service: Gynecology;  Laterality: N/A;   INCONTINENCE SURGERY   03/07/2000   INSERT / REPLACE / REMOVE PACEMAKER       LUMBAR SPINE SURGERY   2021    NM MYOCAR PERF WALL MOTION   05/07/2009    bruce myoview; normal pattern of perfusion in all regions, low risk scan   PACEMAKER INSERTION   03/30/2009    for CHB; Medtronic Adapta ADDRL1 - Dr. Claudia Desanctis   TRANSTHORACIC ECHOCARDIOGRAM   09/05/2010    EF=>55%; mild MR; trace pulm valve regurg    TUBAL LIGATION   03/07/2000      Active Medications  No outpatient medications have been marked as taking for the 06/13/22 encounter (Telephone) with Darene Lamer, LPN.        Allergies:   Amoxicillin    Social History         Tobacco Use   Smoking status: Heavy Smoker      Packs/day: 1.50      Years: 44.00      Total pack years: 66.00      Types: Cigarettes   Smokeless tobacco: Never  Vaping Use   Vaping Use: Never used  Substance Use Topics   Alcohol use: Yes      Alcohol/week: 3.0 standard drinks of alcohol      Types: 3 Standard drinks or equivalent per week      Comment: social   Drug use:  No      Family Hx: The patient's family history includes Alcohol abuse in her father; Deep vein thrombosis in her brother; Diabetes in her father and mother; Hypertension in her father; Stroke in her father. There is no history of Colon cancer, Colon polyps, Esophageal cancer, Rectal cancer, or Stomach cancer.   ROS:   Please see the history of present illness.      All other systems reviewed and are negative.     Prior CV studies:   The following studies were reviewed today:   Pacemaker alert today showing device with less than 1 month until RRT   Labs/Other Tests and Data Reviewed:     EKG:  An ECG dated 05/12/2022 was personally reviewed today and demonstrated:  Atrial sensed (sinus), ventricular paced rhythm with positive R wave in lead V1   Recent Labs: No results found for requested labs within last 365 days.    Recent Lipid Panel Labs (Brief)        Lab Results  Component Value Date/Time    CHOL   03/29/2009 07:30 AM      144        ATP III CLASSIFICATION:  <200      mg/dL   Desirable  161-096  mg/dL   Borderline High  >=045    mg/dL   High           TRIG 409 03/29/2009 07:30 AM    HDL 50 03/29/2009 07:30 AM    CHOLHDL 2.9 03/29/2009 07:30 AM    LDLCALC   03/29/2009 07:30 AM      69        Total Cholesterol/HDL:CHD Risk Coronary Heart Disease Risk Table                     Men   Women  1/2 Average Risk   3.4   3.3  Average Risk       5.0   4.4  2 X Average Risk   9.6   7.1  3 X Average Risk  23.4   11.0        Use the calculated Patient Ratio above and the CHD Risk Table to determine the patient's CHD Risk.        ATP III CLASSIFICATION (LDL):  <100     mg/dL   Optimal  811-914  mg/dL   Near or Above                    Optimal  130-159  mg/dL   Borderline  782-956  mg/dL   High  >213     mg/dL   Very High           Wt Readings from Last 3 Encounters:  05/12/22 82.3 kg  09/12/21 89.4 kg  08/29/21 89.4 kg      Risk Assessment/Calculations:           Objective:     BP (!) 142/93   Pulse (!) 104   Ht 5' 6.5" (1.689 m)   Wt 193 lb (87.5 kg)   LMP 06/15/2013   SpO2 95%   BMI 30.68 kg/m     General: Alert, oriented x3, no distress, healthy PM site Head: no evidence of trauma, PERRL, EOMI, no exophtalmos or lid lag, no myxedema, no xanthelasma; normal ears, nose and oropharynx Neck: normal jugular venous pulsations and no hepatojugular reflux; brisk carotid pulses without delay and no carotid bruits Chest: clear to  auscultation, no signs of consolidation by percussion or palpation, normal fremitus, symmetrical and full respiratory excursions Cardiovascular: normal position and quality of the apical impulse, regular rhythm, normal first and second heart sounds, no murmurs, rubs or gallops Abdomen: no tenderness or distention, no masses by palpation, no abnormal pulsatility or arterial bruits, normal bowel sounds, no hepatosplenomegaly Extremities: no clubbing, cyanosis or edema; 2+ radial, ulnar and brachial pulses  bilaterally; 2+ right femoral, posterior tibial and dorsalis pedis pulses; 2+ left femoral, posterior tibial and dorsalis pedis pulses; no subclavian or femoral bruits Neurological: grossly nonfocal Psych: Normal mood and affect   ASSESSMENT & PLAN:     Pacemaker dependent patient with complete heart block and unexpectedly rapid battery depletion this year, anticipate RRT in less than a month.  Will bring her in for pacemaker generator change out.  Discussed the procedure in detail today including possible complications, in particular risk of infection.  Will benefit from an Aigis pouch. This procedure has been fully reviewed with the patient and written informed consent has been obtained.      Patient Instructions  Medication Instructions:  No changes *If you need a refill on your cardiac medications before your next appointment, please call your pharmacy*   Lab Work: Please send [place] your lab work from PCP- especially Lipid panel If you have labs (blood work) drawn today and your tests are completely normal, you will receive your results only by: MyChart Message (if you have MyChart) OR A paper copy in the mail If you have any lab test that is abnormal or we need to change your treatment, we will call you to review the results.  Follow-Up: At [facility], you and your health needs are our priority.  As part of our continuing mission to provide you with exceptional heart care, we have created designated Provider Care Teams.  These Care Teams include your primary Cardiologist (physician) and Advanced Practice Providers (APPs -  Physician Assistants and Nurse Practitioners) who all work together to provide you with the care you need, when you need it.  We recommend signing up for the patient portal called "MyChart".  Sign up information is provided on this After Visit Summary.  MyChart is used to connect with patients for Virtual Visits (Telemedicine).  Patients are able to view  lab/test results, encounter notes, upcoming appointments, etc.  Non-urgent messages can be sent to your provider as well.   To learn more about what you can do with MyChart, go to [email or web address].    Your next appointment:   1 year(s)  Provider:   Thurmon Fair, MD            Medication Adjustments/Labs and Tests Ordered: Current medicines are reviewed at length with the patient today.  Concerns regarding medicines are outlined above.    Tests Ordered:    Orders Placed This Encounter  Procedures   CBC   Basic metabolic panel      Medication Changes: No orders of the defined types were placed in this encounter.     Follow Up: Pacemaker generator change out Monday, December 18   Signed, Thurmon Fair, MD  06/13/2022 1:16 PM    [facility]

## 2022-10-09 NOTE — Patient Instructions (Signed)
Medication Instructions:  No changes *If you need a refill on your cardiac medications before your next appointment, please call your pharmacy*   Lab Work: Please send [place] your lab work from PCP- especially Lipid panel If you have labs (blood work) drawn today and your tests are completely normal, you will receive your results only by: [place] (if you have MyChart) OR A paper copy in the mail If you have any lab test that is abnormal or we need to change your treatment, we will call you to review the results.  Follow-Up: At [facility], you and your health needs are our priority.  As part of our continuing mission to provide you with exceptional heart care, we have created designated Provider Care Teams.  These Care Teams include your primary Cardiologist (physician) and Advanced Practice Providers (APPs -  Physician Assistants and Nurse Practitioners) who all work together to provide you with the care you need, when you need it.  We recommend signing up for the patient portal called "MyChart".  Sign up information is provided on this After Visit Summary.  MyChart is used to connect with patients for Virtual Visits (Telemedicine).  Patients are able to view lab/test results, encounter notes, upcoming appointments, etc.  Non-urgent messages can be sent to your provider as well.   To learn more about what you can do with MyChart, go to [email or web address].    Your next appointment:   1 year(s)  Provider:   Sanda Klein, MD

## 2022-10-20 ENCOUNTER — Telehealth: Payer: Self-pay | Admitting: Cardiovascular Disease

## 2022-10-20 NOTE — Telephone Encounter (Signed)
*  STAT* If patient is at the pharmacy, call can be transferred to refill team.   1. Which medications need to be refilled? (please list name of each medication and dose if known) metoprolol succinate (TOPROL-XL) 50 MG 24 hr tablet  2. Which pharmacy/location (including street and city if local pharmacy) is medication to be sent to? [id number] - [place], [place] - [address]  3. Do they need a 30 day or 90 day supply? 90 day  

## 2022-10-21 ENCOUNTER — Other Ambulatory Visit: Payer: Self-pay

## 2022-10-21 MED ORDER — METOPROLOL SUCCINATE ER 50 MG PO TB24: ORAL_TABLET | ORAL | 3 refills | Status: DC

## 2022-10-21 NOTE — Telephone Encounter (Signed)
Medication has been refilled and sent to the pharmacy  

## 2022-11-04 ENCOUNTER — Telehealth: Payer: Self-pay | Admitting: Cardiovascular Disease

## 2022-11-04 NOTE — Telephone Encounter (Signed)
Spoke with Dr. Salena Saner: She should not. Also should not use the meta detector wand. She is pacemaker dependent. OK to go through [employer] at airport (the one where you enter the cabin and hold arms above head)   Called pt to relay Dr. Renaye Rakers message.  Pt made aware what she should avoid. She verbalized understanding no further questions at this time.

## 2022-11-04 NOTE — Telephone Encounter (Signed)
Pt would like a callback to let her know if its okay for her to go through metal detectors. Please advise

## 2022-11-05 NOTE — Progress Notes (Signed)
Remote pacemaker transmission.   

## 2022-11-05 NOTE — Addendum Note (Signed)
Addended by: Elease Etienne A on: 11/05/2022 11:19 AM   Modules accepted: Level of Service

## 2022-11-24 ENCOUNTER — Ambulatory Visit (HOSPITAL_COMMUNITY)
Admission: RE | Admit: 2022-11-24 | Discharge: 2022-11-24 | Disposition: A | Discharge status: Home / Self Care | Payer: Commercial Managed Care - HMO | Source: Ambulatory Visit | Attending: Internal Medicine | Admitting: Internal Medicine

## 2022-11-24 ENCOUNTER — Ambulatory Visit (HOSPITAL_COMMUNITY)
Admission: RE | Admit: 2022-11-24 | Discharge: 2022-11-24 | Disposition: A | Discharge status: Home / Self Care | Payer: Commercial Managed Care - HMO | Source: Ambulatory Visit | Attending: Physician Assistant | Admitting: Physician Assistant

## 2022-11-24 DIAGNOSIS — M5416 Radiculopathy, lumbar region: Secondary | ICD-10-CM | POA: Diagnosis present

## 2022-11-24 DIAGNOSIS — G249 Dystonia, unspecified: Secondary | ICD-10-CM | POA: Diagnosis present

## 2022-11-24 NOTE — Progress Notes (Signed)
Per order,  Changed device to 15bpm above presenting rate in MRI sure scan.  Tachy therapies off if applicable.  Will program back to regular settings after MRI complete and send transmission.

## 2023-01-01 ENCOUNTER — Ambulatory Visit (INDEPENDENT_AMBULATORY_CARE_PROVIDER_SITE_OTHER): Payer: Commercial Managed Care - HMO

## 2023-01-01 DIAGNOSIS — I442 Atrioventricular block, complete: Secondary | ICD-10-CM

## 2023-01-01 LAB — CUP PACEART REMOTE DEVICE CHECK
Battery Remaining Longevity: 141 mo
Battery Voltage: 3.13 V
Brady Statistic AP VP Percent: 0.7 %
Brady Statistic AP VS Percent: 0 %
Brady Statistic AS VP Percent: 99.26 %
Brady Statistic AS VS Percent: 0.04 %
Brady Statistic RA Percent Paced: 0.75 %
Brady Statistic RV Percent Paced: 99.96 %
Date Time Interrogation Session: 20240626210620
Implantable Lead Connection Status: 753985
Implantable Lead Connection Status: 753985
Implantable Lead Implant Date: 20100924
Implantable Lead Implant Date: 20100924
Implantable Lead Location: 753859
Implantable Lead Location: 753860
Implantable Lead Model: 5076
Implantable Lead Model: 5076
Implantable Pulse Generator Implant Date: 20231218
Lead Channel Impedance Value: 399 Ohm
Lead Channel Impedance Value: 437 Ohm
Lead Channel Impedance Value: 456 Ohm
Lead Channel Impedance Value: 475 Ohm
Lead Channel Pacing Threshold Amplitude: 0.5 V
Lead Channel Pacing Threshold Amplitude: 0.625 V
Lead Channel Pacing Threshold Pulse Width: 0.4 ms
Lead Channel Pacing Threshold Pulse Width: 0.4 ms
Lead Channel Sensing Intrinsic Amplitude: 17.5 mV
Lead Channel Sensing Intrinsic Amplitude: 17.5 mV
Lead Channel Sensing Intrinsic Amplitude: 3 mV
Lead Channel Sensing Intrinsic Amplitude: 3 mV
Lead Channel Setting Pacing Amplitude: 1.5 V
Lead Channel Setting Pacing Amplitude: 2 V
Lead Channel Setting Pacing Pulse Width: 0.4 ms
Lead Channel Setting Sensing Sensitivity: 4 mV
Zone Setting Status: 755011

## 2023-01-20 NOTE — Progress Notes (Signed)
 Remote pacemaker transmission.   

## 2023-01-25 ENCOUNTER — Other Ambulatory Visit: Payer: Self-pay | Admitting: Cardiovascular Disease

## 2023-03-11 DIAGNOSIS — M79673 Pain in unspecified foot: Secondary | ICD-10-CM | POA: Insufficient documentation

## 2023-03-11 DIAGNOSIS — Z87410 Personal history of cervical dysplasia: Secondary | ICD-10-CM | POA: Insufficient documentation

## 2023-03-11 DIAGNOSIS — R202 Paresthesia of skin: Secondary | ICD-10-CM | POA: Insufficient documentation

## 2023-03-17 ENCOUNTER — Ambulatory Visit: Payer: Commercial Managed Care - HMO | Admitting: Neurology

## 2023-03-17 ENCOUNTER — Encounter: Payer: Self-pay | Admitting: Neurology

## 2023-03-17 VITALS — BP 150/94 | HR 65 | Ht 66.5 in | Wt 204.0 lb

## 2023-03-17 DIAGNOSIS — M62838 Other muscle spasm: Secondary | ICD-10-CM | POA: Insufficient documentation

## 2023-03-17 DIAGNOSIS — M5440 Lumbago with sciatica, unspecified side: Secondary | ICD-10-CM | POA: Diagnosis not present

## 2023-03-17 DIAGNOSIS — G8929 Other chronic pain: Secondary | ICD-10-CM | POA: Diagnosis not present

## 2023-03-17 NOTE — Progress Notes (Signed)
Chief Complaint  Patient presents with   New Patient (Initial Visit)    Rm14, alone. NP paper referral for Radiculopathy - Chronic pain: pt stated that she has hx of back issues, lots of back injection over 6 years or so and surgery. She now has pain in right ankle that shot to foot cause foot to "jot to the left absolutely unnaturally very painful and 1-2 times per night when laying down mostly" when she would get up and try to walk it out it would subside and has decreased in frequency but has also began happening in left foot but that is minor compared to the right foot       ASSESSMENT AND PLAN  Mandy Sellers is a 62 y.o. female  Right foot spasm Known history of lumbar degenerative disease,  MRI of lumbar in May 2024 showed multilevel degenerative disease, evidence of moderate right articular recess stenosis at L4-5, with mild foraminal narrowing, can potentially explain her frequent right foot muscle spasm, overall improving, I have provided gabapentin as needed, she decided to hold off, suggested frequent muscle stretching,  Only return to clinic for worsening symptoms   DIAGNOSTIC DATA (LABS, IMAGING, TESTING) - I reviewed patient records, labs, notes, testing and imaging myself where available.   MEDICAL HISTORY:  Mandy Sellers is 62 year old female, seen in request by her primary care from [facility] Dr. Margaretann Loveless, Electa Sniff P, for evaluation of right foot painful muscle spasm, initial evaluation was on March 17, 2023  I reviewed and summarized the referring note. PMHx. HTN Restless leg syndrome HLD DM Depression COPD Obesity Smoker 1.5 ppd History of lumbar decompression surgery for low back pain, left lumbar radiculopathy Pacemaker for heart block, MRI compatible.  She had a history of lumbar decompression surgery by Dr. Wynetta Emery in 2021, presenting with severe low back pain, radicular pain to left lower extremity, surgery did help her symptoms, still have recurrent low  back pain  Since 2023, she began to have frequent right foot muscle spasm, she describes sudden onset painful ankle plantarflexion, muscle spasm, often wake her up from sleep, lasting for couple minutes, she has to get up laying her food carefully on the floor, to wait for the muscle spasm go away, that is worse, it can happen couple times at night  She had recent improvement, unsure what caused the improvement, but she had worsening right-sided low back pain, recently received epidural injection by pain management at [facility]  Personally reviewed MRI of lumbar in May 2024, multilevel degenerative changes, L4-5 moderate right articular recess stenosis moderate left and mild right foraminal stenosis, L2-3 moderate right mild left foraminal stenosis there was no significant canal stenosis  MRI of the brain showed mild small vessel disease PHYSICAL EXAM:   Vitals:   03/17/23 0834 03/17/23 0839  BP: (!) 159/90 (!) 150/94  Pulse: 65   Weight: 204 lb (92.5 kg)   Height: 5' 6.5" (1.689 m)    Body mass index is 32.43 kg/m.  PHYSICAL EXAMNIATION:  Gen: NAD, conversant, well nourised, well groomed                     Cardiovascular: Regular rate rhythm, no peripheral edema, warm, nontender. Eyes: Conjunctivae clear without exudates or hemorrhage Neck: Supple, no carotid bruits. Pulmonary: Clear to auscultation bilaterally   NEUROLOGICAL EXAM:  MENTAL STATUS: Speech/cognition: Awake, alert, oriented to history taking and casual conversation CRANIAL NERVES: CN II: Visual fields are full to confrontation. Pupils  are round equal and briskly reactive to light. CN III, IV, VI: extraocular movement are normal. No ptosis. CN V: Facial sensation is intact to light touch CN VII: Face is symmetric with normal eye closure  CN VIII: Hearing is normal to causal conversation. CN IX, X: Phonation is normal. CN XI: Head turning and shoulder shrug are intact  MOTOR: There is no pronator  drift of out-stretched arms. Muscle bulk and tone are normal. Muscle strength is normal.  REFLEXES: Reflexes are 2+ and symmetric at the biceps, triceps, knees, and ankles. Plantar responses are flexor.  SENSORY: Intact to light touch, pinprick and vibratory sensation are intact in fingers and toes.  COORDINATION: There is no trunk or limb dysmetria noted.  GAIT/STANCE: Posture is normal. Gait is steady with normal steps, base, arm swing, and turning. Heel and toe walking are normal. Tandem gait is normal.  Romberg is absent.  REVIEW OF SYSTEMS:  Full 14 system review of systems performed and notable only for as above All other review of systems were negative.   ALLERGIES: Allergies  Allergen Reactions   Amoxicillin Itching    HOME MEDICATIONS: Current Outpatient Medications  Medication Sig Dispense Refill   albuterol (VENTOLIN HFA) 108 (90 Base) MCG/ACT inhaler Inhale 1-2 puffs into the lungs every 4 (four) hours as needed for wheezing or shortness of breath. 8 g 6   aspirin EC 81 MG tablet Take 81 mg by mouth at bedtime.     Cholecalciferol (VITAMIN D-3) 1000 UNITS CAPS Take 1,000 Units by mouth daily.     clonazePAM (KLONOPIN) 1 MG tablet Take 1 mg by mouth daily as needed (Restless legg).     hydrochlorothiazide (MICROZIDE) 12.5 MG capsule Take 1 capsule (12.5 mg total) by mouth daily. 90 capsule 3   metFORMIN (GLUCOPHAGE-XR) 500 MG 24 hr tablet Take 500 mg by mouth 2 (two) times daily.     metoprolol succinate (TOPROL-XL) 50 MG 24 hr tablet Take with or immediately following a meal. 90 tablet 3   Multiple Vitamin (MULTIVITAMIN WITH MINERALS) TABS tablet Take 1 tablet by mouth daily. 50+     Omega-3 Fatty Acids (FISH OIL) 1000 MG CAPS Take 2,000 mg by mouth daily.     rosuvastatin (CRESTOR) 10 MG tablet TAKE 1 TABLET BY MOUTH EVERY DAY 90 tablet 3   venlafaxine XR (EFFEXOR-XR) 75 MG 24 hr capsule Take 75 mg by mouth every evening.     No current facility-administered  medications for this visit.    PAST MEDICAL HISTORY: Past Medical History:  Diagnosis Date   CHB (complete heart block) (HCC) 08/09/2013   COPD (chronic obstructive pulmonary disease) (HCC)    Depression    Diabetes mellitus without complication (HCC)    HTN (hypertension) 08/09/2013   Hypertension    Obesity (BMI 30.0-34.9) 08/09/2013   Pacemaker    dual-chmber for CHB in 2010   Tobacco abuse     PAST SURGICAL HISTORY: Past Surgical History:  Procedure Laterality Date   COLONOSCOPY  2012   Dr.Brodie   DILATATION & CURRETTAGE/HYSTEROSCOPY WITH RESECTOCOPE N/A 11/10/2013   Procedure: DILATATION & CURETTAGE/HYSTEROSCOPY WITH MYOSURE;  Surgeon: Juluis Mire, MD;  Location: [facility];  Service: Gynecology;  Laterality: N/A;   INCONTINENCE SURGERY  03/07/2000   INSERT / REPLACE / REMOVE PACEMAKER     LUMBAR SPINE SURGERY  2021   NM MYOCAR PERF WALL MOTION  05/07/2009   bruce myoview; normal pattern of perfusion in all regions, low risk scan  PACEMAKER INSERTION  03/30/2009   for CHB; [employer] ADDRL1 - Dr. Claudia Desanctis   PPM GENERATOR CHANGEOUT N/A 06/23/2022   Procedure: PPM GENERATOR CHANGEOUT;  Surgeon: Thurmon Fair, MD;  Location: [facility];  Service: Cardiovascular;  Laterality: N/A;   TRANSTHORACIC ECHOCARDIOGRAM  09/05/2010   EF=>55%; mild MR; trace pulm valve regurg    TUBAL LIGATION  03/07/2000    FAMILY HISTORY: Family History  Problem Relation Age of Onset   Diabetes Mother    Diabetes Father    Alcohol abuse Father    Hypertension Father    Stroke Father    Deep vein thrombosis Brother    Colon cancer Neg Hx    Colon polyps Neg Hx    Esophageal cancer Neg Hx    Rectal cancer Neg Hx    Stomach cancer Neg Hx     SOCIAL HISTORY: Social History   Socioeconomic History   Marital status: Widowed    Spouse name: Not on file   Number of children: 3   Years of education: master's   Highest education level: Master's degree (e.g., MA, MS,  MEng, MEd, MSW, MBA)  Occupational History    Employer: [employer]  Tobacco Use   Smoking status: Heavy Smoker    Current packs/day: 1.50    Average packs/day: 1.5 packs/day for 44.0 years (66.0 ttl pk-yrs)    Types: Cigarettes   Smokeless tobacco: Never  Vaping Use   Vaping status: Never Used  Substance and Sexual Activity   Alcohol use: Yes    Alcohol/week: 3.0 standard drinks of alcohol    Types: 3 Standard drinks or equivalent per week    Comment: social   Drug use: No   Sexual activity: Yes    Birth control/protection: None  Other Topics Concern   Not on file  Social History Narrative   Not on file   Social Determinants of Health   Financial Resource Strain: Not on file  Food Insecurity: Not on file  Transportation Needs: Not on file  Physical Activity: Not on file  Stress: Not on file  Social Connections: Not on file  Intimate Partner Violence: Not on file      Levert Feinstein, M.D. Ph.D.  [facility] [address] [place], [place] [address] Ph: [phone] Fax: [phone]  CC:  Marlyne Beards, PA-C [address] [place],  [place] [address]  Thana Ates, MD

## 2023-04-02 ENCOUNTER — Ambulatory Visit (INDEPENDENT_AMBULATORY_CARE_PROVIDER_SITE_OTHER): Payer: Commercial Managed Care - HMO

## 2023-04-02 DIAGNOSIS — I442 Atrioventricular block, complete: Secondary | ICD-10-CM | POA: Diagnosis not present

## 2023-04-02 LAB — CUP PACEART REMOTE DEVICE CHECK
Battery Remaining Longevity: 137 mo
Battery Voltage: 3.07 V
Brady Statistic AP VP Percent: 1.11 %
Brady Statistic AP VS Percent: 0 %
Brady Statistic AS VP Percent: 96.29 %
Brady Statistic AS VS Percent: 2.6 %
Brady Statistic RA Percent Paced: 1.18 %
Brady Statistic RV Percent Paced: 97.4 %
Date Time Interrogation Session: 20240925204931
Implantable Lead Connection Status: 753985
Implantable Lead Connection Status: 753985
Implantable Lead Implant Date: 20100924
Implantable Lead Implant Date: 20100924
Implantable Lead Location: 753859
Implantable Lead Location: 753860
Implantable Lead Model: 5076
Implantable Lead Model: 5076
Implantable Pulse Generator Implant Date: 20231218
Lead Channel Impedance Value: 380 Ohm
Lead Channel Impedance Value: 418 Ohm
Lead Channel Impedance Value: 418 Ohm
Lead Channel Impedance Value: 418 Ohm
Lead Channel Pacing Threshold Amplitude: 0.375 V
Lead Channel Pacing Threshold Amplitude: 0.625 V
Lead Channel Pacing Threshold Pulse Width: 0.4 ms
Lead Channel Pacing Threshold Pulse Width: 0.4 ms
Lead Channel Sensing Intrinsic Amplitude: 19.125 mV
Lead Channel Sensing Intrinsic Amplitude: 19.125 mV
Lead Channel Sensing Intrinsic Amplitude: 3.25 mV
Lead Channel Sensing Intrinsic Amplitude: 3.25 mV
Lead Channel Setting Pacing Amplitude: 1.5 V
Lead Channel Setting Pacing Amplitude: 2 V
Lead Channel Setting Pacing Pulse Width: 0.4 ms
Lead Channel Setting Sensing Sensitivity: 4 mV
Zone Setting Status: 755011

## 2023-04-10 NOTE — Progress Notes (Signed)
Remote pacemaker transmission.   

## 2023-04-16 ENCOUNTER — Other Ambulatory Visit: Payer: Self-pay | Admitting: Medical Genetics

## 2023-04-16 DIAGNOSIS — Z006 Encounter for examination for normal comparison and control in clinical research program: Secondary | ICD-10-CM

## 2023-06-11 ENCOUNTER — Ambulatory Visit
Admission: RE | Admit: 2023-06-11 | Discharge: 2023-06-11 | Disposition: A | Discharge status: Home / Self Care | Payer: Commercial Managed Care - HMO | Source: Ambulatory Visit | Attending: Internal Medicine | Admitting: Internal Medicine

## 2023-06-11 DIAGNOSIS — Z87891 Personal history of nicotine dependence: Secondary | ICD-10-CM

## 2023-06-11 DIAGNOSIS — F17210 Nicotine dependence, cigarettes, uncomplicated: Secondary | ICD-10-CM

## 2023-06-11 DIAGNOSIS — Z122 Encounter for screening for malignant neoplasm of respiratory organs: Secondary | ICD-10-CM

## 2023-06-25 ENCOUNTER — Telehealth: Payer: Self-pay | Admitting: Acute Care

## 2023-06-25 NOTE — Telephone Encounter (Signed)
Spoke with Diane from [facility] radiology for the following call report:  IMPRESSION: 1. Subsolid nodule in the right lower lobe with an enlarging solid component. Lung-RADS 4B, suspicious. Additional imaging evaluation or consultation with Pulmonology or Thoracic Surgery recommended. These results will be called to the ordering clinician or representative by the Radiologist Assistant, and communication documented in the PACS or [employer]. 2. 2.1 cm low-attenuation left thyroid nodule. Recommend thyroid ultrasound. (Ref: J Am Coll Radiol. 2015 Feb;12(2): 143-50). 3.  Aortic atherosclerosis (ICD10-I70.0). 4.  Emphysema (ICD10-J43.9).

## 2023-06-25 NOTE — Telephone Encounter (Signed)
Diane calling with call report. For CT scan. Diane phone number is [phone].

## 2023-06-26 ENCOUNTER — Other Ambulatory Visit: Payer: Self-pay | Admitting: Acute Care

## 2023-06-26 DIAGNOSIS — R911 Solitary pulmonary nodule: Secondary | ICD-10-CM

## 2023-06-26 NOTE — Progress Notes (Signed)
I have called the patient with the results of her low-dose screening CT. Her scan was read as a lung RADS 4B, suspicious The part solid nodule in the right lower lobe has enlarged now measuring 12.4 mm with an internal solid component of 8 mm.  Previously the solid component measured 3.5 mm. Plan will be for a PET scan and then follow-up in the office with me 2 weeks after to review the results and determine next steps in regard to the plan of care. Patient is in agreement with this plan. Patient also has a 1 cm low-attenuation left thyroid nodule and recommendation is for ultrasound. The recommendation is for this to be ultra sounded to better evaluate it.  I will discuss this finding with her when she comes to the office  after the PET scan , to see if we need to send her for [place]. ( We will look for PET avidity)  Jonita Albee and Revonda Standard, please fax results to PCP and let them know plan is for PET with follow up to review results. Thanks so much

## 2023-06-29 NOTE — Telephone Encounter (Signed)
Mandy Ngo, NP at 06/26/2023  4:17 PM  Status: Signed  I have called the patient with the results of her low-dose screening CT. Her scan was read as a lung RADS 4B, suspicious The part solid nodule in the right lower lobe has enlarged now measuring 12.4 mm with an internal solid component of 8 mm.  Previously the solid component measured 3.5 mm. Plan will be for a PET scan and then follow-up in the office with me 2 weeks after to review the results and determine next steps in regard to the plan of care. Patient is in agreement with this plan. Patient also has a 1 cm low-attenuation left thyroid nodule and recommendation is for ultrasound. The recommendation is for this to be ultra sounded to better evaluate it.  I will discuss this finding with her when she comes to the office  after the PET scan , to see if we need to send her for [place]. ( We will look for PET avidity)   Mandy Sellers and Revonda Standard, please fax results to PCP and let them know plan is for PET with follow up to review results. Thanks so much    Copied from orders only encounter from 06/26/2023.   Results/ plans faxed to PCP. Will await PET results.

## 2023-07-02 ENCOUNTER — Ambulatory Visit (INDEPENDENT_AMBULATORY_CARE_PROVIDER_SITE_OTHER): Payer: Commercial Managed Care - HMO

## 2023-07-02 DIAGNOSIS — I442 Atrioventricular block, complete: Secondary | ICD-10-CM | POA: Diagnosis not present

## 2023-07-02 LAB — CUP PACEART REMOTE DEVICE CHECK
Battery Remaining Longevity: 134 mo
Battery Voltage: 3.04 V
Brady Statistic AP VP Percent: 0.94 %
Brady Statistic AP VS Percent: 0 %
Brady Statistic AS VP Percent: 98.75 %
Brady Statistic AS VS Percent: 0.31 %
Brady Statistic RA Percent Paced: 1.01 %
Brady Statistic RV Percent Paced: 99.69 %
Date Time Interrogation Session: 20241226050038
Implantable Lead Connection Status: 753985
Implantable Lead Connection Status: 753985
Implantable Lead Implant Date: 20100924
Implantable Lead Implant Date: 20100924
Implantable Lead Location: 753859
Implantable Lead Location: 753860
Implantable Lead Model: 5076
Implantable Lead Model: 5076
Implantable Pulse Generator Implant Date: 20231218
Lead Channel Impedance Value: 399 Ohm
Lead Channel Impedance Value: 418 Ohm
Lead Channel Impedance Value: 418 Ohm
Lead Channel Impedance Value: 437 Ohm
Lead Channel Pacing Threshold Amplitude: 0.375 V
Lead Channel Pacing Threshold Amplitude: 0.625 V
Lead Channel Pacing Threshold Pulse Width: 0.4 ms
Lead Channel Pacing Threshold Pulse Width: 0.4 ms
Lead Channel Sensing Intrinsic Amplitude: 13.875 mV
Lead Channel Sensing Intrinsic Amplitude: 13.875 mV
Lead Channel Sensing Intrinsic Amplitude: 5.125 mV
Lead Channel Sensing Intrinsic Amplitude: 5.125 mV
Lead Channel Setting Pacing Amplitude: 1.5 V
Lead Channel Setting Pacing Amplitude: 2 V
Lead Channel Setting Pacing Pulse Width: 0.4 ms
Lead Channel Setting Sensing Sensitivity: 4 mV
Zone Setting Status: 755011

## 2023-07-06 ENCOUNTER — Telehealth: Payer: Self-pay | Admitting: Acute Care

## 2023-07-06 NOTE — Telephone Encounter (Signed)
Patient states has pacemaker. States someone from pacemaker company needs to be at PET scan. Patient phone number is [phone]

## 2023-07-07 NOTE — Telephone Encounter (Signed)
I will forward to triage I have never had this issue come up

## 2023-07-07 NOTE — Telephone Encounter (Signed)
 PT calling again about pace maker. I gave her number to [facility] saying they would be bale to answer as well.   Also, her ins changes the first of the year. She needs to know how that will work with her pre-auth for this upcoming PET Scan.  New Ins: [employer] Member#: [id number]  [id number] is her #.

## 2023-07-10 ENCOUNTER — Encounter (HOSPITAL_COMMUNITY): Payer: Self-pay

## 2023-07-10 ENCOUNTER — Encounter (HOSPITAL_COMMUNITY): Payer: Commercial Managed Care - HMO

## 2023-07-10 ENCOUNTER — Encounter (HOSPITAL_COMMUNITY)
Admission: RE | Admit: 2023-07-10 | Discharge: 2023-07-10 | Disposition: A | Discharge status: Home / Self Care | Payer: 59 | Source: Ambulatory Visit | Attending: Acute Care | Admitting: Acute Care

## 2023-07-10 DIAGNOSIS — R911 Solitary pulmonary nodule: Secondary | ICD-10-CM | POA: Insufficient documentation

## 2023-07-10 LAB — GLUCOSE, CAPILLARY: Glucose-Capillary: 141 mg/dL — ABNORMAL HIGH (ref 70–99)

## 2023-07-10 MED ORDER — FLUDEOXYGLUCOSE F - 18 (FDG) INJECTION
10.2000 | Freq: Once | INTRAVENOUS | Status: AC
  Administered 2023-07-10: 10.15 via INTRAVENOUS

## 2023-07-17 ENCOUNTER — Ambulatory Visit (INDEPENDENT_AMBULATORY_CARE_PROVIDER_SITE_OTHER): Payer: 59 | Admitting: Acute Care

## 2023-07-17 ENCOUNTER — Encounter: Payer: Self-pay | Admitting: Acute Care

## 2023-07-17 ENCOUNTER — Telehealth: Payer: Self-pay

## 2023-07-17 VITALS — BP 124/70 | HR 99 | Temp 97.8°F | Ht 66.0 in | Wt 201.4 lb

## 2023-07-17 DIAGNOSIS — R911 Solitary pulmonary nodule: Secondary | ICD-10-CM | POA: Diagnosis not present

## 2023-07-17 DIAGNOSIS — F17200 Nicotine dependence, unspecified, uncomplicated: Secondary | ICD-10-CM

## 2023-07-17 DIAGNOSIS — Z7984 Long term (current) use of oral hypoglycemic drugs: Secondary | ICD-10-CM

## 2023-07-17 DIAGNOSIS — E1169 Type 2 diabetes mellitus with other specified complication: Secondary | ICD-10-CM

## 2023-07-17 DIAGNOSIS — I442 Atrioventricular block, complete: Secondary | ICD-10-CM

## 2023-07-17 DIAGNOSIS — E041 Nontoxic single thyroid nodule: Secondary | ICD-10-CM

## 2023-07-17 DIAGNOSIS — E669 Obesity, unspecified: Secondary | ICD-10-CM

## 2023-07-17 NOTE — Telephone Encounter (Signed)
 Called PCP's office to review recent incidental findings on PET results. Per Alexandria Lodge, NP patient will need their office to schedule her thyroid US and biopsy if indicated. Requested Dr Sallyanne Kuster nurse Margy Clarks to call back and confirm message received.

## 2023-07-17 NOTE — Patient Instructions (Addendum)
 It is good to see you today. The PET scan showed very low level hypermetabolic activity of the lung nodule we have been following. Per radiology, we could biopsy now, as even low level hypermetabolism can be seen in adenocarcinoma, or we can do short term follow up. You would prefer to move forward with biopsy now. I have ordered the bronchoscopy. You will have a letter in hand when you leave today with all the details and information regarding her bronchoscopy. We have called your primary care doctor's office and left a message with her nurse regarding ordering an ultrasound of the thyroid that was noted to be enlarged on your initial lung cancer screening. If you do not get a call by Monday or Tuesday of next week please call the office and follow-up with them regarding scheduling an ultrasound to determine if thyroid nodule needs biopsy. I will let you know if Dr. Shelah feels he needs any additional imaging prior to the procedure. We have noted that this procedure will not be done until after August 08, 2023 when your Medicare has taken effect. You will follow-up with me 1 week after the procedure to ensure you have done well and to make sure we reviewed the results of the biopsy. Please call if you need anything sooner Please contact office for sooner follow up if symptoms do not improve or worsen or seek emergency care

## 2023-07-17 NOTE — H&P (View-Only) (Signed)
History of Present Illness Mandy Sellers is a 63 y.o. female current every day heavy smoker followed by the lung cancer screening program here for follow up of abnormal imaging study concerning for bronchogenic lung cancer.. Additional medical history includes Complete heart block since the age of 9, pacemaker dependent since 2010, COPD, and Diabetes   07/17/2023 Pt.presents for follow up after PET scan to better evaluate an abnormal lung cancer screening scan done 06/25/2023 for a 4 B finding. There was notation of a  Subsolid nodule in the right lower lobe with an enlarging solid component. She also has a 2.1 mm low attenuation left thyroid nodule that needs thyroid US.  We have discussed her PET scan results. The nodule of concern has very low avidity on PET.  SUV was 1.1 and while this is below the level of metabolic activity expected for adenocarcinoma we know that low-grade SUVs can be a low-grade malignancy.  Patient would rather move forward with biopsy then managed with a watchful waiting approach.  The solid component of the partially solid nodule has been growing consistency over the last several scans. There was also notation of an enlarged thyroid nodule which was not PET avid on the scan however warrants ultrasound evaluation.  We have messaged the patient's primary care physician to see if they will manage ordering the ultrasound of the thyroid and then any follow-up is necessary, for example if the thyroid nodule meets size criteria for biopsy. Patient will become eligible for Medicare on August 08, 2023.  While she would like to move forward with bronchoscopy and biopsy she would prefer this to be after August 08, 2023 so that Medicare covers this for her.  We have placed the orders accordingly. Patient is continuing to smoke.  We had a long discussion today about the fact that she needs to quit smoking.  She verbalized understanding she states she has all necessary nicotine  replacement and Chantix at home.  These were prescribed by her primary care physician.  She understands she needs to go ahead and initiate use.  Test Results: 07/10/2023 PET  The dominant right lower lobe subsolid nodule measures 1.2 by 0.6 cm and has maximum SUV of 1.1. This is below the level of metabolic activity expected for adenocarcinoma, although low-grade adenocarcinoma can half low activity. If attempts at tissue diagnosis are not currently indicated, careful CT surveillance of this nodule would be recommended to surveil the lesion for growth.  06/11/2023 LDCT Subsolid nodule in the right lower lobe with an enlarging solid component. Lung-RADS 4B, suspicious. Additional imaging evaluation or consultation with Pulmonology or Thoracic Surgery recommended. These results will be called to the ordering clinician or representative by the Radiologist Assistant, and communication documented in the PACS or [employer].  2.1 cm low-attenuation left thyroid nodule. Recommend thyroid Ultrasound>  RADIOLOGY Lung cancer screening scan: Right lower lobe nodule, partial solid, solid component increased from 3.8 to 8 mm (06/25/2023) PET scan: Right lower lobe subsolid nodule, low avidity, below metabolic activity level expected for adenocarcinoma (07/10/2023) CT scan: Cardiomegaly, aortic atherosclerosis (06/11/2023)     Latest Ref Rng & Units 06/13/2022    3:36 PM 01/26/2020   12:45 PM 12/15/2019    4:06 PM  CBC  WBC 3.4 - 10.8 x10E3/uL 9.9  10.4  8.6   Hemoglobin 11.1 - 15.9 g/dL 14.7  82.9  56.2   Hematocrit 34.0 - 46.6 % 43.1  46.4  44.4   Platelets 150 - 450 x10E3/uL  212  207.0  215        Latest Ref Rng & Units 06/13/2022    3:36 PM 12/15/2019    4:06 PM 05/24/2019    1:53 PM  BMP  Glucose 70 - 99 mg/dL 95  604  540   BUN 8 - 27 mg/dL 15  13  15    Creatinine 0.57 - 1.00 mg/dL 9.81  1.91  4.78   BUN/Creat Ratio 12 - 28 19  23     Sodium 134 - 144 mmol/L 138  142  137    Potassium 3.5 - 5.2 mmol/L 4.4  4.3  3.6   Chloride 96 - 106 mmol/L 100  101  102   CO2 20 - 29 mmol/L 24  22  24    Calcium 8.7 - 10.3 mg/dL 29.5  9.9  9.5     BNP No results found for: "BNP"  ProBNP    Component Value Date/Time   PROBNP 43.0 03/28/2009 2037    PFT    Component Value Date/Time   FEV1PRE 1.78 03/30/2020 1357   FEV1POST 1.81 03/30/2020 1357   FVCPRE 2.37 03/30/2020 1357   FVCPOST 2.38 03/30/2020 1357   TLC 5.61 03/30/2020 1357   DLCOUNC 19.47 03/30/2020 1357   PREFEV1FVCRT 75 03/30/2020 1357   PSTFEV1FVCRT 76 03/30/2020 1357    NM PET Image Initial (PI) Skull Base To Thigh Result Date: 07/13/2023 CLINICAL DATA:  Initial treatment strategy for lung nodule. EXAM: NUCLEAR MEDICINE PET SKULL BASE TO THIGH TECHNIQUE: 10.2 mCi F-18 FDG was injected intravenously. Full-ring PET imaging was performed from the skull base to thigh after the radiotracer. CT data was obtained and used for attenuation correction and anatomic localization. Fasting blood glucose: 141 mg/dl COMPARISON:  Chest CT 62/13/0865 FINDINGS: Mediastinal blood pool activity: SUV max 3.1 Liver activity: SUV max NA NECK: No significant abnormal hypermetabolic activity in this region. Incidental CT findings: Chronic bilateral maxillary and right greater than left ethmoid sinusitis. CHEST: The dominant right lower lobe subsolid nodule measures 1.2 by 0.6 cm on image 40 series 7 and has maximum SUV of 1.1. None of the other previously described smaller lesions demonstrates any appreciable metabolic activity. Incidental CT findings: Biapical pleuroparenchymal scarring. Dual lead pacer. Coronary, aortic arch, and branch vessel atherosclerotic vascular disease. ABDOMEN/PELVIS: No significant abnormal hypermetabolic activity in this region. Incidental CT findings: Atherosclerosis is present, including aortoiliac atherosclerotic disease. Sigmoid colon diverticulosis. SKELETON: No significant abnormal hypermetabolic activity  in this region. Incidental CT findings: Mild degenerative endplate sclerosis at L2-3. IMPRESSION: 1. The dominant right lower lobe subsolid nodule measures 1.2 by 0.6 cm and has maximum SUV of 1.1. This is below the level of metabolic activity expected for adenocarcinoma, although low-grade adenocarcinoma can half low activity. If attempts at tissue diagnosis are not currently indicated, careful CT surveillance of this nodule would be recommended to surveil the lesion for growth. 2. None of the other previously described smaller lesions demonstrates any appreciable metabolic activity. 3. Chronic bilateral maxillary and right greater than left ethmoid sinusitis. 4. Sigmoid colon diverticulosis. 5. Aortic atherosclerosis. Aortic Atherosclerosis (ICD10-I70.0). Electronically Signed   By: Gaylyn Rong M.D.   On: 07/13/2023 13:59   CUP PACEART REMOTE DEVICE CHECK Result Date: 07/02/2023 Scheduled remote reviewed. Normal device function.  There were 35 atrial arrhythmias detected, known PAF, AF burden is 0.1% of the time.  All episdoes were less than 5 minutes except 2 episode were 13.5 minutes and 25.5 minutes Next remote 91 days. ML, CVRS  Past medical hx Past Medical History:  Diagnosis Date   CHB (complete heart block) (HCC) 08/09/2013   COPD (chronic obstructive pulmonary disease) (HCC)    Depression    Diabetes mellitus without complication (HCC)    HTN (hypertension) 08/09/2013   Hypertension    Obesity (BMI 30.0-34.9) 08/09/2013   Pacemaker    dual-chmber for CHB in 2010   Tobacco abuse      Social History   Tobacco Use   Smoking status: Heavy Smoker    Current packs/day: 1.50    Average packs/day: 1.5 packs/day for 44.0 years (66.0 ttl pk-yrs)    Types: Cigarettes   Smokeless tobacco: Never  Vaping Use   Vaping status: Never Used  Substance Use Topics   Alcohol use: Yes    Alcohol/week: 3.0 standard drinks of alcohol    Types: 3 Standard drinks or equivalent per week     Comment: social   Drug use: No    Mandy Sellers reports that she has been smoking cigarettes. She has a 66 pack-year smoking history. She has never used smokeless tobacco. She reports current alcohol use of about 3.0 standard drinks of alcohol per week. She reports that she does not use drugs.  Tobacco Cessation: Ready to quit: Not Answered Counseling given: Not Answered Current everyday smoker   Smoking cessation Counseling  The patient's current tobacco use: 1 PPD, 69 pack years The patient was advised to quit and impact of smoking on their health.  I assessed the patient's willingness to attempt to quit.  I provided methods and skills for cessation. Resources to help quit smoking were provided, see AVS. A smoking cessation quit date was set: Now Follow-up was arranged in our clinic.  Counseled x  3 minutes today in clinic   Past surgical hx, Family hx, Social hx all reviewed.  Current Outpatient Medications on File Prior to Visit  Medication Sig   albuterol (VENTOLIN HFA) 108 (90 Base) MCG/ACT inhaler Inhale 1-2 puffs into the lungs every 4 (four) hours as needed for wheezing or shortness of breath.   aspirin EC 81 MG tablet Take 81 mg by mouth at bedtime.   Cholecalciferol (VITAMIN D-3) 1000 UNITS CAPS Take 1,000 Units by mouth daily.   clonazePAM (KLONOPIN) 1 MG tablet Take 1 mg by mouth daily as needed (Restless legg).   hydrochlorothiazide (MICROZIDE) 12.5 MG capsule Take 1 capsule (12.5 mg total) by mouth daily.   metFORMIN (GLUCOPHAGE-XR) 500 MG 24 hr tablet Take 500 mg by mouth 2 (two) times daily.   metoprolol succinate (TOPROL-XL) 50 MG 24 hr tablet Take with or immediately following a meal.   Multiple Vitamin (MULTIVITAMIN WITH MINERALS) TABS tablet Take 1 tablet by mouth daily. 50+   Omega-3 Fatty Acids (FISH OIL) 1000 MG CAPS Take 2,000 mg by mouth daily.   rosuvastatin (CRESTOR) 10 MG tablet TAKE 1 TABLET BY MOUTH EVERY DAY   venlafaxine XR (EFFEXOR-XR) 75 MG 24 hr  capsule Take 75 mg by mouth every evening.   No current facility-administered medications on file prior to visit.     Allergies  Allergen Reactions   Amoxicillin Itching    Review Of Systems:  Constitutional:   No  weight loss, night sweats,  Fevers, chills, fatigue, or  lassitude.  HEENT:   No headaches,  Difficulty swallowing,  Tooth/dental problems, or  Sore throat,                No sneezing, itching, ear ache, nasal congestion, post nasal  drip,   CV:  No chest pain,  Orthopnea, PND, swelling in lower extremities, anasarca, dizziness, palpitations, syncope.   GI  No heartburn, indigestion, abdominal pain, nausea, vomiting, diarrhea, change in bowel habits, loss of appetite, bloody stools.   Resp: No shortness of breath with exertion or at rest.  No excess mucus, no productive cough,  No non-productive cough,  No coughing up of blood.  No change in color of mucus.  No wheezing.  No chest wall deformity  Skin: no rash or lesions.  GU: no dysuria, change in color of urine, no urgency or frequency.  No flank pain, no hematuria   MS:  No joint pain or swelling.  No decreased range of motion.  No back pain.  Psych:  No change in mood or affect. No depression or anxiety.  No memory loss.   Vital Signs BP 124/70 (BP Location: Left Arm, Patient Position: Sitting, Cuff Size: Small)   Pulse 99   Temp 97.8 F (36.6 C) (Oral)   Ht 5\' 6"  (1.676 m)   Wt 201 lb 6.4 oz (91.4 kg)   LMP 06/15/2013   SpO2 97%   BMI 32.51 kg/m    Physical Exam:  General- No distress,  A&Ox3,  ENT: No sinus tenderness, TM clear, pale nasal mucosa, no oral exudate,no post nasal drip, no LAN Cardiac: S1, S2, regular rate and rhythm, no murmur Chest: No wheeze/ rales/ dullness; no accessory muscle use, no nasal flaring, no sternal retractions Abd.: Soft Non-tender, nondistended, bowel sounds positive,Body mass index is 32.51 kg/m.  Ext: No clubbing cyanosis, edema, no obvious deformities Neuro:   normal strength, moving all extremities x 4, alert and oriented x 3, appropriate Skin: No rashes, warm and dry, no obvious lesions Psych: normal mood and behavior   Assessment/Plan Pulmonary Nodule Right lower lobe sub-solid nodule with increasing size of the solid component over time. Low avidity on PET scan, suggesting low metabolic activity. Discussed the options of immediate biopsy vs. 74-month follow-up CT scan. Patient opted for biopsy due to anxiety and desire for definitive diagnosis. -Schedule biopsy for after February 1st, 2025. -Hold aspirin 81mg  for two days prior to the procedure.  Thyroid Nodule Incidental finding on imaging. No hypermetabolic activity noted on PET scan. -Primary care doctor to order thyroid ultrasound for further evaluation. -Message left with PCP 07/17/2023 requesting they placed the order for ultrasound and any further follow-up . -I have asked the patient to call their office if she has not heard from them by Monday or Tuesday of next week, she verbalized understanding and stated she would do this  Complete Heart Block Pacemaker dependent since 2010. No new concerns raised during the conversation. -Pacemaker dependent -Continue current management under cardiologist.  Diabetes Currently managed with metformin. Discussed potential use of Ozempic for weight loss. -Discuss potential use of Ozempic with primary care doctor. -Continue metformin as you have been doing  Current everyday smoker Counseled today in the office for 3 minutes on smoking cessation strategies Patient has resources provided by her PCP to help her quit Plan Patient will work on smoking cessation starting immediately  I spent 45 minutes dedicated to the care of this patient on the date of this encounter to include pre-visit review of records, face-to-face time with the patient discussing conditions above, post visit ordering of testing, clinical documentation with the electronic health  record, making appropriate referrals as documented, and communicating necessary information to the patient's healthcare team.    Bevelyn Ngo,  NP 07/17/2023  11:42 AM

## 2023-07-17 NOTE — Progress Notes (Signed)
 History of Present Illness Mandy Sellers is a 63 y.o. female current every day heavy smoker followed by the lung cancer screening program here for follow up of abnormal imaging study concerning for bronchogenic lung cancer.. Additional medical history includes Complete heart block since the age of 9, pacemaker dependent since 2010, COPD, and Diabetes   07/17/2023 Pt.presents for follow up after PET scan to better evaluate an abnormal lung cancer screening scan done 06/25/2023 for a 4 B finding. There was notation of a  Subsolid nodule in the right lower lobe with an enlarging solid component. She also has a 2.1 mm low attenuation left thyroid nodule that needs thyroid US.  We have discussed her PET scan results. The nodule of concern has very low avidity on PET.  SUV was 1.1 and while this is below the level of metabolic activity expected for adenocarcinoma we know that low-grade SUVs can be a low-grade malignancy.  Patient would rather move forward with biopsy then managed with a watchful waiting approach.  The solid component of the partially solid nodule has been growing consistency over the last several scans. There was also notation of an enlarged thyroid nodule which was not PET avid on the scan however warrants ultrasound evaluation.  We have messaged the patient's primary care physician to see if they will manage ordering the ultrasound of the thyroid and then any follow-up is necessary, for example if the thyroid nodule meets size criteria for biopsy. Patient will become eligible for Medicare on August 08, 2023.  While she would like to move forward with bronchoscopy and biopsy she would prefer this to be after August 08, 2023 so that Medicare covers this for her.  We have placed the orders accordingly. Patient is continuing to smoke.  We had a long discussion today about the fact that she needs to quit smoking.  She verbalized understanding she states she has all necessary nicotine  replacement and Chantix at home.  These were prescribed by her primary care physician.  She understands she needs to go ahead and initiate use.  Test Results: 07/10/2023 PET  The dominant right lower lobe subsolid nodule measures 1.2 by 0.6 cm and has maximum SUV of 1.1. This is below the level of metabolic activity expected for adenocarcinoma, although low-grade adenocarcinoma can half low activity. If attempts at tissue diagnosis are not currently indicated, careful CT surveillance of this nodule would be recommended to surveil the lesion for growth.  06/11/2023 LDCT Subsolid nodule in the right lower lobe with an enlarging solid component. Lung-RADS 4B, suspicious. Additional imaging evaluation or consultation with Pulmonology or Thoracic Surgery recommended. These results will be called to the ordering clinician or representative by the Radiologist Assistant, and communication documented in the PACS or [employer].  2.1 cm low-attenuation left thyroid nodule. Recommend thyroid Ultrasound>  RADIOLOGY Lung cancer screening scan: Right lower lobe nodule, partial solid, solid component increased from 3.8 to 8 mm (06/25/2023) PET scan: Right lower lobe subsolid nodule, low avidity, below metabolic activity level expected for adenocarcinoma (07/10/2023) CT scan: Cardiomegaly, aortic atherosclerosis (06/11/2023)     Latest Ref Rng & Units 06/13/2022    3:36 PM 01/26/2020   12:45 PM 12/15/2019    4:06 PM  CBC  WBC 3.4 - 10.8 x10E3/uL 9.9  10.4  8.6   Hemoglobin 11.1 - 15.9 g/dL 14.7  82.9  56.2   Hematocrit 34.0 - 46.6 % 43.1  46.4  44.4   Platelets 150 - 450 x10E3/uL  212  207.0  215        Latest Ref Rng & Units 06/13/2022    3:36 PM 12/15/2019    4:06 PM 05/24/2019    1:53 PM  BMP  Glucose 70 - 99 mg/dL 95  604  540   BUN 8 - 27 mg/dL 15  13  15    Creatinine 0.57 - 1.00 mg/dL 9.81  1.91  4.78   BUN/Creat Ratio 12 - 28 19  23     Sodium 134 - 144 mmol/L 138  142  137    Potassium 3.5 - 5.2 mmol/L 4.4  4.3  3.6   Chloride 96 - 106 mmol/L 100  101  102   CO2 20 - 29 mmol/L 24  22  24    Calcium 8.7 - 10.3 mg/dL 29.5  9.9  9.5     BNP No results found for: "BNP"  ProBNP    Component Value Date/Time   PROBNP 43.0 03/28/2009 2037    PFT    Component Value Date/Time   FEV1PRE 1.78 03/30/2020 1357   FEV1POST 1.81 03/30/2020 1357   FVCPRE 2.37 03/30/2020 1357   FVCPOST 2.38 03/30/2020 1357   TLC 5.61 03/30/2020 1357   DLCOUNC 19.47 03/30/2020 1357   PREFEV1FVCRT 75 03/30/2020 1357   PSTFEV1FVCRT 76 03/30/2020 1357    NM PET Image Initial (PI) Skull Base To Thigh Result Date: 07/13/2023 CLINICAL DATA:  Initial treatment strategy for lung nodule. EXAM: NUCLEAR MEDICINE PET SKULL BASE TO THIGH TECHNIQUE: 10.2 mCi F-18 FDG was injected intravenously. Full-ring PET imaging was performed from the skull base to thigh after the radiotracer. CT data was obtained and used for attenuation correction and anatomic localization. Fasting blood glucose: 141 mg/dl COMPARISON:  Chest CT 62/13/0865 FINDINGS: Mediastinal blood pool activity: SUV max 3.1 Liver activity: SUV max NA NECK: No significant abnormal hypermetabolic activity in this region. Incidental CT findings: Chronic bilateral maxillary and right greater than left ethmoid sinusitis. CHEST: The dominant right lower lobe subsolid nodule measures 1.2 by 0.6 cm on image 40 series 7 and has maximum SUV of 1.1. None of the other previously described smaller lesions demonstrates any appreciable metabolic activity. Incidental CT findings: Biapical pleuroparenchymal scarring. Dual lead pacer. Coronary, aortic arch, and branch vessel atherosclerotic vascular disease. ABDOMEN/PELVIS: No significant abnormal hypermetabolic activity in this region. Incidental CT findings: Atherosclerosis is present, including aortoiliac atherosclerotic disease. Sigmoid colon diverticulosis. SKELETON: No significant abnormal hypermetabolic activity  in this region. Incidental CT findings: Mild degenerative endplate sclerosis at L2-3. IMPRESSION: 1. The dominant right lower lobe subsolid nodule measures 1.2 by 0.6 cm and has maximum SUV of 1.1. This is below the level of metabolic activity expected for adenocarcinoma, although low-grade adenocarcinoma can half low activity. If attempts at tissue diagnosis are not currently indicated, careful CT surveillance of this nodule would be recommended to surveil the lesion for growth. 2. None of the other previously described smaller lesions demonstrates any appreciable metabolic activity. 3. Chronic bilateral maxillary and right greater than left ethmoid sinusitis. 4. Sigmoid colon diverticulosis. 5. Aortic atherosclerosis. Aortic Atherosclerosis (ICD10-I70.0). Electronically Signed   By: Gaylyn Rong M.D.   On: 07/13/2023 13:59   CUP PACEART REMOTE DEVICE CHECK Result Date: 07/02/2023 Scheduled remote reviewed. Normal device function.  There were 35 atrial arrhythmias detected, known PAF, AF burden is 0.1% of the time.  All episdoes were less than 5 minutes except 2 episode were 13.5 minutes and 25.5 minutes Next remote 91 days. ML, CVRS  Past medical hx Past Medical History:  Diagnosis Date   CHB (complete heart block) (HCC) 08/09/2013   COPD (chronic obstructive pulmonary disease) (HCC)    Depression    Diabetes mellitus without complication (HCC)    HTN (hypertension) 08/09/2013   Hypertension    Obesity (BMI 30.0-34.9) 08/09/2013   Pacemaker    dual-chmber for CHB in 2010   Tobacco abuse      Social History   Tobacco Use   Smoking status: Heavy Smoker    Current packs/day: 1.50    Average packs/day: 1.5 packs/day for 44.0 years (66.0 ttl pk-yrs)    Types: Cigarettes   Smokeless tobacco: Never  Vaping Use   Vaping status: Never Used  Substance Use Topics   Alcohol use: Yes    Alcohol/week: 3.0 standard drinks of alcohol    Types: 3 Standard drinks or equivalent per week     Comment: social   Drug use: No    Ms.Heiler reports that she has been smoking cigarettes. She has a 66 pack-year smoking history. She has never used smokeless tobacco. She reports current alcohol use of about 3.0 standard drinks of alcohol per week. She reports that she does not use drugs.  Tobacco Cessation: Ready to quit: Not Answered Counseling given: Not Answered Current everyday smoker   Smoking cessation Counseling  The patient's current tobacco use: 1 PPD, 69 pack years The patient was advised to quit and impact of smoking on their health.  I assessed the patient's willingness to attempt to quit.  I provided methods and skills for cessation. Resources to help quit smoking were provided, see AVS. A smoking cessation quit date was set: Now Follow-up was arranged in our clinic.  Counseled x  3 minutes today in clinic   Past surgical hx, Family hx, Social hx all reviewed.  Current Outpatient Medications on File Prior to Visit  Medication Sig   albuterol (VENTOLIN HFA) 108 (90 Base) MCG/ACT inhaler Inhale 1-2 puffs into the lungs every 4 (four) hours as needed for wheezing or shortness of breath.   aspirin EC 81 MG tablet Take 81 mg by mouth at bedtime.   Cholecalciferol (VITAMIN D-3) 1000 UNITS CAPS Take 1,000 Units by mouth daily.   clonazePAM (KLONOPIN) 1 MG tablet Take 1 mg by mouth daily as needed (Restless legg).   hydrochlorothiazide (MICROZIDE) 12.5 MG capsule Take 1 capsule (12.5 mg total) by mouth daily.   metFORMIN (GLUCOPHAGE-XR) 500 MG 24 hr tablet Take 500 mg by mouth 2 (two) times daily.   metoprolol succinate (TOPROL-XL) 50 MG 24 hr tablet Take with or immediately following a meal.   Multiple Vitamin (MULTIVITAMIN WITH MINERALS) TABS tablet Take 1 tablet by mouth daily. 50+   Omega-3 Fatty Acids (FISH OIL) 1000 MG CAPS Take 2,000 mg by mouth daily.   rosuvastatin (CRESTOR) 10 MG tablet TAKE 1 TABLET BY MOUTH EVERY DAY   venlafaxine XR (EFFEXOR-XR) 75 MG 24 hr  capsule Take 75 mg by mouth every evening.   No current facility-administered medications on file prior to visit.     Allergies  Allergen Reactions   Amoxicillin Itching    Review Of Systems:  Constitutional:   No  weight loss, night sweats,  Fevers, chills, fatigue, or  lassitude.  HEENT:   No headaches,  Difficulty swallowing,  Tooth/dental problems, or  Sore throat,                No sneezing, itching, ear ache, nasal congestion, post nasal  drip,   CV:  No chest pain,  Orthopnea, PND, swelling in lower extremities, anasarca, dizziness, palpitations, syncope.   GI  No heartburn, indigestion, abdominal pain, nausea, vomiting, diarrhea, change in bowel habits, loss of appetite, bloody stools.   Resp: No shortness of breath with exertion or at rest.  No excess mucus, no productive cough,  No non-productive cough,  No coughing up of blood.  No change in color of mucus.  No wheezing.  No chest wall deformity  Skin: no rash or lesions.  GU: no dysuria, change in color of urine, no urgency or frequency.  No flank pain, no hematuria   MS:  No joint pain or swelling.  No decreased range of motion.  No back pain.  Psych:  No change in mood or affect. No depression or anxiety.  No memory loss.   Vital Signs BP 124/70 (BP Location: Left Arm, Patient Position: Sitting, Cuff Size: Small)   Pulse 99   Temp 97.8 F (36.6 C) (Oral)   Ht 5\' 6"  (1.676 m)   Wt 201 lb 6.4 oz (91.4 kg)   LMP 06/15/2013   SpO2 97%   BMI 32.51 kg/m    Physical Exam:  General- No distress,  A&Ox3,  ENT: No sinus tenderness, TM clear, pale nasal mucosa, no oral exudate,no post nasal drip, no LAN Cardiac: S1, S2, regular rate and rhythm, no murmur Chest: No wheeze/ rales/ dullness; no accessory muscle use, no nasal flaring, no sternal retractions Abd.: Soft Non-tender, nondistended, bowel sounds positive,Body mass index is 32.51 kg/m.  Ext: No clubbing cyanosis, edema, no obvious deformities Neuro:   normal strength, moving all extremities x 4, alert and oriented x 3, appropriate Skin: No rashes, warm and dry, no obvious lesions Psych: normal mood and behavior   Assessment/Plan Pulmonary Nodule Right lower lobe sub-solid nodule with increasing size of the solid component over time. Low avidity on PET scan, suggesting low metabolic activity. Discussed the options of immediate biopsy vs. 74-month follow-up CT scan. Patient opted for biopsy due to anxiety and desire for definitive diagnosis. -Schedule biopsy for after February 1st, 2025. -Hold aspirin 81mg  for two days prior to the procedure.  Thyroid Nodule Incidental finding on imaging. No hypermetabolic activity noted on PET scan. -Primary care doctor to order thyroid ultrasound for further evaluation. -Message left with PCP 07/17/2023 requesting they placed the order for ultrasound and any further follow-up . -I have asked the patient to call their office if she has not heard from them by Monday or Tuesday of next week, she verbalized understanding and stated she would do this  Complete Heart Block Pacemaker dependent since 2010. No new concerns raised during the conversation. -Pacemaker dependent -Continue current management under cardiologist.  Diabetes Currently managed with metformin. Discussed potential use of Ozempic for weight loss. -Discuss potential use of Ozempic with primary care doctor. -Continue metformin as you have been doing  Current everyday smoker Counseled today in the office for 3 minutes on smoking cessation strategies Patient has resources provided by her PCP to help her quit Plan Patient will work on smoking cessation starting immediately  I spent 45 minutes dedicated to the care of this patient on the date of this encounter to include pre-visit review of records, face-to-face time with the patient discussing conditions above, post visit ordering of testing, clinical documentation with the electronic health  record, making appropriate referrals as documented, and communicating necessary information to the patient's healthcare team.    Bevelyn Ngo,  NP 07/17/2023  11:42 AM

## 2023-07-20 NOTE — Telephone Encounter (Signed)
 PCP received message and order f/u US.

## 2023-07-29 DIAGNOSIS — Z124 Encounter for screening for malignant neoplasm of cervix: Secondary | ICD-10-CM | POA: Diagnosis not present

## 2023-07-29 DIAGNOSIS — Z13820 Encounter for screening for osteoporosis: Secondary | ICD-10-CM | POA: Diagnosis not present

## 2023-07-29 DIAGNOSIS — Z6832 Body mass index (BMI) 32.0-32.9, adult: Secondary | ICD-10-CM | POA: Diagnosis not present

## 2023-07-29 DIAGNOSIS — Z1231 Encounter for screening mammogram for malignant neoplasm of breast: Secondary | ICD-10-CM | POA: Diagnosis not present

## 2023-07-29 DIAGNOSIS — Z1151 Encounter for screening for human papillomavirus (HPV): Secondary | ICD-10-CM | POA: Diagnosis not present

## 2023-07-29 DIAGNOSIS — Z01419 Encounter for gynecological examination (general) (routine) without abnormal findings: Secondary | ICD-10-CM | POA: Diagnosis not present

## 2023-08-06 ENCOUNTER — Encounter (HOSPITAL_COMMUNITY): Payer: Self-pay | Admitting: Emergency Medicine

## 2023-08-06 ENCOUNTER — Other Ambulatory Visit: Payer: Self-pay

## 2023-08-06 NOTE — Progress Notes (Signed)
SDW call  Patient was given pre-op instructions over the phone. Patient verbalized understanding of instructions provided.     PCP - Dr. Hillard Danker Cardiologist - Dr. Rachelle Hora [facility]:    PPM/ICD - ICD, Medtronic Device Orders - requested Rep Notified -    Chest x-ray - 08/04/2022 EKG -  DOS, 08/10/2023 Stress Test -  ECHO - 12/09/2019 Cardiac Cath -   Sleep Study/sleep apnea/CPAP: denies  Type II diabetic Fasting Blood sugar range: doesn't check sugars How often check sugars: doesn't check sugars Metformin, instructed to hold day of surgery   Blood Thinner Instructions: denies Aspirin Instructions: states last dose evening 08/06/2023   ERAS Protcol - NPO   Anesthesia review: Yes. HTN, DM, complete heart block, Pacemaker   Patient denies shortness of breath, fever, cough and chest pain over the phone call  Your procedure is scheduled on Monday August 10, 2023  Report to [facility] Main Entrance "A" at  0530  A.M., then check in with the Admitting office.  Call this number if you have problems the morning of surgery:  [phone]   If you have any questions prior to your surgery date call [phone]: Open Monday-Friday 8am-4pm If you experience any cold or flu symptoms such as cough, fever, chills, shortness of breath, etc. between now and your scheduled surgery, please notify us at the above number    Remember:  Do not eat or drink after midnight the night before your surgery  Take these medicines the morning of surgery with A SIP OF WATER:  Crestor  As needed: Albuterol, clonazepam  As of today, STOP taking any Aspirin (unless otherwise instructed by your surgeon) Aleve, Naproxen, Ibuprofen, Motrin, Advil, Goody's, BC's, all herbal medications, fish oil, and all vitamins.

## 2023-08-07 ENCOUNTER — Encounter: Payer: Self-pay | Admitting: Cardiovascular Disease

## 2023-08-07 NOTE — Anesthesia Preprocedure Evaluation (Signed)
Anesthesia Evaluation  Patient identified by MRN, date of birth, ID band Patient awake    Reviewed: Allergy & Precautions, NPO status , Patient's Chart, lab work & pertinent test results, reviewed documented beta blocker date and time   Airway Mallampati: IV  TM Distance: >3 FB Neck ROM: Full    Dental  (+) Teeth Intact, Dental Advisory Given   Pulmonary COPD,  COPD inhaler, Current Smoker (66 pack years) Snores at night, no witnessed apneas, no sleep study  1ppd, smoked this AM Albuterol every few weeks, last use few weeks ago   Pulmonary exam normal breath sounds clear to auscultation       Cardiovascular hypertension, Pt. on home beta blockers and Pt. on medications Normal cardiovascular exam+ dysrhythmias (CHB) + pacemaker  Rhythm:Regular Rate:Normal  Echo 12/09/19: IMPRESSIONS   1. Left ventricular ejection fraction, by estimation, is 60 to 65%. The  left ventricle has normal function. The left ventricle has no regional  wall motion abnormalities. There is mild concentric left ventricular  hypertrophy. Left ventricular diastolic  function could not be evaluated.   2. Right ventricular systolic function is normal. The right ventricular  size is normal. There is normal pulmonary artery systolic pressure.   3. The mitral valve is normal in structure. No evidence of mitral valve  regurgitation. No evidence of mitral stenosis.   4. The aortic valve is normal in structure. Aortic valve regurgitation is  not visualized. No aortic stenosis is present.   5. The inferior vena cava is normal in size with greater than 50%  respiratory variability, suggesting right atrial pressure of 3 mmHg.     Neuro/Psych  Headaches PSYCHIATRIC DISORDERS  Depression       GI/Hepatic negative GI ROS, Neg liver ROS,,,  Endo/Other  diabetes, Well Controlled, Type 2, Oral Hypoglycemic Agents    Renal/GU negative Renal ROS  negative genitourinary    Musculoskeletal  (+) Arthritis ,    Abdominal   Peds  Hematology negative hematology ROS (+) Hb 15.2   Anesthesia Other Findings History includes smoking, HTN, CHB (s/p Medtronic PPM 03/30/09; dual chamber PPM generator change 06/23/22), DM2, COPD, lumbar surgery (2021), thyroid nodule (07/17/23, pulmonology referred to PCP for further w/u).  Reproductive/Obstetrics                              Anesthesia Physical Anesthesia Plan  ASA: 3  Anesthesia Plan: General   Post-op Pain Management: Minimal or no pain anticipated   Induction: Intravenous  PONV Risk Score and Plan: 2 and Midazolam, Ondansetron and Dexamethasone  Airway Management Planned: Oral ETT and Video Laryngoscope Planned  Additional Equipment: None  Intra-op Plan:   Post-operative Plan: Extubation in OR  Informed Consent: I have reviewed the patients History and Physical, chart, labs and discussed the procedure including the risks, benefits and alternatives for the proposed anesthesia with the patient or authorized representative who has indicated his/her understanding and acceptance.     Dental advisory given  Plan Discussed with: CRNA  Anesthesia Plan Comments: (Recc magnet available, no rep needed pre and post. No plans to use cautery   )        Anesthesia Quick Evaluation

## 2023-08-07 NOTE — Progress Notes (Signed)
Anesthesia Chart Review: Mandy Sellers  Case: [id number] Date/Time: 08/10/23 0730   Procedure: ROBOTIC ASSISTED NAVIGATIONAL BRONCHOSCOPY WITH BIOPSY (Right)   Anesthesia type: General   Pre-op diagnosis: SOLID LUNG NODULES RIGHT LOWER LOBE   [facility] / [facility]   Surgeons: Leslye Peer, MD       DISCUSSION: Patient is a 63 year old female scheduled for the above procedure.  History includes smoking, HTN, CHB (s/p Medtronic PPM 03/30/09; dual chamber PPM generator change 06/23/22), DM2, COPD, lumbar surgery (2021), thyroid nodule (07/17/23, pulmonology referred to PCP for further w/u).  Last cardiology follow-up with Dr. Royann Shivers was on 10/09/22. She had a PPM placed for CHB in 2010. Negative stress test at that time. Generator exchanged in 2023.  CT Coronary calcium scoring on 07/01/22 was 35.2 (74th percentile). Statin therapy continued. Smoking cessation strongly advised. No CV symptoms at last visit.  He wrote, "Although she does have a very slow ventricular escape rhythm, she tolerates lead threshold testing very poorly. She should be considered pacemaker dependent." One year follow-up planned.   EP Perioperative Device Recommendations: Device Information: Clinic EP Physician:  Dr. Royann Shivers Device Type:  Pacemaker Manufacturer and Phone #:  Medtronic: [phone] Pacemaker Dependent?:  Yes.   Date of Last Device Check:  07/02/2023     Normal Device Function?:  Yes.     Electrophysiologist's Recommendations: Have magnet available. Provide continuous ECG monitoring when magnet is used or reprogramming is to be performed.  Procedure may interfere with device function.  Magnet should be placed over device during procedure.    On metformin 500 mg BID for DM2. She does not check home CBGs.  A1c was 7.6% on 05/01/2023 [facility]).    Anesthesia team to evaluate on the day of surgery. Last EKG > 13 year old.  Labs and EKG on arrival.  Last aspirin reported  as 08/06/2023.   VS: Ht 5\' 6"  (1.676 m)   Wt 89.8 kg   LMP 06/15/2013   BMI 31.96 kg/m  BP Readings from Last 3 Encounters:  07/17/23 124/70  03/17/23 (!) 150/94  10/09/22 (!) 142/93   Pulse Readings from Last 3 Encounters:  07/17/23 99  03/17/23 65  10/09/22 (!) 104     PROVIDERS: Thana Ates, MD is PCP [facility]) Croitoru, Rachelle Hora, MD is cardiologist  LABS: For day of surgery as indicated. Last results in [facility] include: Lab Results  Component Value Date   WBC 9.9 06/13/2022   HGB 15.2 06/13/2022   HCT 43.1 06/13/2022   PLT 212 06/13/2022   GLUCOSE 95 06/13/2022   NA 138 06/13/2022   K 4.4 06/13/2022   CL 100 06/13/2022   CREATININE 0.79 06/13/2022   BUN 15 06/13/2022   CO2 24 06/13/2022     IMAGES: PET Scan 07/10/23: IMPRESSION: 1. The dominant right lower lobe subsolid nodule measures 1.2 by 0.6 cm and has maximum SUV of 1.1. This is below the level of metabolic activity expected for adenocarcinoma, although low-grade adenocarcinoma can half low activity. If attempts at tissue diagnosis are not currently indicated, careful CT surveillance of this nodule would be recommended to surveil the lesion for growth. 2. None of the other previously described smaller lesions demonstrates any appreciable metabolic activity. 3. Chronic bilateral maxillary and right greater than left ethmoid sinusitis. 4. Sigmoid colon diverticulosis. 5. Aortic atherosclerosis.  CT Chest LCS 06/11/23: IMPRESSION: 1. Subsolid nodule in the right lower lobe with an enlarging solid component. Lung-RADS 4B,  suspicious. Additional imaging evaluation or consultation with Pulmonology or Thoracic Surgery recommended. These results will be called to the ordering clinician or representative by the Radiologist Assistant, and communication documented in the PACS or [employer]. 2. 2.1 cm low-attenuation left thyroid nodule. Recommend thyroid ultrasound. (Ref: J Am Coll Radiol. 2015  Feb;12(2): 143-50). 3.  Aortic atherosclerosis (ICD10-I70.0). 4.  Emphysema (ICD10-J43.9).    EKG: Last EKG > 57 year old.  EKG 05/12/22: Atrial sensed ventricular paced rhythm   CV: CT Coronary Calcium Scoring 07/01/22: IMPRESSION: 1. Coronary calcium score of 35.2. This was 74th percentile for age-, race-, and sex-matched controls. 2. Aortic atherosclerosis.   RECOMMENDATIONS: Coronary artery calcium (CAC) score is a strong predictor of incident coronary heart disease (CHD) and provides predictive information beyond traditional risk factors. CAC scoring is reasonable to use in the decision to withhold, postpone, or initiate statin therapy in intermediate-risk or selected borderline-risk asymptomatic adults (age 55-75 years and LDL-C >=70 to <190 mg/dL) who do not have diabetes or established atherosclerotic cardiovascular disease (ASCVD).* In intermediate-risk (10-year ASCVD risk >=7.5% to <20%) adults or selected borderline-risk (10-year ASCVD risk >=5% to <7.5%) adults in whom a CAC score is measured for the purpose of making a treatment decision the following recommendations have been made...   If CAC is 1 to 99, it is reasonable to initiate statin therapy for patients >=54 years of age.   If CAC is >=100 or >=75th percentile, it is reasonable to initiate statin therapy at any age...    *2018 AHA/ACC/AACVPR/AAPA/ABC/ACPM/ADA/AGS/APhA/ASPC/NLA/PCNA Guideline on the Management of Blood Cholesterol: A Report of the American College of Cardiology/American Heart Association Task Force on Clinical Practice Guidelines. J Am Coll Cardiol. 2019;73(24):3168-3209.   Echo 12/09/19: IMPRESSIONS   1. Left ventricular ejection fraction, by estimation, is 60 to 65%. The  left ventricle has normal function. The left ventricle has no regional  wall motion abnormalities. There is mild concentric left ventricular  hypertrophy. Left ventricular diastolic  function could not be evaluated.    2. Right ventricular systolic function is normal. The right ventricular  size is normal. There is normal pulmonary artery systolic pressure.   3. The mitral valve is normal in structure. No evidence of mitral valve  regurgitation. No evidence of mitral stenosis.   4. The aortic valve is normal in structure. Aortic valve regurgitation is  not visualized. No aortic stenosis is present.   5. The inferior vena cava is normal in size with greater than 50%  respiratory variability, suggesting right atrial pressure of 3 mmHg.   Nuclear stress test 05/09/09: Normal myocardial perfusion scan demonstrating attenuation artifact in the anterior region of the myocardium.  No ischemia or infarct/scar is seen in the remaining myocardium.  No significant ischemia demonstrated.  Post-rest EF 64%.  Low risk scan.   Past Medical History:  Diagnosis Date   CHB (complete heart block) (HCC) 08/09/2013   COPD (chronic obstructive pulmonary disease) (HCC)    Depression    Diabetes mellitus without complication (HCC)    HTN (hypertension) 08/09/2013   Hypertension    Obesity (BMI 30.0-34.9) 08/09/2013   Pacemaker    dual-chmber for CHB in 2010   Tobacco abuse     Past Surgical History:  Procedure Laterality Date   COLONOSCOPY  2012   Dr.Brodie   DILATATION & CURRETTAGE/HYSTEROSCOPY WITH RESECTOCOPE N/A 11/10/2013   Procedure: DILATATION & CURETTAGE/HYSTEROSCOPY WITH MYOSURE;  Surgeon: Juluis Mire, MD;  Location: [facility];  Service: Gynecology;  Laterality: N/A;  INCONTINENCE SURGERY  03/07/2000   INSERT / REPLACE / REMOVE PACEMAKER     LUMBAR SPINE SURGERY  2021   NM MYOCAR PERF WALL MOTION  05/07/2009   bruce myoview; normal pattern of perfusion in all regions, low risk scan   PACEMAKER INSERTION  03/30/2009   for CHB; Medtronic Adapta ADDRL1 - Dr. Claudia Desanctis   PPM GENERATOR CHANGEOUT N/A 06/23/2022   Procedure: PPM GENERATOR CHANGEOUT;  Surgeon: Thurmon Fair, MD;  Location: [facility];  Service: Cardiovascular;  Laterality: N/A;   TRANSTHORACIC ECHOCARDIOGRAM  09/05/2010   EF=>55%; mild MR; trace pulm valve regurg    TUBAL LIGATION  03/07/2000    MEDICATIONS: No current facility-administered medications for this encounter.    albuterol (VENTOLIN HFA) 108 (90 Base) MCG/ACT inhaler   aspirin EC 81 MG tablet   Cholecalciferol (VITAMIN D-3) 1000 UNITS CAPS   clonazePAM (KLONOPIN) 1 MG tablet   hydrochlorothiazide (MICROZIDE) 12.5 MG capsule   metFORMIN (GLUCOPHAGE-XR) 500 MG 24 hr tablet   metoprolol succinate (TOPROL-XL) 50 MG 24 hr tablet   Multiple Vitamin (MULTIVITAMIN WITH MINERALS) TABS tablet   Omega-3 Fatty Acids (FISH OIL) 1000 MG CAPS   rosuvastatin (CRESTOR) 10 MG tablet   venlafaxine XR (EFFEXOR-XR) 75 MG 24 hr capsule     Shonna Chock, PA-C Surgical Short Stay/Anesthesiology [facility] Phone [phone] [facility] Phone [phone] 08/07/2023 12:42 PM

## 2023-08-07 NOTE — Progress Notes (Signed)
PERIOPERATIVE PRESCRIPTION FOR IMPLANTED CARDIAC DEVICE PROGRAMMING  Patient Information: Name:  Mandy Sellers  DOB:  1961/05/30  MRN:  [id number]  Planned Procedure:  robotic assisted navigational bronchoscopy with biopsy  Surgeon:  Dr. Levy Pupa  Date of Procedure:  08/10/2023  Cautery will be used.  Position during surgery:  Supine  Device Information:  Clinic EP Physician:  Dr. Royann Shivers  Device Type:  Pacemaker Manufacturer and Phone #:  Medtronic: [phone] Pacemaker Dependent?:  Yes.   Date of Last Device Check:  07/02/2023 Normal Device Function?:  Yes.    Electrophysiologist's Recommendations:  Have magnet available. Provide continuous ECG monitoring when magnet is used or reprogramming is to be performed.  Procedure may interfere with device function.  Magnet should be placed over device during procedure.  Per Device Clinic Standing Orders, Wiliam Ke, RN  11:32 AM 08/07/2023

## 2023-08-10 ENCOUNTER — Encounter (HOSPITAL_COMMUNITY): Payer: Self-pay | Admitting: Emergency Medicine

## 2023-08-10 ENCOUNTER — Ambulatory Visit (HOSPITAL_COMMUNITY): Payer: Medicare Other | Admitting: Vascular Surgery

## 2023-08-10 ENCOUNTER — Ambulatory Visit (HOSPITAL_BASED_OUTPATIENT_CLINIC_OR_DEPARTMENT_OTHER): Payer: Medicare Other | Admitting: Vascular Surgery

## 2023-08-10 ENCOUNTER — Encounter (HOSPITAL_COMMUNITY)
Admission: RE | Disposition: A | Discharge status: Home / Self Care | Payer: Self-pay | Source: Home / Self Care | Attending: Emergency Medicine

## 2023-08-10 ENCOUNTER — Ambulatory Visit (HOSPITAL_COMMUNITY): Payer: Medicare Other

## 2023-08-10 ENCOUNTER — Ambulatory Visit (HOSPITAL_COMMUNITY)
Admission: RE | Admit: 2023-08-10 | Discharge: 2023-08-10 | Disposition: A | Discharge status: Home / Self Care | Payer: Medicare Other | Attending: Emergency Medicine | Admitting: Emergency Medicine

## 2023-08-10 ENCOUNTER — Other Ambulatory Visit: Payer: Self-pay

## 2023-08-10 DIAGNOSIS — J449 Chronic obstructive pulmonary disease, unspecified: Secondary | ICD-10-CM | POA: Diagnosis not present

## 2023-08-10 DIAGNOSIS — F32A Depression, unspecified: Secondary | ICD-10-CM | POA: Diagnosis not present

## 2023-08-10 DIAGNOSIS — Z950 Presence of cardiac pacemaker: Secondary | ICD-10-CM | POA: Diagnosis not present

## 2023-08-10 DIAGNOSIS — R911 Solitary pulmonary nodule: Secondary | ICD-10-CM

## 2023-08-10 DIAGNOSIS — E041 Nontoxic single thyroid nodule: Secondary | ICD-10-CM | POA: Insufficient documentation

## 2023-08-10 DIAGNOSIS — Z7984 Long term (current) use of oral hypoglycemic drugs: Secondary | ICD-10-CM | POA: Insufficient documentation

## 2023-08-10 DIAGNOSIS — I442 Atrioventricular block, complete: Secondary | ICD-10-CM | POA: Diagnosis not present

## 2023-08-10 DIAGNOSIS — E119 Type 2 diabetes mellitus without complications: Secondary | ICD-10-CM | POA: Diagnosis not present

## 2023-08-10 DIAGNOSIS — I10 Essential (primary) hypertension: Secondary | ICD-10-CM | POA: Diagnosis not present

## 2023-08-10 DIAGNOSIS — I480 Paroxysmal atrial fibrillation: Secondary | ICD-10-CM | POA: Diagnosis not present

## 2023-08-10 DIAGNOSIS — F17210 Nicotine dependence, cigarettes, uncomplicated: Secondary | ICD-10-CM | POA: Insufficient documentation

## 2023-08-10 HISTORY — PX: BRONCHIAL NEEDLE ASPIRATION BIOPSY: SHX5106

## 2023-08-10 HISTORY — PX: BRONCHIAL BRUSHINGS: SHX5108

## 2023-08-10 HISTORY — PX: BRONCHIAL BIOPSY: SHX5109

## 2023-08-10 HISTORY — PX: BRONCHIAL WASHINGS: SHX5105

## 2023-08-10 LAB — CBC
HCT: 45.2 % (ref 36.0–46.0)
Hemoglobin: 15.2 g/dL — ABNORMAL HIGH (ref 12.0–15.0)
MCH: 29.7 pg (ref 26.0–34.0)
MCHC: 33.6 g/dL (ref 30.0–36.0)
MCV: 88.3 fL (ref 80.0–100.0)
Platelets: 195 10*3/uL (ref 150–400)
RBC: 5.12 MIL/uL — ABNORMAL HIGH (ref 3.87–5.11)
RDW: 12.9 % (ref 11.5–15.5)
WBC: 7.5 10*3/uL (ref 4.0–10.5)
nRBC: 0 % (ref 0.0–0.2)

## 2023-08-10 LAB — BASIC METABOLIC PANEL
Anion gap: 8 (ref 5–15)
BUN: 12 mg/dL (ref 8–23)
CO2: 27 mmol/L (ref 22–32)
Calcium: 9.7 mg/dL (ref 8.9–10.3)
Chloride: 105 mmol/L (ref 98–111)
Creatinine, Ser: 0.63 mg/dL (ref 0.44–1.00)
GFR, Estimated: >60 mL/min (ref >60)
Glucose, Bld: 200 mg/dL — ABNORMAL HIGH (ref 70–99)
Potassium: 4.3 mmol/L (ref 3.5–5.1)
Sodium: 140 mmol/L (ref 135–145)

## 2023-08-10 LAB — GLUCOSE, CAPILLARY
Glucose-Capillary: 193 mg/dL — ABNORMAL HIGH (ref 70–99)
Glucose-Capillary: 141 mg/dL — ABNORMAL HIGH (ref 70–99)

## 2023-08-10 SURGERY — BRONCHOSCOPY, WITH BIOPSY USING ELECTROMAGNETIC NAVIGATION
Anesthesia: General | Laterality: Right

## 2023-08-10 MED ORDER — LIDOCAINE HCL (PF) 2 % IJ SOLN
INTRAMUSCULAR | Status: DC | PRN
  Administered 2023-08-10: 60 mg via INTRADERMAL

## 2023-08-10 MED ORDER — IPRATROPIUM-ALBUTEROL 0.5-2.5 (3) MG/3ML IN SOLN
RESPIRATORY_TRACT | Status: AC
  Filled 2023-08-10: qty 3

## 2023-08-10 MED ORDER — CHLORHEXIDINE GLUCONATE 0.12 % MT SOLN
OROMUCOSAL | Status: AC
  Administered 2023-08-10: 15 mL via OROMUCOSAL
  Filled 2023-08-10: qty 15

## 2023-08-10 MED ORDER — METOPROLOL SUCCINATE ER 50 MG PO TB24: 50.0000 mg | ORAL_TABLET | Freq: Every evening | ORAL | Status: DC

## 2023-08-10 MED ORDER — ONDANSETRON HCL 4 MG/2ML IJ SOLN
INTRAMUSCULAR | Status: DC | PRN
  Administered 2023-08-10: 4 mg via INTRAVENOUS

## 2023-08-10 MED ORDER — INSULIN ASPART 100 UNIT/ML IJ SOLN
0.0000 [IU] | INTRAMUSCULAR | Status: DC | PRN
  Administered 2023-08-10: 4 [IU] via SUBCUTANEOUS

## 2023-08-10 MED ORDER — LACTATED RINGERS IV SOLN: INTRAVENOUS | Status: DC

## 2023-08-10 MED ORDER — PROPOFOL 10 MG/ML IV BOLUS
INTRAVENOUS | Status: DC | PRN
  Administered 2023-08-10: 125 ug/kg/min via INTRAVENOUS
  Administered 2023-08-10: 150 mg via INTRAVENOUS

## 2023-08-10 MED ORDER — INSULIN ASPART 100 UNIT/ML IJ SOLN
INTRAMUSCULAR | Status: AC
  Filled 2023-08-10: qty 1

## 2023-08-10 MED ORDER — FENTANYL CITRATE (PF) 100 MCG/2ML IJ SOLN: 25.0000 ug | INTRAMUSCULAR | Status: DC | PRN

## 2023-08-10 MED ORDER — SUGAMMADEX SODIUM 200 MG/2ML IV SOLN
INTRAVENOUS | Status: DC | PRN
  Administered 2023-08-10: 200 mg via INTRAVENOUS

## 2023-08-10 MED ORDER — ROCURONIUM BROMIDE 100 MG/10ML IV SOLN
INTRAVENOUS | Status: DC | PRN
  Administered 2023-08-10: 50 mg via INTRAVENOUS
  Administered 2023-08-10: 10 mg via INTRAVENOUS

## 2023-08-10 MED ORDER — IPRATROPIUM-ALBUTEROL 0.5-2.5 (3) MG/3ML IN SOLN
3.0000 mL | Freq: Once | RESPIRATORY_TRACT | Status: AC
  Administered 2023-08-10: 3 mL via RESPIRATORY_TRACT

## 2023-08-10 MED ORDER — CHLORHEXIDINE GLUCONATE 0.12 % MT SOLN: 15.0000 mL | Freq: Once | OROMUCOSAL | Status: AC

## 2023-08-10 NOTE — Discharge Instructions (Addendum)
Flexible Bronchoscopy, Care After This sheet gives you information about how to care for yourself after your test. Your doctor may also give you more specific instructions. If you have problems or questions, contact your doctor. Follow these instructions at home: Eating and drinking When your numbness is gone and your cough and gag reflexes have come back, you may: Eat only soft foods. Slowly drink liquids. When you get home after the test, go back to your normal diet. Driving Do not drive for 24 hours if you were given a medicine to help you relax (sedative). Do not drive or use heavy machinery while taking prescription pain medicine. General instructions  Take over-the-counter and prescription medicines only as told by your doctor. Return to your normal activities as told. Ask what activities are safe for you. Do not use any products that have nicotine or tobacco in them. This includes cigarettes and e-cigarettes. If you need help quitting, ask your doctor. Keep all follow-up visits as told by your doctor. This is important. It is very important if you had a tissue sample (biopsy) taken. Get help right away if: You have shortness of breath that gets worse. You get light-headed. You feel like you are going to pass out (faint). You have chest pain. You cough up: More than a little blood. More blood than before. Summary Do not eat or drink anything (not even water) for 2 hours after your test, or until your numbing medicine wears off. Do not use cigarettes. Do not use e-cigarettes. Get help right away if you have chest pain.  Please call our office for any questions or concerns.  [phone].  This information is not intended to replace advice given to you by your health care provider. Make sure you discuss any questions you have with your health care provider.

## 2023-08-10 NOTE — Op Note (Signed)
Video Bronchoscopy with Robotic Assisted Bronchoscopic Navigation   Date of Operation: 08/10/2023   Pre-op Diagnosis: Right lower lobe pulmonary nodule  Post-op Diagnosis: Same  Surgeon: Levy Pupa  Assistants: None  Anesthesia: General endotracheal anesthesia  Operation: Flexible video fiberoptic bronchoscopy with robotic assistance and biopsies.  Estimated Blood Loss: Minimal  Complications: None  Indications and History: Mandy Sellers is a 63 y.o. female with history of tobacco use.  She has a slowly enlarging mixed density right lower lobe pulmonary nodule on chest imaging.  Recommendation made to achieve a tissue diagnosis via robotic assisted navigational bronchoscopy with biopsies. The risks, benefits, complications, treatment options and expected outcomes were discussed with the patient.  The possibilities of pneumothorax, pneumonia, reaction to medication, pulmonary aspiration, perforation of a viscus, bleeding, failure to diagnose a condition and creating a complication requiring transfusion or operation were discussed with the patient who freely signed the consent.    Description of Procedure: The patient was seen in the Preoperative Area, was examined and was deemed appropriate to proceed.  The patient was taken to [facility] endoscopy room 3, identified as [name] and the procedure verified as Flexible Video Fiberoptic Bronchoscopy.  A Time Out was held and the above information confirmed.   Prior to the date of the procedure a high-resolution CT scan of the chest was performed. Utilizing ION software program a virtual tracheobronchial tree was generated to allow the creation of distinct navigation pathways to the patient's parenchymal abnormalities. After being taken to the operating room general anesthesia was initiated and the patient  was orally intubated. The video fiberoptic bronchoscope was introduced via the endotracheal tube and a general inspection was performed  which showed normal right and left lung anatomy. Aspiration of the bilateral mainstems was completed to remove any remaining secretions. Robotic catheter inserted into patient's endotracheal tube.   Target #1 lower lobe pulmonary nodule: The distinct navigation pathways prepared prior to this procedure were then utilized to navigate to patient's lesion identified on CT scan. The robotic catheter was secured into place and the vision probe was withdrawn.  Lesion location was approximated using fluoroscopy.  Local registration and targeting was performed using Cios three-dimensional imaging. Under fluoroscopic guidance transbronchial needle brushings, transbronchial needle biopsies, and transbronchial forceps biopsies were performed to be sent for cytology and pathology. A bronchioalveolar lavage was performed in the right lower lobe adjacent to the nodule and sent for microbiology.   At the end of the procedure a general airway inspection was performed and there was no evidence of active bleeding. The bronchoscope was removed.  The patient tolerated the procedure well. There was no significant blood loss and there were no obvious complications. A post-procedural chest x-ray is pending.  Samples Target #1: 1. Transbronchial needle brushings from right lower lobe pulmonary nodule 2. Transbronchial Wang needle biopsies from right lower lobe pulmonary nodule 3. Transbronchial forceps biopsies from right lower lobe pulmonary nodule 4. Bronchoalveolar lavage from right lower lobe   Plans:  The patient will be discharged from the PACU to home when recovered from anesthesia and after chest x-ray is reviewed. We will review the cytology, pathology and microbiology results with the patient when they become available. Outpatient followup will be with Saralyn Pilar, NP.    Levy Pupa, MD, PhD 08/10/2023, 8:40 AM [facility] [phone] or if no answer before 7:00PM call [phone] For  any issues after 7:00PM please call eLink [phone]

## 2023-08-10 NOTE — Anesthesia Procedure Notes (Signed)
Procedure Name: Intubation Date/Time: 08/10/2023 7:49 AM  Performed by: Venia Carbon, CRNAPre-anesthesia Checklist: Suction available, Patient being monitored, Timeout performed, Emergency Drugs available and Patient identified Patient Re-evaluated:Patient Re-evaluated prior to induction Oxygen Delivery Method: Circle system utilized Preoxygenation: Pre-oxygenation with 100% oxygen Induction Type: IV induction Ventilation: Mask ventilation without difficulty Laryngoscope Size: Mac and 3 Grade View: Grade II Tube type: Oral Tube size: 8.5 mm Number of attempts: 1 Airway Equipment and Method: Patient positioned with wedge pillow and Stylet Placement Confirmation: ETT inserted through vocal cords under direct vision, positive ETCO2, CO2 detector and breath sounds checked- equal and bilateral Secured at: 23 cm Tube secured with: Tape

## 2023-08-10 NOTE — Transfer of Care (Signed)
Immediate Anesthesia Transfer of Care Note  Patient: Mandy Sellers  Procedure(s) Performed: ROBOTIC ASSISTED NAVIGATIONAL BRONCHOSCOPY WITH BIOPSY (Right) BRONCHIAL NEEDLE ASPIRATION BIOPSIES BRONCHIAL BIOPSIES BRONCHIAL BRUSHINGS BRONCHIAL WASHINGS  Patient Location: PACU  Anesthesia Type:General  Level of Consciousness: awake, alert , oriented, and patient cooperative  Airway & Oxygen Therapy: Patient Spontanous Breathing and Patient connected to face mask  Post-op Assessment: Report given to RN, Post -op Vital signs reviewed and stable, Patient moving all extremities, Patient moving all extremities X 4, and Patient able to stick tongue midline  Post vital signs: Reviewed and stable  Last Vitals:  Vitals Value Taken Time  BP 118/105 08/10/23 0847  Temp    Pulse 90 08/10/23 0849  Resp 15 08/10/23 0849  SpO2 97 % 08/10/23 0849  Vitals shown include unfiled device data.  Last Pain:  Vitals:   08/10/23 0623  TempSrc:   PainSc: 0-No pain      Patients Stated Pain Goal: 0 (08/10/23 3244)  Complications: No notable events documented.

## 2023-08-10 NOTE — Interval H&P Note (Signed)
History and Physical Interval Note:  08/10/2023 7:27 AM  Mandy Sellers  has presented today for surgery, with the diagnosis of SOLID LUNG NODULES RIGHT LOWER LOBE.  The various methods of treatment have been discussed with the patient and family. After consideration of risks, benefits and other options for treatment, the patient has consented to  Procedure(s): ROBOTIC ASSISTED NAVIGATIONAL BRONCHOSCOPY WITH BIOPSY (Right) as a surgical intervention.  The patient's history has been reviewed, patient examined, no change in status, stable for surgery.  I have reviewed the patient's chart and labs.  Questions were answered to the patient's satisfaction.     Leslye Peer

## 2023-08-10 NOTE — Anesthesia Postprocedure Evaluation (Signed)
Anesthesia Post Note  Patient: Mandy Sellers  Procedure(s) Performed: ROBOTIC ASSISTED NAVIGATIONAL BRONCHOSCOPY WITH BIOPSY (Right) BRONCHIAL NEEDLE ASPIRATION BIOPSIES BRONCHIAL BIOPSIES BRONCHIAL BRUSHINGS BRONCHIAL WASHINGS     Patient location during evaluation: PACU Anesthesia Type: General Level of consciousness: awake and alert, oriented and patient cooperative Pain management: pain level controlled Vital Signs Assessment: post-procedure vital signs reviewed and stable Respiratory status: spontaneous breathing, nonlabored ventilation and respiratory function stable Cardiovascular status: blood pressure returned to baseline and stable Postop Assessment: no apparent nausea or vomiting Anesthetic complications: no   No notable events documented.  Last Vitals:  Vitals:   08/10/23 0900 08/10/23 0915  BP: 108/65 111/65  Pulse: 97 86  Resp: (!) 25 19  Temp:    SpO2: 91% 92%    Last Pain:  Vitals:   08/10/23 0845  TempSrc:   PainSc: 0-No pain                 [name]

## 2023-08-10 NOTE — Progress Notes (Signed)
 Remote pacemaker transmission.

## 2023-08-11 LAB — CYTOLOGY - NON PAP

## 2023-08-12 LAB — CULTURE, RESPIRATORY W GRAM STAIN: Gram Stain: NONE SEEN

## 2023-08-12 LAB — CULTURE, BAL-QUANTITATIVE W GRAM STAIN

## 2023-08-12 LAB — ACID FAST SMEAR (AFB, MYCOBACTERIA): Acid Fast Smear: NEGATIVE

## 2023-08-14 ENCOUNTER — Encounter: Payer: Self-pay | Admitting: Acute Care

## 2023-08-14 ENCOUNTER — Ambulatory Visit: Payer: Medicare Other | Admitting: Acute Care

## 2023-08-14 VITALS — BP 124/74 | HR 74 | Temp 98.4°F | Ht 66.5 in | Wt 200.8 lb

## 2023-08-14 DIAGNOSIS — R911 Solitary pulmonary nodule: Secondary | ICD-10-CM | POA: Diagnosis not present

## 2023-08-14 DIAGNOSIS — F17200 Nicotine dependence, unspecified, uncomplicated: Secondary | ICD-10-CM

## 2023-08-14 DIAGNOSIS — Z9889 Other specified postprocedural states: Secondary | ICD-10-CM

## 2023-08-14 NOTE — Progress Notes (Signed)
 History of Present Illness Mandy Sellers is a 63 y.o. female current every day heavy smoker followed by the lung cancer screening program here for follow up of abnormal imaging study concerning for bronchogenic lung cancer.. Additional medical history includes Complete heart block since the age of 14, pacemaker dependent since 2010, COPD, and Diabetes   Synopsis Abnormal Lung Cancer screening scan 06/11/2023, read as a lung RADS 4B with a subsolid nodule in the right lower lobe with an enlarging solid component.  PET scan was done which showed dominant right lower lobe with an SUV max of 1.1, adenocarcinoma remained on the list for differential. Patient underwent bronchoscopy with biopsies 08/09/2022. She is here today to review results of cytology and ensure she has done well postprocedure.   08/14/2023 Post Bronchoscopy with Biopsies Follow up Pt. Presents for follow up after bronchoscopy with biopsies. She states she has done well since the procedure. She did have a sore throat after afew days and a bit more of a cough for 1 day. She denies bleeding, she had a low grade fever of 99 x 2 days which self resolved, no change in secretions , no adverse reaction to anesthesia.   We have reviewed her cytology. The biopsies of the right lower lobe were negative for malignancy. This is reassuring. All micro, BAL, Culture, AFB and Fungus were negative.  Pt. Is still smoking. She is currently smoking a pack per day.  We again discussed smoking cessation strategies.  She states she is willing to attempt both hypnosis and acupuncture. She understands continuing to smoke increases her risk of developing lung cancer and adverse cardiac effects.  Test Results: 07/10/2023 PET  The dominant right lower lobe subsolid nodule measures 1.2 by 0.6 cm and has maximum SUV of 1.1. This is below the level of metabolic activity expected for adenocarcinoma, although low-grade adenocarcinoma can half low activity. If  attempts at tissue diagnosis are not currently indicated, careful CT surveillance of this nodule would be recommended to surveil the lesion for growth.   06/11/2023 LDCT Subsolid nodule in the right lower lobe with an enlarging solid component. Lung-RADS 4B, suspicious. Additional imaging evaluation or consultation with Pulmonology or Thoracic Surgery recommended.  FINAL MICROSCOPIC DIAGNOSIS:  A. LUNG, RLL, FINE NEEDLE ASPIRATION:  - No malignant cells identified  - Pulmonary macrophages and few benign bronchial cells   B. LUNG, RLL, BRUSHING:  - No malignant cells identified  - Pulmonary macrophages and benign bronchial cells       Latest Ref Rng & Units 08/10/2023    5:59 AM 06/13/2022    3:36 PM 01/26/2020   12:45 PM  CBC  WBC 4.0 - 10.5 K/uL 7.5  9.9  10.4   Hemoglobin 12.0 - 15.0 g/dL 84.7  84.7  83.7   Hematocrit 36.0 - 46.0 % 45.2  43.1  46.4   Platelets 150 - 400 K/uL 195  212  207.0        Latest Ref Rng & Units 08/10/2023    5:59 AM 06/13/2022    3:36 PM 12/15/2019    4:06 PM  BMP  Glucose 70 - 99 mg/dL 799  95  876   BUN 8 - 23 mg/dL 12  15  13    Creatinine 0.44 - 1.00 mg/dL 9.36  9.20  9.43   BUN/Creat Ratio 12 - 28  19  23    Sodium 135 - 145 mmol/L 140  138  142   Potassium 3.5 - 5.1 mmol/L  4.3  4.4  4.3   Chloride 98 - 111 mmol/L 105  100  101   CO2 22 - 32 mmol/L 27  24  22    Calcium  8.9 - 10.3 mg/dL 9.7  89.9  9.9     BNP No results found for: BNP  ProBNP    Component Value Date/Time   PROBNP 43.0 03/28/2009 2037    PFT    Component Value Date/Time   FEV1PRE 1.78 03/30/2020 1357   FEV1POST 1.81 03/30/2020 1357   FVCPRE 2.37 03/30/2020 1357   FVCPOST 2.38 03/30/2020 1357   TLC 5.61 03/30/2020 1357   DLCOUNC 19.47 03/30/2020 1357   PREFEV1FVCRT 75 03/30/2020 1357   PSTFEV1FVCRT 76 03/30/2020 1357    DG Chest Port 1 View Result Date: 08/10/2023 CLINICAL DATA:  Status post bronchoscopy and biopsy for right lower lobe lung nodule. EXAM:  PORTABLE CHEST 1 VIEW COMPARISON:  08/04/2022 FINDINGS: The heart size and mediastinal contours are within normal limits. Stable appearance of dual-chamber pacemaker. Bibasilar atelectasis. There is no evidence of pulmonary edema, consolidation, pneumothorax, nodule or pleural fluid. The visualized skeletal structures are unremarkable. IMPRESSION: Bibasilar atelectasis. No pneumothorax. Electronically Signed   By: Marcey Moan M.D.   On: 08/10/2023 10:10   DG C-ARM BRONCHOSCOPY Result Date: 08/10/2023 C-ARM BRONCHOSCOPY: Fluoroscopy was utilized by the requesting physician.  No radiographic interpretation.     Past medical hx Past Medical History:  Diagnosis Date   CHB (complete heart block) (HCC) 08/09/2013   COPD (chronic obstructive pulmonary disease) (HCC)    Depression    Diabetes mellitus without complication (HCC)    HTN (hypertension) 08/09/2013   Hypertension    Obesity (BMI 30.0-34.9) 08/09/2013   Pacemaker    dual-chmber for CHB in 2010   Tobacco abuse      Social History   Tobacco Use   Smoking status: Heavy Smoker    Current packs/day: 1.50    Average packs/day: 1.5 packs/day for 44.0 years (66.0 ttl pk-yrs)    Types: Cigarettes   Smokeless tobacco: Never  Vaping Use   Vaping status: Never Used  Substance Use Topics   Alcohol use: Yes    Alcohol/week: 3.0 standard drinks of alcohol    Types: 3 Standard drinks or equivalent per week    Comment: social   Drug use: No    Ms.Achorn reports that she has been smoking cigarettes. She has a 66 pack-year smoking history. She has never used smokeless tobacco. She reports current alcohol use of about 3.0 standard drinks of alcohol per week. She reports that she does not use drugs.  Tobacco Cessation: Ready to quit: Not Answered Counseling given: Not Answered Patient continues to smoke 1 pack of cigarettes per day She has been counseled on smoking cessation for 3 to 4 minutes today  Past surgical hx, Family hx,  Social hx all reviewed.  Current Outpatient Medications on File Prior to Visit  Medication Sig   albuterol  (VENTOLIN  HFA) 108 (90 Base) MCG/ACT inhaler Inhale 1-2 puffs into the lungs every 4 (four) hours as needed for wheezing or shortness of breath.   aspirin  EC 81 MG tablet Take 81 mg by mouth at bedtime.   Cholecalciferol (VITAMIN D-3) 1000 UNITS CAPS Take 1,000 Units by mouth daily.   clonazePAM (KLONOPIN) 1 MG tablet Take 1 mg by mouth daily as needed (Restless legg).   hydrochlorothiazide  (MICROZIDE ) 12.5 MG capsule Take 1 capsule (12.5 mg total) by mouth daily.   metFORMIN (GLUCOPHAGE-XR) 500 MG 24  hr tablet Take 500 mg by mouth 2 (two) times daily.   metoprolol  succinate (TOPROL -XL) 50 MG 24 hr tablet Take 1 tablet (50 mg total) by mouth at bedtime. Take with or immediately following a meal.   Multiple Vitamin (MULTIVITAMIN WITH MINERALS) TABS tablet Take 1 tablet by mouth daily. 50+   Omega-3 Fatty Acids (FISH OIL) 1000 MG CAPS Take 2,000 mg by mouth daily.   rosuvastatin  (CRESTOR ) 10 MG tablet TAKE 1 TABLET BY MOUTH EVERY DAY   venlafaxine XR (EFFEXOR-XR) 75 MG 24 hr capsule Take 75 mg by mouth every evening.   No current facility-administered medications on file prior to visit.     Allergies  Allergen Reactions   Amoxicillin  Itching    Review Of Systems:  Constitutional:   No  weight loss, night sweats,  Fevers, chills, fatigue, or  lassitude.  HEENT:   No headaches,  Difficulty swallowing,  Tooth/dental problems, or  Sore throat,                No sneezing, itching, ear ache, nasal congestion, post nasal drip,   CV:  No chest pain,  Orthopnea, PND, swelling in lower extremities, anasarca, dizziness, palpitations, syncope.   GI  No heartburn, indigestion, abdominal pain, nausea, vomiting, diarrhea, change in bowel habits, loss of appetite, bloody stools.   Resp: No shortness of breath with exertion or at rest.  No excess mucus, no productive cough,  No non-productive  cough,  No coughing up of blood.  No change in color of mucus.  No wheezing.  No chest wall deformity  Skin: no rash or lesions.  GU: no dysuria, change in color of urine, no urgency or frequency.  No flank pain, no hematuria   MS:  No joint pain or swelling.  No decreased range of motion.  No back pain.  Psych:  No change in mood or affect. No depression or anxiety.  No memory loss.   Vital Signs BP 124/74 (BP Location: Right Arm, Patient Position: Sitting, Cuff Size: Normal)   Pulse 74   Temp 98.4 F (36.9 C) (Oral)   Ht 5' 6.5 (1.689 m)   Wt 200 lb 12.8 oz (91.1 kg)   LMP 06/15/2013   SpO2 98%   BMI 31.92 kg/m    Physical Exam:  General- No distress,  A&Ox3, pleasant ENT: No sinus tenderness, TM clear, pale nasal mucosa, no oral exudate,no post nasal drip, no LAN Cardiac: S1, S2, regular rate and rhythm, no murmur Chest: No wheeze/ rales/ dullness; no accessory muscle use, no nasal flaring, no sternal retractions Abd.: Soft Non-tender, nondistended, bowel sounds positive,Body mass index is 31.92 kg/m.  Ext: No clubbing cyanosis, edema, no obvious deformities Neuro:  normal strength, moving all extremities x 4, alert and oriented x 3, Skin: No rashes, warm and dry, no obvious lesions Psych: normal mood and behavior   Assessment/Plan Current every day smoker with a right lower lobe subsolid nodule with enlarging solid component Post bronchoscopy with biopsies Plan Your biopsies were negative for malignancy. This is good news. We will do a 3 month follow up CT chest which will be due 11/2023.  You will get a call to get this scheduled closer to the time. You will follow up with me after the scan. You will also get a call to schedule this.  Call to be seen sooner for any blood in you sputum, unexplained weight loss. Please work on quitting smoking. You can receive free nicotine replacement therapy (patches,  gum, or mints) by calling 1-800-QUIT NOW. Please call so we  can get you on the path to becoming a non-smoker. I know it is hard, but you can do this!  Hypnosis for smoking cessation  [employer] [phone]  Acupuncture for smoking cessation  [employer] [phone]   Please contact office for sooner follow up if symptoms do not improve or worsen or seek emergency care    I spent 20 minutes dedicated to the care of this patient on the date of this encounter to include pre-visit review of records, face-to-face time with the patient discussing conditions above, post visit ordering of testing, clinical documentation with the electronic health record, making appropriate referrals as documented, and communicating necessary information to the patient's healthcare team.    Lauraine JULIANNA Lites, NP 08/14/2023  9:41 AM

## 2023-08-14 NOTE — Patient Instructions (Addendum)
 It is good to see you today. I am glad you did well after the procedure. Your biopsies were negative for malignancy. This is good news. We will do a 3 month follow up CT chest which will be due 11/2023.  You will get a call to get this scheduled closer to the time. You will follow up with me after the scan. You will also get a call to schedule this.  Call to be seen sooner for any blood in you sputum, unexplained weight loss. Please work on quitting smoking. You can receive free nicotine replacement therapy (patches, gum, or mints) by calling 1-800-QUIT NOW. Please call so we can get you on the path to becoming a non-smoker. I know it is hard, but you can do this!  Hypnosis for smoking cessation  [employer] [phone]  Acupuncture for smoking cessation  [employer] [phone]   Please contact office for sooner follow up if symptoms do not improve or worsen or seek emergency care

## 2023-08-15 LAB — AEROBIC/ANAEROBIC CULTURE W GRAM STAIN (SURGICAL/DEEP WOUND): Culture: NO GROWTH

## 2023-09-09 LAB — FUNGUS CULTURE RESULT

## 2023-09-09 LAB — FUNGUS CULTURE WITH STAIN

## 2023-09-09 LAB — FUNGAL ORGANISM REFLEX

## 2023-09-23 LAB — ACID FAST CULTURE WITH REFLEXED SENSITIVITIES (MYCOBACTERIA): Acid Fast Culture: NEGATIVE

## 2023-10-05 ENCOUNTER — Ambulatory Visit (INDEPENDENT_AMBULATORY_CARE_PROVIDER_SITE_OTHER): Payer: Self-pay

## 2023-10-05 DIAGNOSIS — I442 Atrioventricular block, complete: Secondary | ICD-10-CM

## 2023-10-06 LAB — CUP PACEART REMOTE DEVICE CHECK
Battery Remaining Longevity: 131 mo
Battery Voltage: 3.03 V
Brady Statistic AP VP Percent: 1.36 %
Brady Statistic AP VS Percent: 0 %
Brady Statistic AS VP Percent: 96.44 %
Brady Statistic AS VS Percent: 2.2 %
Brady Statistic RA Percent Paced: 1.45 %
Brady Statistic RV Percent Paced: 97.8 %
Date Time Interrogation Session: 20250331053422
Implantable Lead Connection Status: 753985
Implantable Lead Connection Status: 753985
Implantable Lead Implant Date: 20100924
Implantable Lead Implant Date: 20100924
Implantable Lead Location: 753859
Implantable Lead Location: 753860
Implantable Lead Model: 5076
Implantable Lead Model: 5076
Implantable Pulse Generator Implant Date: 20231218
Lead Channel Impedance Value: 399 Ohm
Lead Channel Impedance Value: 418 Ohm
Lead Channel Impedance Value: 437 Ohm
Lead Channel Impedance Value: 437 Ohm
Lead Channel Pacing Threshold Amplitude: 0.5 V
Lead Channel Pacing Threshold Amplitude: 0.625 V
Lead Channel Pacing Threshold Pulse Width: 0.4 ms
Lead Channel Pacing Threshold Pulse Width: 0.4 ms
Lead Channel Sensing Intrinsic Amplitude: 15.375 mV
Lead Channel Sensing Intrinsic Amplitude: 15.375 mV
Lead Channel Sensing Intrinsic Amplitude: 3.375 mV
Lead Channel Sensing Intrinsic Amplitude: 3.375 mV
Lead Channel Setting Pacing Amplitude: 1.5 V
Lead Channel Setting Pacing Amplitude: 2 V
Lead Channel Setting Pacing Pulse Width: 0.4 ms
Lead Channel Setting Sensing Sensitivity: 4 mV
Zone Setting Status: 755011

## 2023-10-11 ENCOUNTER — Encounter: Payer: Self-pay | Admitting: Cardiovascular Disease

## 2023-10-15 ENCOUNTER — Ambulatory Visit: Payer: Medicare Other | Attending: Cardiovascular Disease | Admitting: Cardiovascular Disease

## 2023-10-15 VITALS — BP 130/68 | HR 95 | Ht 66.5 in | Wt 199.2 lb

## 2023-10-15 DIAGNOSIS — E66811 Obesity, class 1: Secondary | ICD-10-CM

## 2023-10-15 DIAGNOSIS — E119 Type 2 diabetes mellitus without complications: Secondary | ICD-10-CM

## 2023-10-15 DIAGNOSIS — I10 Essential (primary) hypertension: Secondary | ICD-10-CM | POA: Diagnosis not present

## 2023-10-15 DIAGNOSIS — E782 Mixed hyperlipidemia: Secondary | ICD-10-CM | POA: Diagnosis not present

## 2023-10-15 DIAGNOSIS — Z720 Tobacco use: Secondary | ICD-10-CM | POA: Diagnosis not present

## 2023-10-15 DIAGNOSIS — I442 Atrioventricular block, complete: Secondary | ICD-10-CM | POA: Diagnosis not present

## 2023-10-15 DIAGNOSIS — Z950 Presence of cardiac pacemaker: Secondary | ICD-10-CM | POA: Diagnosis not present

## 2023-10-15 NOTE — Patient Instructions (Signed)
 Medication Instructions:  No changes *If you need a refill on your cardiac medications before your next appointment, please call your pharmacy*  Follow-Up: At [facility], you and your health needs are our priority.  As part of our continuing mission to provide you with exceptional heart care, our providers are all part of one team.  This team includes your primary Cardiologist (physician) and Advanced Practice Providers or APPs (Physician Assistants and Nurse Practitioners) who all work together to provide you with the care you need, when you need it.  Your next appointment:   1 year(s)  Provider:   Thurmon Fair, MD     We recommend signing up for the patient portal called "MyChart".  Sign up information is provided on this After Visit Summary.  MyChart is used to connect with patients for Virtual Visits (Telemedicine).  Patients are able to view lab/test results, encounter notes, upcoming appointments, etc.  Non-urgent messages can be sent to your provider as well.   To learn more about what you can do with MyChart, go to [email or web address].        1st Floor: - Lobby - Registration  - Pharmacy  - Lab - Cafe  2nd Floor: - PV Lab - Diagnostic Testing (echo, CT, nuclear med)  3rd Floor: - Vacant  4th Floor: - TCTS (cardiothoracic surgery) - AFib Clinic - Structural Heart Clinic - Vascular Surgery  - Vascular Ultrasound  5th Floor: - HeartCare Cardiology (general and EP) - Clinical Pharmacy for coumadin, hypertension, lipid, weight-loss medications, and med management appointments    Valet parking services will be available as well.

## 2023-10-16 NOTE — Progress Notes (Signed)
 Cardiology Office Note  10/16/2023   ID:  Mandy Sellers, DOB 14-Dec-1960, MRN [id number]  PCP:  Thana Ates, MD              [facility] Providers Cardiologist:  Thurmon Fair, MD      Evaluation Performed:  Follow-Up Visit   Chief Complaint:  Pacemaker check   History of Present Illness:     Mandy Sellers is a 63 y.o. female with complete heart block, pacemaker dependent since 2010, now approximately 1 year s/p generator change (MRI conditional system - Medtronic Azure XT DR MRI , 5076 A and V leads). Additional medical problems include hypertension, type 2 diabetes mellitus, tobacco abuse.  She generally feels well and does not have any cardiovascular complaints.  She is frustrated with weight gain and insufficient control of her blood sugar.  She continues to smoke about a pack and a half a day.  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  Device check shows normal function.  She has almost always atrial sensed, ventricular paced rhythm (atrial pacing 1%, ventricular pacing 98.5%).  She has had several episodes of atrial mode switch due to P waves in refractory.  With competitive pacing.  Adjusted her PVARP today.  She has not had any episodes of pacemaker mediated tachycardia and there is no evidence of true atrial fibrillation.  All lead parameters are excellent.  She usually does have an idioventricular escape rhythm at about 35 bpm, but should be considered pacemaker dependent.  Her ECG shows very prominent positive R wave in the septal leads and by CT her right ventricular lead is seen to be located in the middle cardiac vein.   Blood pressure is well-controlled and she has got a good lipid profile with an LDL cholesterol of 49, good HDL and normal triglycerides.  However, her hemoglobin A1c has increased to 7.6%.  She has normal renal function.       Past Medical History:  Diagnosis Date   CHB (complete heart block) (HCC) 08/09/2013    COPD (chronic obstructive pulmonary disease) (HCC)     Depression     Diabetes mellitus without complication (HCC)     HTN (hypertension) 08/09/2013   Hypertension     Obesity (BMI 30.0-34.9) 08/09/2013   Pacemaker      dual-chmber for CHB in 2010   Tobacco abuse           Past Surgical History:  Procedure Laterality Date   COLONOSCOPY   2012    Dr.Brodie   DILATATION & CURRETTAGE/HYSTEROSCOPY WITH RESECTOCOPE N/A 11/10/2013    Procedure: DILATATION & CURETTAGE/HYSTEROSCOPY WITH MYOSURE;  Surgeon: Juluis Mire, MD;  Location: [facility];  Service: Gynecology;  Laterality: N/A;   INCONTINENCE SURGERY   03/07/2000   INSERT / REPLACE / REMOVE PACEMAKER       LUMBAR SPINE SURGERY   2021   NM MYOCAR PERF WALL MOTION   05/07/2009    bruce myoview; normal pattern of perfusion in all regions, low risk scan   PACEMAKER INSERTION   03/30/2009    for CHB; Medtronic Adapta ADDRL1 - Dr. Claudia Desanctis   TRANSTHORACIC ECHOCARDIOGRAM   09/05/2010    EF=>55%; mild MR; trace pulm valve regurg    TUBAL LIGATION   03/07/2000       Allergies:   Amoxicillin     Family Hx: The patient's family history includes Alcohol abuse in her father; Deep vein thrombosis in her  brother; Diabetes in her father and mother; Hypertension in her father; Stroke in her father. There is no history of Colon cancer, Colon polyps, Esophageal cancer, Rectal cancer, or Stomach cancer.    Prior CV studies:   The following studies were reviewed today:  Comprehensive check of the pacemaker in the office today.  PVARP adjusted.   Labs/Other Tests and Data Reviewed:     EKG: Recent ECG from 08/10/2023 personally reviewed shows atrial sensed (sinus), ventricular paced rhythm  EKG Interpretation Date/Time:    Ventricular Rate:    PR Interval:    QRS Duration:    QT Interval:    QTC Calculation:   R Axis:      Text Interpretation:            Recent Labs: No results found for requested labs within last 365 days.     Recent Lipid Panel Labs (Brief)        Lab Results  Component Value Date/Time    CHOL   03/29/2009 07:30 AM      144        ATP III CLASSIFICATION:  <200     mg/dL   Desirable  161-096  mg/dL   Borderline High  >=045    mg/dL   High           TRIG 409 03/29/2009 07:30 AM    HDL 50 03/29/2009 07:30 AM    CHOLHDL 2.9 03/29/2009 07:30 AM    LDLCALC  69 03/29/2009 07:30 AM         10/30/2022 Cholesterol 133, HDL 63, LDL 49, triglycerides 122  05/01/2023  hemoglobin A1c 7.6%  08/10/2023 Hemoglobin 15.2, creatinine 0.63, potassium 4.3      Wt Readings from Last 3 Encounters:  05/12/22 82.3 kg  09/12/21 89.4 kg  08/29/21 89.4 kg      Risk Assessment/Calculations:           Objective:     BP 130/68 (BP Location: Left Arm, Patient Position: Sitting, Cuff Size: Normal)   Pulse 95   Ht 5' 6.5" (1.689 m)   Wt 199 lb 3.2 oz (90.4 kg)   LMP 06/15/2013   SpO2 96%   BMI 31.67 kg/m   General: Alert, oriented x3, no distress, healthy PM site Head: no evidence of trauma, PERRL, EOMI, no exophtalmos or lid lag, no myxedema, no xanthelasma; normal ears, nose and oropharynx Neck: normal jugular venous pulsations and no hepatojugular reflux; brisk carotid pulses without delay and no carotid bruits Chest: clear to auscultation, no signs of consolidation by percussion or palpation, normal fremitus, symmetrical and full respiratory excursions Cardiovascular: normal position and quality of the apical impulse, regular rhythm, normal first and second heart sounds, no murmurs, rubs or gallops Abdomen: no tenderness or distention, no masses by palpation, no abnormal pulsatility or arterial bruits, normal bowel sounds, no hepatosplenomegaly Extremities: no clubbing, cyanosis or edema; 2+ radial, ulnar and brachial pulses bilaterally; 2+ right femoral, posterior tibial and dorsalis pedis pulses; 2+ left femoral, posterior tibial and dorsalis pedis pulses; no subclavian or femoral  bruits Neurological: grossly nonfocal Psych: Normal mood and affect   ASSESSMENT & PLAN:     CHB: Best to be considered pacemaker dependent.  Although she does have a very slow ventricular escape rhythm, she tolerates lead threshold testing very poorly.   PPM: MRI conditional system.  Normal device function.  Note that the CT shows that her right ventricular pacemaker lead is actually located in the middle cardiac  vein.  She now has an MRI conditional system.  Continue remote downloads every 3 months. HTN: Well-controlled.  Continue metoprolol and hydrochlorothiazide.  Obesity: Associated with worsening glycemic control.  She has gained weight and this has been DM: Deteriorated glycemic control.  I think she be a good candidate for GLP-1 agonists and/or SGLT2 inhibitors which would also help with weight loss. HLP: All lipid parameters are in target range.  Continue rosuvastatin 10 mg daily. Smoking: In the past she was only able to quit smoking for about 3 months while she was using Chantix, but this caused vivid unpleasant dreams.  She does not want to try that again.  Still, smoking cessation needs to be an important goal for her.    Patient Instructions  Medication Instructions:  No changes *If you need a refill on your cardiac medications before your next appointment, please call your pharmacy*  Follow-Up: At [facility], you and your health needs are our priority.  As part of our continuing mission to provide you with exceptional heart care, our providers are all part of one team.  This team includes your primary Cardiologist (physician) and Advanced Practice Providers or APPs (Physician Assistants and Nurse Practitioners) who all work together to provide you with the care you need, when you need it.  Your next appointment:   1 year(s)  Provider:   Thurmon Fair, MD     We recommend signing up for the patient portal called "MyChart".  Sign up information is provided on  this After Visit Summary.  MyChart is used to connect with patients for Virtual Visits (Telemedicine).  Patients are able to view lab/test results, encounter notes, upcoming appointments, etc.  Non-urgent messages can be sent to your provider as well.   To learn more about what you can do with MyChart, go to [email or web address].        1st Floor: - Lobby - Registration  - Pharmacy  - Lab - Cafe  2nd Floor: - PV Lab - Diagnostic Testing (echo, CT, nuclear med)  3rd Floor: - Vacant  4th Floor: - TCTS (cardiothoracic surgery) - AFib Clinic - Structural Heart Clinic - Vascular Surgery  - Vascular Ultrasound  5th Floor: - HeartCare Cardiology (general and EP) - Clinical Pharmacy for coumadin, hypertension, lipid, weight-loss medications, and med management appointments    Valet parking services will be available as well.          Medication Adjustments/Labs and Tests Ordered: Current medicines are reviewed at length with the patient today.  Concerns regarding medicines are outlined above.    Tests Ordered:    Orders Placed This Encounter  Procedures   CBC   Basic metabolic panel      Medication Changes: No orders of the defined types were placed in this encounter.     Follow Up: Pacemaker generator change out Monday, December 18   Signed, Thurmon Fair, MD  06/13/2022 1:16 PM    [facility]

## 2023-10-25 ENCOUNTER — Other Ambulatory Visit: Payer: Self-pay | Admitting: Cardiovascular Disease

## 2023-10-30 ENCOUNTER — Encounter: Payer: Self-pay | Admitting: Cardiovascular Disease

## 2023-10-30 MED ORDER — HYDROCHLOROTHIAZIDE 12.5 MG PO CAPS: 12.5000 mg | ORAL_CAPSULE | Freq: Every day | ORAL | 3 refills | Status: DC

## 2023-11-02 ENCOUNTER — Other Ambulatory Visit: Payer: Self-pay

## 2023-11-02 MED ORDER — ROSUVASTATIN CALCIUM 10 MG PO TABS: 10.0000 mg | ORAL_TABLET | Freq: Every day | ORAL | 3 refills | Status: AC

## 2023-11-03 DIAGNOSIS — Z0000 Encounter for general adult medical examination without abnormal findings: Secondary | ICD-10-CM | POA: Diagnosis not present

## 2023-11-03 DIAGNOSIS — E1169 Type 2 diabetes mellitus with other specified complication: Secondary | ICD-10-CM | POA: Diagnosis not present

## 2023-11-03 DIAGNOSIS — Z23 Encounter for immunization: Secondary | ICD-10-CM | POA: Diagnosis not present

## 2023-11-03 DIAGNOSIS — Z79899 Other long term (current) drug therapy: Secondary | ICD-10-CM | POA: Diagnosis not present

## 2023-11-03 DIAGNOSIS — I2510 Atherosclerotic heart disease of native coronary artery without angina pectoris: Secondary | ICD-10-CM | POA: Diagnosis not present

## 2023-11-03 DIAGNOSIS — I10 Essential (primary) hypertension: Secondary | ICD-10-CM | POA: Diagnosis not present

## 2023-11-03 DIAGNOSIS — J449 Chronic obstructive pulmonary disease, unspecified: Secondary | ICD-10-CM | POA: Diagnosis not present

## 2023-11-03 DIAGNOSIS — Z950 Presence of cardiac pacemaker: Secondary | ICD-10-CM | POA: Diagnosis not present

## 2023-11-03 DIAGNOSIS — E559 Vitamin D deficiency, unspecified: Secondary | ICD-10-CM | POA: Diagnosis not present

## 2023-11-03 DIAGNOSIS — E7800 Pure hypercholesterolemia, unspecified: Secondary | ICD-10-CM | POA: Diagnosis not present

## 2023-11-05 ENCOUNTER — Other Ambulatory Visit: Payer: Self-pay | Admitting: *Deleted

## 2023-11-05 MED ORDER — HYDROCHLOROTHIAZIDE 12.5 MG PO CAPS
12.5000 mg | ORAL_CAPSULE | Freq: Every day | ORAL | 3 refills | Status: AC
Start: 2023-11-05 — End: ?

## 2023-11-05 NOTE — Progress Notes (Signed)
 Medications sent for hydrochlorothiazide  12.5 mg sent to [facility] [place], [place]. Made patient aware via patient message.

## 2023-11-11 ENCOUNTER — Ambulatory Visit
Admission: RE | Admit: 2023-11-11 | Discharge: 2023-11-11 | Disposition: A | Discharge status: Home / Self Care | Source: Ambulatory Visit | Attending: Acute Care | Admitting: Acute Care

## 2023-11-11 DIAGNOSIS — I2510 Atherosclerotic heart disease of native coronary artery without angina pectoris: Secondary | ICD-10-CM | POA: Diagnosis not present

## 2023-11-11 DIAGNOSIS — R911 Solitary pulmonary nodule: Secondary | ICD-10-CM | POA: Diagnosis not present

## 2023-11-11 DIAGNOSIS — R918 Other nonspecific abnormal finding of lung field: Secondary | ICD-10-CM | POA: Diagnosis not present

## 2023-11-11 DIAGNOSIS — E041 Nontoxic single thyroid nodule: Secondary | ICD-10-CM | POA: Diagnosis not present

## 2023-11-14 ENCOUNTER — Other Ambulatory Visit: Payer: Self-pay | Admitting: Cardiovascular Disease

## 2023-11-18 NOTE — Addendum Note (Signed)
 Addended by: Edra Govern D on: 11/18/2023 11:06 AM   Modules accepted: Orders

## 2023-11-18 NOTE — Progress Notes (Signed)
 Remote pacemaker transmission.

## 2023-12-11 ENCOUNTER — Encounter: Payer: Self-pay | Admitting: Acute Care

## 2023-12-11 ENCOUNTER — Ambulatory Visit: Admitting: Acute Care

## 2023-12-11 VITALS — BP 136/75 | HR 64 | Ht 66.0 in | Wt 193.4 lb

## 2023-12-11 DIAGNOSIS — F17210 Nicotine dependence, cigarettes, uncomplicated: Secondary | ICD-10-CM | POA: Diagnosis not present

## 2023-12-11 DIAGNOSIS — J069 Acute upper respiratory infection, unspecified: Secondary | ICD-10-CM

## 2023-12-11 DIAGNOSIS — R918 Other nonspecific abnormal finding of lung field: Secondary | ICD-10-CM | POA: Diagnosis not present

## 2023-12-11 DIAGNOSIS — F17200 Nicotine dependence, unspecified, uncomplicated: Secondary | ICD-10-CM

## 2023-12-11 MED ORDER — DOXYCYCLINE HYCLATE 100 MG PO TABS: 100.0000 mg | ORAL_TABLET | Freq: Two times a day (BID) | ORAL | 0 refills | Status: DC

## 2023-12-11 NOTE — Patient Instructions (Addendum)
 It is good to see you today. We will treat you with antibiotics  Doxycycline 1 tab in the morning and 1 in the evening x 7 days. Use sunblock when in the sun.  We will do a repeat CT Chest with contrast first week of July.( Try to schedule 01/05/2024) Follow up with me 1 week after the scan ( The week of 01/11/2024) We will review the results and determine next best steps in care. Call if you need us  sooner. Please contact office for sooner follow up if symptoms do not improve or worsen or seek emergency care

## 2023-12-11 NOTE — Progress Notes (Signed)
 History of Present Illness Mandy Sellers is a 63 y.o. female current every day heavy smoker followed by the lung cancer screening program here for follow up of abnormal imaging study concerning for bronchogenic lung cancer.. Additional medical history includes Complete heart block since the age of 84, pacemaker dependent since 2010, COPD, and Diabetes    Synopsis Abnormal Lung Cancer screening scan 06/11/2023, read as a lung RADS 4B with a subsolid nodule in the right lower lobe with an enlarging solid component.  PET scan was done which showed dominant right lower lobe with an SUV max of 1.1, adenocarcinoma remained on the list for differential. Patient underwent bronchoscopy with biopsies 08/09/2022. Cytology of the RLL was negative for malignancy. Plan was for a 3 month follow up scan to continue short interval surveillance of the nodule for stability.   12/11/2023 Patient presents for follow-up after 1-month CT chest as short interval surveillance of a right lower lobe pulmonary nodule  that was negative for malignancy 08/10/2023 on bronchoscopy with biopsy.   We have reviewed the results of the CT chest without contrast.  There is a new groundglass nodule that measures 13 mm, there are several new subgroundglass nodules that appear infectious or inflammatory.  Per patient's recollection she has not been sick.  She has been at her baseline and has had what she calls allergies recently.  I am unsure if Collie is aware of when she is flaring.  Currently she denies any fever, discolored secretions, or worsening shortness of breath and is her baseline.  We discussed the option of repeating PET scan, or treating with antibiotics as I suspect she is currently flaring and doing a short-term repeat CT chest with contrast to better evaluate these findings.  Patient is very anxious regarding these findings and is in agreement with the plan.  Patient is still smoking.  We did discuss that she needs to  stop.  Test Results: CT chest without contrast 11/11/2023 (read 11/22/2023) Biapical pleuroparenchymal scarring. Mild paraseptal emphysema.   Part solid nodule within the right lower lobe at axial image # 74/5 has decreased in size since prior examination keeping with an infectious or inflammatory process, now measuring 6 mm in mean diameter.   Pure ground-glass pulmonary nodules within the left upper lobe (image # 47/5, 13 mm and image # 48/5, 6 mm) are since immediate prior examination demonstrate mild interval growth since remote prior examination of 05/26/2018 and are compatible with low-grade adenocarcinoma.   Subtle ground-glass pulmonary infiltrate and several central pulmonary nodules have developed within the lung upper lobes bilaterally suggesting an infectious or inflammatory process. No pneumothorax or pleural effusion. No central obstructing lesion.  07/10/2023 PET  The dominant right lower lobe subsolid nodule measures 1.2 by 0.6 cm and has maximum SUV of 1.1. This is below the level of metabolic activity expected for adenocarcinoma, although low-grade adenocarcinoma can half low activity. If attempts at tissue diagnosis are not currently indicated, careful CT surveillance of this nodule would be recommended to surveil the lesion for growth.   06/11/2023 LDCT Subsolid nodule in the right lower lobe with an enlarging solid component. Lung-RADS 4B, suspicious. Additional imaging evaluation or consultation with Pulmonology or Thoracic Surgery recommended.   Cytology 08/10/2023 FINAL MICROSCOPIC DIAGNOSIS:  A. LUNG, RLL, FINE NEEDLE ASPIRATION:  - No malignant cells identified  - Pulmonary macrophages and few benign bronchial cells   B. LUNG, RLL, BRUSHING:  - No malignant cells identified  - Pulmonary macrophages and benign  bronchial cells         Latest Ref Rng & Units 08/10/2023    5:59 AM 06/13/2022    3:36 PM 01/26/2020   12:45 PM  CBC  WBC 4.0 - 10.5 K/uL 7.5   9.9  10.4   Hemoglobin 12.0 - 15.0 g/dL 09.8  11.9  14.7   Hematocrit 36.0 - 46.0 % 45.2  43.1  46.4   Platelets 150 - 400 K/uL 195  212  207.0        Latest Ref Rng & Units 08/10/2023    5:59 AM 06/13/2022    3:36 PM 12/15/2019    4:06 PM  BMP  Glucose 70 - 99 mg/dL 829  95  562   BUN 8 - 23 mg/dL 12  15  13    Creatinine 0.44 - 1.00 mg/dL 1.30  8.65  7.84   BUN/Creat Ratio 12 - 28  19  23    Sodium 135 - 145 mmol/L 140  138  142   Potassium 3.5 - 5.1 mmol/L 4.3  4.4  4.3   Chloride 98 - 111 mmol/L 105  100  101   CO2 22 - 32 mmol/L 27  24  22    Calcium  8.9 - 10.3 mg/dL 9.7  69.6  9.9     BNP No results found for: "BNP"  ProBNP    Component Value Date/Time   PROBNP 43.0 03/28/2009 2037    PFT    Component Value Date/Time   FEV1PRE 1.78 03/30/2020 1357   FEV1POST 1.81 03/30/2020 1357   FVCPRE 2.37 03/30/2020 1357   FVCPOST 2.38 03/30/2020 1357   TLC 5.61 03/30/2020 1357   DLCOUNC 19.47 03/30/2020 1357   PREFEV1FVCRT 75 03/30/2020 1357   PSTFEV1FVCRT 76 03/30/2020 1357    CT CHEST WO CONTRAST Result Date: 11/22/2023 CLINICAL DATA:  Lung nodule, > 8mm EXAM: CT CHEST WITHOUT CONTRAST TECHNIQUE: Multidetector CT imaging of the chest was performed following the standard protocol without IV contrast. RADIATION DOSE REDUCTION: This exam was performed according to the departmental dose-optimization program which includes automated exposure control, adjustment of the mA and/or kV according to patient size and/or use of iterative reconstruction technique. COMPARISON:  06/11/2023 FINDINGS: Cardiovascular: Mild coronary artery calcification. Global cardiac size is within normal limits. Left subclavian dual lead pacemaker is seen with leads within the right atrium and right ventricle with ventricular lead extending into the epicardial fat. No pericardial effusion. Central pulmonary arteries are of normal caliber. Mild atherosclerotic calcification within the thoracic aorta. No aortic  aneurysm. Mediastinum/Nodes: Stable 2.3 cm left thyroid nodule. No pathologic thoracic adenopathy. Esophagus unremarkable. Lungs/Pleura: Biapical pleuroparenchymal scarring. Mild paraseptal emphysema. Part solid nodule within the right lower lobe at axial image # 74/5 has decreased in size since prior examination keeping with an infectious or inflammatory process, now measuring 6 mm in mean diameter. Pure ground-glass pulmonary nodules within the left upper lobe (image # 47/5, 13 mm and image # 48/5, 6 mm) are since immediate prior examination demonstrate mild interval growth since remote prior examination of 05/26/2018 and are compatible with low-grade adenocarcinoma. Subtle ground-glass pulmonary infiltrate and several central pulmonary nodules have developed within the lung upper lobes bilaterally suggesting an infectious or inflammatory process. No pneumothorax or pleural effusion. No central obstructing lesion. Upper Abdomen: No acute abnormality. Musculoskeletal: No chest wall mass or suspicious bone lesions identified. IMPRESSION: 1. Part solid nodule within the right lower lobe has decreased in size since prior examination keeping with an infectious or inflammatory  process. 2. Pure ground-glass pulmonary nodules within the left upper lobe demonstrate mild interval growth since remote prior examination of 05/26/2018 and are compatible with probable low-grade adenocarcinoma. 3. Subtle ground-glass pulmonary infiltrate and several central pulmonary nodules have developed within the lung upper lobes bilaterally suggesting an infectious or inflammatory process. Short-term imaging follow-up in 3-6 months may be helpful to document resolution. 4. Mild coronary artery calcification. 5. Stable 2.3 cm left thyroid nodule. Recommend thyroid US  (ref: J Am Coll Radiol. 2015 Feb;12(2): 143-50). Aortic Atherosclerosis (ICD10-I70.0) and Emphysema (ICD10-J43.9). Electronically Signed   By: Worthy Heads M.D.   On:  11/22/2023 19:27     Past medical hx Past Medical History:  Diagnosis Date   CHB (complete heart block) (HCC) 08/09/2013   COPD (chronic obstructive pulmonary disease) (HCC)    Depression    Diabetes mellitus without complication (HCC)    HTN (hypertension) 08/09/2013   Hypertension    Obesity (BMI 30.0-34.9) 08/09/2013   Pacemaker    dual-chmber for CHB in 2010   Tobacco abuse      Social History   Tobacco Use   Smoking status: Heavy Smoker    Current packs/day: 1.50    Average packs/day: 1.5 packs/day for 44.0 years (66.0 ttl pk-yrs)    Types: Cigarettes   Smokeless tobacco: Never   Tobacco comments:    1 1/2 pack a day 12/11/2023  Vaping Use   Vaping status: Never Used  Substance Use Topics   Alcohol use: Yes    Alcohol/week: 3.0 standard drinks of alcohol    Types: 3 Standard drinks or equivalent per week    Comment: social   Drug use: No    Ms.Shetley reports that she has been smoking cigarettes. She has a 66 pack-year smoking history. She has never used smokeless tobacco. She reports current alcohol use of about 3.0 standard drinks of alcohol per week. She reports that she does not use drugs.  Tobacco Cessation: Ready to quit: Not Answered Counseling given: Not Answered Tobacco comments: 1 1/2 pack a day 12/11/2023 Current everyday smoker continues to smoke 1.5 packs/day. Patient has specifically asked me not to counsel on smoking cessation as she states that she is aware she needs to quit smoking and does not want to incur the charge.  I am in agreement and will not charge her for smoking cessation counseling as I did not provide it today.  Patient is aware she needs to quit smoking.  Past surgical hx, Family hx, Social hx all reviewed.  Current Outpatient Medications on File Prior to Visit  Medication Sig   albuterol  (VENTOLIN  HFA) 108 (90 Base) MCG/ACT inhaler Inhale 1-2 puffs into the lungs every 4 (four) hours as needed for wheezing or shortness of breath.    ALPRAZolam (XANAX) 0.25 MG tablet 1 tablet Orally twice a day as needed for 30 days   aspirin  EC 81 MG tablet Take 81 mg by mouth at bedtime.   Cholecalciferol (VITAMIN D-3) 1000 UNITS CAPS Take 1,000 Units by mouth daily.   clonazePAM (KLONOPIN) 1 MG tablet Take 1 mg by mouth daily as needed (Restless legg).   hydrochlorothiazide  (MICROZIDE ) 12.5 MG capsule Take 1 capsule (12.5 mg total) by mouth daily.   metFORMIN (GLUCOPHAGE-XR) 500 MG 24 hr tablet Take 500 mg by mouth 2 (two) times daily.   metoprolol  succinate (TOPROL -XL) 50 MG 24 hr tablet Take 1 tablet (50 mg total) by mouth at bedtime. Take with or immediately following a meal.   Multiple  Vitamin (MULTIVITAMIN WITH MINERALS) TABS tablet Take 1 tablet by mouth daily. 50+   Omega-3 Fatty Acids (FISH OIL) 1000 MG CAPS Take 2,000 mg by mouth daily.   OZEMPIC, 0.25 OR 0.5 MG/DOSE, 2 MG/3ML SOPN INJECT 0.25 MG ONCE WEEKLY FOR 4 WEEKS THEN INCREASE DOSE TO 0.5 MG ONCE WEEKLY SUBCUTANEOUS 30 DAYS   rosuvastatin  (CRESTOR ) 10 MG tablet Take 1 tablet (10 mg total) by mouth daily.   venlafaxine XR (EFFEXOR-XR) 75 MG 24 hr capsule Take 75 mg by mouth every evening.   No current facility-administered medications on file prior to visit.     Allergies  Allergen Reactions   Amoxicillin  Itching    Review Of Systems:  Constitutional:   No  weight loss, night sweats,  Fevers, chills, fatigue, or  lassitude.  HEENT:   No headaches,  Difficulty swallowing,  Tooth/dental problems, or  Sore throat,                No sneezing, itching, ear ache, nasal congestion, post nasal drip,   CV:  No chest pain,  Orthopnea, PND, swelling in lower extremities, anasarca, dizziness, palpitations, syncope.   GI  No heartburn, indigestion, abdominal pain, nausea, vomiting, diarrhea, change in bowel habits, loss of appetite, bloody stools.   Resp: No shortness of breath with exertion or at rest.  No excess mucus, no productive cough,  No non-productive cough,  No  coughing up of blood.  No change in color of mucus.  No wheezing.  No chest wall deformity  Skin: no rash or lesions.  GU: no dysuria, change in color of urine, no urgency or frequency.  No flank pain, no hematuria   MS:  No joint pain or swelling.  No decreased range of motion.  No back pain.  Psych:  No change in mood or affect. No depression or anxiety.  No memory loss.   Vital Signs BP 136/75 (BP Location: Left Arm, Patient Position: Sitting, Cuff Size: Large)   Pulse 64   Ht 5\' 6"  (1.676 m)   Wt 193 lb 6.4 oz (87.7 kg)   LMP 06/15/2013   SpO2 96%   BMI 31.22 kg/m    Physical Exam:  General- No distress,  A&Ox3, pleasant ENT: No sinus tenderness, TM clear, pale nasal mucosa, no oral exudate,no post nasal drip, no LAN Cardiac: S1, S2, regular rate and rhythm, no murmur Chest: No wheeze/ rales/ dullness; no accessory muscle use, no nasal flaring, no sternal retractions, few rhonchi that clears with cough, inspiratory wheeze Abd.: Soft Non-tender, nontender, nondistended, bowel sounds positive,Body mass index is 31.22 kg/m.  Ext: No clubbing cyanosis, edema, no obvious deformities Neuro:  normal strength, moving all extremities x 4, alert and oriented x 3, appropriate Skin: No rashes, warm and dry, no obvious skin lesions Psych: normal mood and behavior, appropriately concerned regarding CT results   Assessment/Plan Current everyday smoker Right lower lobe biopsy - February 2025 60-month follow-up surveillance CT with new groundglass opacities Plan We will treat you with antibiotics  Doxycycline  1 tab in the morning and 1 in the evening x 7 days. Use sunblock when in the sun.  We will do a repeat CT Chest with contrast first week of July.( Try to schedule 01/05/2024) Follow up with me 1 week after the scan ( The week of 01/11/2024) We will review the results and determine next best steps in care. Call if you need us  sooner. Please contact office for sooner follow up if  symptoms do  not improve or worsen or seek emergency care    I spent 35 minutes dedicated to the care of this patient on the date of this encounter to include pre-visit review of records, face-to-face time with the patient discussing conditions above, post visit ordering of testing, clinical documentation with the electronic health record, making appropriate referrals as documented, and communicating necessary information to the patient's healthcare team.      Raejean Bullock, NP 12/11/2023  9:18 AM

## 2023-12-13 ENCOUNTER — Encounter: Payer: Self-pay | Admitting: Acute Care

## 2023-12-23 ENCOUNTER — Ambulatory Visit: Admitting: Acute Care

## 2024-01-04 ENCOUNTER — Ambulatory Visit: Payer: Self-pay | Admitting: Cardiovascular Disease

## 2024-01-04 ENCOUNTER — Ambulatory Visit (INDEPENDENT_AMBULATORY_CARE_PROVIDER_SITE_OTHER): Payer: Self-pay

## 2024-01-04 DIAGNOSIS — I442 Atrioventricular block, complete: Secondary | ICD-10-CM

## 2024-01-04 LAB — CUP PACEART REMOTE DEVICE CHECK
Battery Remaining Longevity: 128 mo
Battery Voltage: 3.02 V
Brady Statistic AP VP Percent: 0.04 %
Brady Statistic AP VS Percent: 0 %
Brady Statistic AS VP Percent: 95.83 %
Brady Statistic AS VS Percent: 4.13 %
Brady Statistic RA Percent Paced: 0.05 %
Brady Statistic RV Percent Paced: 95.87 %
Date Time Interrogation Session: 20250629212243
Implantable Lead Connection Status: 753985
Implantable Lead Connection Status: 753985
Implantable Lead Implant Date: 20100924
Implantable Lead Implant Date: 20100924
Implantable Lead Location: 753859
Implantable Lead Location: 753860
Implantable Lead Model: 5076
Implantable Lead Model: 5076
Implantable Pulse Generator Implant Date: 20231218
Lead Channel Impedance Value: 380 Ohm
Lead Channel Impedance Value: 418 Ohm
Lead Channel Impedance Value: 418 Ohm
Lead Channel Impedance Value: 418 Ohm
Lead Channel Pacing Threshold Amplitude: 0.375 V
Lead Channel Pacing Threshold Amplitude: 0.5 V
Lead Channel Pacing Threshold Pulse Width: 0.4 ms
Lead Channel Pacing Threshold Pulse Width: 0.4 ms
Lead Channel Sensing Intrinsic Amplitude: 3.375 mV
Lead Channel Sensing Intrinsic Amplitude: 3.375 mV
Lead Channel Sensing Intrinsic Amplitude: 9.25 mV
Lead Channel Sensing Intrinsic Amplitude: 9.25 mV
Lead Channel Setting Pacing Amplitude: 1.5 V
Lead Channel Setting Pacing Amplitude: 2 V
Lead Channel Setting Pacing Pulse Width: 0.4 ms
Lead Channel Setting Sensing Sensitivity: 4 mV
Zone Setting Status: 755011

## 2024-01-05 ENCOUNTER — Ambulatory Visit
Admission: RE | Admit: 2024-01-05 | Discharge: 2024-01-05 | Disposition: A | Discharge status: Home / Self Care | Source: Ambulatory Visit | Attending: Acute Care | Admitting: Acute Care

## 2024-01-05 DIAGNOSIS — J069 Acute upper respiratory infection, unspecified: Secondary | ICD-10-CM

## 2024-01-05 DIAGNOSIS — R918 Other nonspecific abnormal finding of lung field: Secondary | ICD-10-CM | POA: Diagnosis not present

## 2024-01-05 DIAGNOSIS — E041 Nontoxic single thyroid nodule: Secondary | ICD-10-CM | POA: Diagnosis not present

## 2024-01-05 MED ORDER — IOPAMIDOL (ISOVUE-370) INJECTION 76%
75.0000 mL | Freq: Once | INTRAVENOUS | Status: AC | PRN
  Administered 2024-01-05: 75 mL via INTRAVENOUS

## 2024-01-13 ENCOUNTER — Ambulatory Visit: Admitting: Acute Care

## 2024-01-13 ENCOUNTER — Encounter: Payer: Self-pay | Admitting: Acute Care

## 2024-01-13 ENCOUNTER — Other Ambulatory Visit: Payer: Self-pay | Admitting: Acute Care

## 2024-01-13 VITALS — BP 131/83 | HR 81 | Temp 98.4°F | Ht 66.0 in | Wt 189.6 lb

## 2024-01-13 DIAGNOSIS — F17210 Nicotine dependence, cigarettes, uncomplicated: Secondary | ICD-10-CM

## 2024-01-13 DIAGNOSIS — R911 Solitary pulmonary nodule: Secondary | ICD-10-CM

## 2024-01-13 DIAGNOSIS — R918 Other nonspecific abnormal finding of lung field: Secondary | ICD-10-CM | POA: Diagnosis not present

## 2024-01-13 NOTE — Patient Instructions (Addendum)
 It is good to see you today. Your scan shows interval increase in the size and number of nodules. We will do a PET scan asap. You will follow up with me after the scan to review the results and determine next best steps in plan of care. .  Dr. Shelah is recommending repeat bronchoscopy or continued surveillance. You would prefer to do the PET scan and if the nodules are concerning , repeat Biopsy. Please work on quitting smoking. You can receive free nicotine replacement therapy (patches, gum, or mints) by calling 1-800-QUIT NOW. Please call so we can get you on the path to becoming a non-smoker. I know it is hard, but you can do this!  Hypnosis for smoking cessation  [employer] [phone]  Acupuncture for smoking cessation  [employer] [phone]    Continue using albuterol  as needed for shortness of breath or wheezing.  Let me know if you change your mind about a maintenance inhaler for COPD. We can provide samples for you to try.

## 2024-01-13 NOTE — Progress Notes (Signed)
 History of Present Illness Mandy Sellers is a 63 y.o. female current every day smoker followed through the screening program for abnormal lung cancer screening since 06/2023.She will be followed by Dr. Shelah.  Synopsis Mandy Sellers is a 63 y.o. female current every day heavy smoker followed by the lung cancer screening program here for follow up of abnormal imaging study concerning for bronchogenic lung cancer.. Additional medical history includes Complete heart block since the age of 17, pacemaker dependent since 2010, COPD, and Diabetes .  Pt. Was referred after an Abnormal Lung Cancer screening scan 06/11/2023, read as a lung RADS 4B with a subsolid nodule in the right lower lobe with an enlarging solid component.  PET scan was done which showed dominant right lower lobe with an SUV max of 1.1, adenocarcinoma remained on the list for differential. Patient underwent bronchoscopy with biopsies 08/09/2022. Cytology of the RLL was negative for malignancy. Plan was for a 3 month follow up scan to continue short interval surveillance of the nodule for stability. 3 month follow up was done 11/11/2023. It showed the right lower lobe nodule had decreased in size, keeping with an infectious or inflammatory process. Pure ground-glass pulmonary nodules within the left upper lobe demonstrated mild interval growth since remote prior examination of 05/26/2018 and are compatible with probable low-grade adenocarcinoma. Other subtle GG opacities had developed within the lung lobes bilaterally suggesting infectious or inflammatory process. At that time radiology recommended 3-6 month follow up imaging.Pt. was asymptomatic. No weight loss of hemoptysis, so we treated her with Doxycycline  and repeated th scan 01/05/2024 to evaluate for any improvement after treatment with antibiotics.She is here today to review the follow up Ct chest , and determine best steps in plan of care moving forward.   01/13/2024  Pt.  Presents for follow up Ct Chest. This is a repeat follow up after a scan done 11/2023 which was suspicious for infection. She was treated in June with Doxycycline , and scan was repeated again 01/05/2024, to review for improvement. We have reviewed the scan results together, and it shows increase in number and size of nodules despite antibiotic treatment.. She has had intentional weight loss on ozempic. She has lost 10 pounds in 6 weeks. She does have fatigue which is chronic for her, not new. .She does have diabetes.   I messaged Dr. Shelah, and he reviewed the scan. The targets remain small, but he suggested repeat bronchoscopy with additional biopsies and cultures. She is in agreement , but would like to do a PET scan first. There is one nodule  that is 8 mm in size. This will help determine if this could be metastatic disease from an alternate primary site.  We will work to get the PET scan scheduled asap.She will follow up as soon as we have results to determine best plan of care moving forward. She is in agreement with this plan..  Patient does endorse shortness of breath with exertion.  She does use albuterol  as needed for this shortness of breath or wheezing.  I talked to her today again about starting a maintenance inhaler to see if it would provide her relief.  She is currently still not ready to start a maintenance inhaler.  I have told her to let me know if she changes her mind and we can provide her with some samples to try before prescription is sent.  Last pulmonary function test were done in 2021 and endorsed no obstruction normal lung volumes and  diffusion capacity.  It is most likely time to repeat her pulmonary function tests, as she has continued to smoke 1 and half packs per day since 2021.  Patient has been counseled to quit smoking.  Test Results: CT Chest 01/05/2024 Interval increase in number and size of the multiple ground-glass, sub solid and solid pulmonary nodules measuring up  to 8 mm. Findings are concerning for progressive disease. Differentials include multifocal adenocarcinoma spectrum (including lepidic variant), metastasis, infection/inflammation, or miscellaneous. Correlate with clinical history.  Innumerable solid, part solid and ground-glass nodules are identified throughout both lungs, increase in number and size to prior. An index right upper lobe subsolid nodule is measures 8.3 cm with the solid component of 4 mm, previously measuring 4 mm and solid. Example of new solid nodules measuring up to 7 mm: In left upper lobe subpleural (5/21, right middle lobe 5/81, left upper lobe 5/66, 67. Some of the nodules appear stable to prior for example in right lower lobe irregular shaped nodule measuring approximately 7 mm (5/73).  CT chest without contrast 11/11/2023 (read 11/22/2023) Biapical pleuroparenchymal scarring. Mild paraseptal emphysema.   Part solid nodule within the right lower lobe at axial image # 74/5 has decreased in size since prior examination keeping with an infectious or inflammatory process, now measuring 6 mm in mean diameter.   Pure ground-glass pulmonary nodules within the left upper lobe (image # 47/5, 13 mm and image # 48/5, 6 mm) are since immediate prior examination demonstrate mild interval growth since remote prior examination of 05/26/2018 and are compatible with low-grade adenocarcinoma.   Subtle ground-glass pulmonary infiltrate and several central pulmonary nodules have developed within the lung upper lobes bilaterally suggesting an infectious or inflammatory process. No pneumothorax or pleural effusion. No central obstructing lesion.   07/10/2023 PET The dominant right lower lobe subsolid nodule measures 1.2 by 0.6 cm and has maximum SUV of 1.1. This is below the level of metabolic activity expected for adenocarcinoma, although low-grade adenocarcinoma can half low activity. If attempts at tissue diagnosis are not  currently indicated, careful CT surveillance of this nodule would be recommended to surveil the lesion for growth.    06/11/2023 LDCT Subsolid nodule in the right lower lobe with an enlarging solid component. Lung-RADS 4B, suspicious. Additional imaging evaluation or consultation with Pulmonology or Thoracic Surgery recommended.     Cytology 08/10/2023 FINAL MICROSCOPIC DIAGNOSIS:  A. LUNG, RLL, FINE NEEDLE ASPIRATION:  - No malignant cells identified  - Pulmonary macrophages and few benign bronchial cells   B. LUNG, RLL, BRUSHING:  - No malignant cells identified  - Pulmonary macrophages and benign bronchial cells        Latest Ref Rng & Units 08/10/2023    5:59 AM 06/13/2022    3:36 PM 01/26/2020   12:45 PM  CBC  WBC 4.0 - 10.5 K/uL 7.5  9.9  10.4   Hemoglobin 12.0 - 15.0 g/dL 84.7  84.7  83.7   Hematocrit 36.0 - 46.0 % 45.2  43.1  46.4   Platelets 150 - 400 K/uL 195  212  207.0        Latest Ref Rng & Units 08/10/2023    5:59 AM 06/13/2022    3:36 PM 12/15/2019    4:06 PM  BMP  Glucose 70 - 99 mg/dL 799  95  876   BUN 8 - 23 mg/dL 12  15  13    Creatinine 0.44 - 1.00 mg/dL 9.36  9.20  9.43   BUN/Creat Ratio  12 - 28  19  23    Sodium 135 - 145 mmol/L 140  138  142   Potassium 3.5 - 5.1 mmol/L 4.3  4.4  4.3   Chloride 98 - 111 mmol/L 105  100  101   CO2 22 - 32 mmol/L 27  24  22    Calcium  8.9 - 10.3 mg/dL 9.7  89.9  9.9     BNP No results found for: BNP  ProBNP    Component Value Date/Time   PROBNP 43.0 03/28/2009 2037    PFT    Component Value Date/Time   FEV1PRE 1.78 03/30/2020 1357   FEV1POST 1.81 03/30/2020 1357   FVCPRE 2.37 03/30/2020 1357   FVCPOST 2.38 03/30/2020 1357   TLC 5.61 03/30/2020 1357   DLCOUNC 19.47 03/30/2020 1357   PREFEV1FVCRT 75 03/30/2020 1357   PSTFEV1FVCRT 76 03/30/2020 1357    CT Chest W Contrast Result Date: 01/05/2024 CLINICAL DATA:  Follow-up lung nodules, history of COPD EXAM: CT CHEST WITH CONTRAST TECHNIQUE: Multidetector  CT imaging of the chest was performed during intravenous contrast administration. RADIATION DOSE REDUCTION: This exam was performed according to the departmental dose-optimization program which includes automated exposure control, adjustment of the mA and/or kV according to patient size and/or use of iterative reconstruction technique. CONTRAST:  75mL ISOVUE -370 IOPAMIDOL  (ISOVUE -370) INJECTION 76% COMPARISON:  Chest CT Nov 11, 2023., PET-CT July 10, 2023 FINDINGS: Cardiovascular: Atherosclerotic calcifications of the coronary arteries. Dual lead pacer in place with the leads in the right atrium and ventricle. Mild cardiomegaly, stable. No pericardial effusion. Mediastinum/Nodes: No suspicious lymphadenopathy. Lungs/Pleura: Innumerable solid, part solid and ground-glass nodules are identified throughout both lungs, increase in number and size to prior. An index right upper lobe subsolid nodule is measures 8.3 cm with the solid component of 4 mm, previously measuring 4 mm and solid. Example of new solid nodules measuring up to 7 mm: In left upper lobe subpleural (5/21, right middle lobe 5/81, left upper lobe 5/66, 67. Some of the nodules appear stable to prior for example in right lower lobe irregular shaped nodule measuring approximately 7 mm (5/73). Trachea and major airways are patent. There is biapical pleuroparenchymal scarring with associated paraseptal emphysematous changes right greater than left, stable to prior. No new consolidation or pleural effusion. Upper Abdomen: Geographic hepatic steatosis of right lobe. Musculoskeletal: No suspicious osseous lesion. Thyroid: Thyroid nodules, stable to prior measuring up to 2 cm. IMPRESSION: Interval increase in number and size of the multiple ground-glass, sub solid and solid pulmonary nodules measuring up to 8 mm. Findings are concerning for progressive disease. Differentials include multifocal adenocarcinoma spectrum (including lepidic variant), metastasis,  infection/inflammation, or miscellaneous. Correlate with clinical history. Recommend follow-up with dedicated PET-CT or tissue sampling if clinically warranted. Stable left thyroid lobe nodule. Electronically Signed   By: Megan  Zare M.D.   On: 01/05/2024 13:43   CUP PACEART REMOTE DEVICE CHECK Result Date: 01/04/2024 PPM scheduled remote reviewed. Normal device function.  Presenting rhythm: AS-VP Next remote 91 days. AB, CVRS    Past medical hx Past Medical History:  Diagnosis Date   CHB (complete heart block) (HCC) 08/09/2013   COPD (chronic obstructive pulmonary disease) (HCC)    Depression    Diabetes mellitus without complication (HCC)    HTN (hypertension) 08/09/2013   Hypertension    Obesity (BMI 30.0-34.9) 08/09/2013   Pacemaker    dual-chmber for CHB in 2010   Tobacco abuse      Social History   Tobacco Use  Smoking status: Heavy Smoker    Current packs/day: 1.50    Average packs/day: 1.5 packs/day for 44.0 years (66.0 ttl pk-yrs)    Types: Cigarettes   Smokeless tobacco: Current   Tobacco comments:    1 1/2 pack a day 01/13/2024    Began smoking at 63 years old     Smokes a e-cigarettes to try and weave off cigarettes as a replacement  Vaping Use   Vaping status: Never Used  Substance Use Topics   Alcohol use: Yes    Alcohol/week: 3.0 standard drinks of alcohol    Types: 3 Standard drinks or equivalent per week    Comment: social   Drug use: No    Ms.Dowding reports that she has been smoking cigarettes. She has a 66 pack-year smoking history. She uses smokeless tobacco. She reports current alcohol use of about 3.0 standard drinks of alcohol per week. She reports that she does not use drugs.  Tobacco Cessation: Ready to quit: Not Answered Counseling given: Not Answered Tobacco comments: 1 1/2 pack a day 01/13/2024 Began smoking at 63 years old  Smokes a e-cigarettes to try and weave off cigarettes as a replacement Current every day smoker , smokes  1.5 PPD  with a 66 pack year smoking history.  Past surgical hx, Family hx, Social hx all reviewed.  Current Outpatient Medications on File Prior to Visit  Medication Sig   albuterol  (VENTOLIN  HFA) 108 (90 Base) MCG/ACT inhaler Inhale 1-2 puffs into the lungs every 4 (four) hours as needed for wheezing or shortness of breath.   ALPRAZolam (XANAX) 0.25 MG tablet 1 tablet Orally twice a day as needed for 30 days   aspirin  EC 81 MG tablet Take 81 mg by mouth at bedtime.   Cholecalciferol (VITAMIN D-3) 1000 UNITS CAPS Take 1,000 Units by mouth daily.   clonazePAM (KLONOPIN) 1 MG tablet Take 1 mg by mouth daily as needed (Restless legg).   hydrochlorothiazide  (MICROZIDE ) 12.5 MG capsule Take 1 capsule (12.5 mg total) by mouth daily.   metFORMIN (GLUCOPHAGE-XR) 500 MG 24 hr tablet Take 500 mg by mouth 2 (two) times daily.   metoprolol  succinate (TOPROL -XL) 50 MG 24 hr tablet Take 1 tablet (50 mg total) by mouth at bedtime. Take with or immediately following a meal.   Multiple Vitamin (MULTIVITAMIN WITH MINERALS) TABS tablet Take 1 tablet by mouth daily. 50+   Omega-3 Fatty Acids (FISH OIL) 1000 MG CAPS Take 2,000 mg by mouth daily.   OZEMPIC, 0.25 OR 0.5 MG/DOSE, 2 MG/3ML SOPN Inject 0.5 mg into the skin once a week.   rosuvastatin  (CRESTOR ) 10 MG tablet Take 1 tablet (10 mg total) by mouth daily.   venlafaxine XR (EFFEXOR-XR) 75 MG 24 hr capsule Take 75 mg by mouth every evening.   doxycycline  (VIBRA -TABS) 100 MG tablet Take 1 tablet (100 mg total) by mouth 2 (two) times daily. (Patient not taking: Reported on 01/13/2024)   No current facility-administered medications on file prior to visit.     Allergies  Allergen Reactions   Amoxicillin  Itching    Review Of Systems:  Constitutional:   + intentional  weight loss, No night sweats,  Fevers, chills, fatigue, or  lassitude.  HEENT:   No headaches,  Difficulty swallowing,  Tooth/dental problems, or  Sore throat,                No sneezing, itching, ear  ache, nasal congestion, post nasal drip,   CV:  No chest pain,  Orthopnea, PND, swelling in lower extremities, anasarca, dizziness, palpitations, syncope.   GI  No heartburn, indigestion, abdominal pain, nausea, vomiting, diarrhea, change in bowel habits, loss of appetite, bloody stools.   Resp: + baseline  shortness of breath with exertion less at rest.  No excess mucus, no productive cough,  No non-productive cough,  No coughing up of blood.  No change in color of mucus.  No wheezing.  No chest wall deformity  Skin: no rash or lesions.  GU: no dysuria, change in color of urine, no urgency or frequency.  No flank pain, no hematuria   MS:  No joint pain or swelling.  No decreased range of motion.  No back pain.  Psych:  No change in mood or affect. No depression or anxiety.  No memory loss.   Vital Signs BP 131/83 (BP Location: Left Arm, Patient Position: Sitting, Cuff Size: Normal)   Pulse 81   Temp 98.4 F (36.9 C) (Oral)   Ht 5' 6 (1.676 m)   Wt 189 lb 9.6 oz (86 kg)   LMP 06/15/2013   SpO2 96%   BMI 30.60 kg/m    Physical Exam:  General- No distress,  A&Ox3, pleasant ENT: No sinus tenderness, TM clear, pale nasal mucosa, no oral exudate,no post nasal drip, no LAN Cardiac: S1, S2, regular rate and rhythm, no murmur Chest: + wheeze/ No rales/ dullness; no accessory muscle use, no nasal flaring, no sternal retractions, inspiratory wheezing Abd.: Soft Non-tender, ND, BS +, Body mass index is 30.6 kg/m.  Ext: No clubbing cyanosis, edema, no obvious deformities Neuro:  normal strength, MAE x 4, A&O x 3, appropriate Skin: No rashes, warm and dry, no obvious skin  lesions  Psych: normal mood and behavior   Assessment/Plan Interval increase in number and size of the multiple ground-glass, sub solid and solid pulmonary nodules measuring up to 8 mm on most recent surveillance CT chest.  Findings are concerning for progressive disease.  Current every day smoker  Intentional  weight loss on Ozempic Plan Your scan shows interval increase in the size and number of nodules. We will do a repeat PET scan asap. You will follow up with me after the scan to review the results and determine next best steps in plan of care. .  Dr. Shelah is recommending repeat bronchoscopy or continued surveillance. You would prefer to do the PET scan and if the nodules are concerning , repeat Biopsy. Please work on quitting smoking. You can receive free nicotine replacement therapy (patches, gum, or mints) by calling 1-800-QUIT NOW. Please call so we can get you on the path to becoming a non-smoker. I know it is hard, but you can do this!  Hypnosis for smoking cessation  [employer] [phone]  Acupuncture for smoking cessation  [employer] [phone]    Continue using albuterol  as needed for shortness of breath or wheezing.  Let me know if you change your mind about a maintenance inhaler for COPD. We can provide samples for you to try.  I spent 30 minutes dedicated to the care of this patient on the date of this encounter to include pre-visit review of records, face-to-face time with the patient discussing conditions above, post visit ordering of testing, clinical documentation with the electronic health record, making appropriate referrals as documented, and communicating necessary information to the patient's healthcare team.    Lauraine JULIANNA Lites, NP 01/13/2024  11:41 AM

## 2024-01-14 ENCOUNTER — Telehealth: Payer: Self-pay

## 2024-01-14 NOTE — Telephone Encounter (Signed)
 Called patient in reagrds to schedule for f/u pet scan with sarah informed there was opening on Aug 5th and to call out office at [phone] to schedule

## 2024-01-15 NOTE — Telephone Encounter (Signed)
 Scheduled virtual visit for July 25th at 8:30 will call if anything sooner

## 2024-01-15 NOTE — Telephone Encounter (Signed)
 Copied from CRM [id number]. Topic: Appointments - Scheduling Inquiry for Clinic >> Jan 15, 2024  9:08 AM Celestine FALCON wrote: Reason for CRM: Pt called back after missing a call from [facility] regarding scheduling an appt with NP Lauraine Lites. Pt was offered an app on August 5th, but wanted a sooner appt. I took a look at the schedule and found an appt for August 1st, but the pt insisted on a sooner appt. Pt stated she was speaking with [place], and was told her was working on getting an earlier appt for her right after the results would be in from her PET scan on 7/15. Pt is open to a VV with NP Lauraine Lites as well. Please have [facility] reach out to the patient at [phone] ok to leave a vm.   Called patient and offered an appointment on 7/29 said that was too far I will talk to sarah about squeezing her in for VV sooner

## 2024-01-19 ENCOUNTER — Ambulatory Visit
Admission: RE | Admit: 2024-01-19 | Discharge: 2024-01-19 | Disposition: A | Discharge status: Home / Self Care | Source: Ambulatory Visit | Attending: Acute Care | Admitting: Acute Care

## 2024-01-19 DIAGNOSIS — R918 Other nonspecific abnormal finding of lung field: Secondary | ICD-10-CM | POA: Insufficient documentation

## 2024-01-19 DIAGNOSIS — I700 Atherosclerosis of aorta: Secondary | ICD-10-CM | POA: Insufficient documentation

## 2024-01-19 DIAGNOSIS — R911 Solitary pulmonary nodule: Secondary | ICD-10-CM

## 2024-01-19 LAB — GLUCOSE, CAPILLARY: Glucose-Capillary: 94 mg/dL (ref 70–99)

## 2024-01-19 MED ORDER — FLUDEOXYGLUCOSE F - 18 (FDG) INJECTION
9.8000 | Freq: Once | INTRAVENOUS | Status: AC | PRN
  Administered 2024-01-19: 9.8 via INTRAVENOUS

## 2024-01-20 ENCOUNTER — Other Ambulatory Visit (HOSPITAL_COMMUNITY)
Admission: RE | Admit: 2024-01-20 | Discharge: 2024-01-20 | Disposition: A | Discharge status: Home / Self Care | Payer: Self-pay | Source: Ambulatory Visit | Attending: Medical Genetics | Admitting: Medical Genetics

## 2024-01-20 DIAGNOSIS — Z006 Encounter for examination for normal comparison and control in clinical research program: Secondary | ICD-10-CM | POA: Insufficient documentation

## 2024-01-21 ENCOUNTER — Other Ambulatory Visit (HOSPITAL_COMMUNITY)

## 2024-01-25 ENCOUNTER — Ambulatory Visit (HOSPITAL_COMMUNITY)

## 2024-01-29 ENCOUNTER — Encounter: Payer: Self-pay | Admitting: Acute Care

## 2024-01-29 ENCOUNTER — Ambulatory Visit: Admitting: Acute Care

## 2024-01-29 ENCOUNTER — Telehealth: Payer: Self-pay

## 2024-01-29 DIAGNOSIS — F17210 Nicotine dependence, cigarettes, uncomplicated: Secondary | ICD-10-CM

## 2024-01-29 DIAGNOSIS — R918 Other nonspecific abnormal finding of lung field: Secondary | ICD-10-CM

## 2024-01-29 DIAGNOSIS — F17200 Nicotine dependence, unspecified, uncomplicated: Secondary | ICD-10-CM

## 2024-01-29 NOTE — Progress Notes (Signed)
 History of Present Illness Mandy Sellers is a 63 y.o. female current every day smoker followed through the screening program for abnormal lung cancer screening since 06/2023.She will be followed by Dr. Shelah.   This visit was done as a video visit. Pt. Was unable to open her microphone or camera at the time of her visit.  Synopsis Mandy Sellers is a 63 y.o. female current every day heavy smoker followed by the lung cancer screening program here for follow up of abnormal imaging study concerning for bronchogenic lung cancer.. Additional medical history includes Complete heart block since the age of 64, pacemaker dependent since 2010, COPD, and Diabetes .   Pt. Was referred after an Abnormal Lung Cancer screening scan 06/11/2023, read as a lung RADS 4B with a subsolid nodule in the right lower lobe with an enlarging solid component.  PET scan was done which showed dominant right lower lobe with an SUV max of 1.1, adenocarcinoma remained on the list for differential. Patient underwent bronchoscopy with biopsies 08/09/2022. Cytology of the RLL was negative for malignancy. Plan was for a 3 month follow up scan to continue short interval surveillance of the nodule for stability. 3 month follow up was done 11/11/2023. It showed the right lower lobe nodule had decreased in size, keeping with an infectious or inflammatory process. Pure ground-glass pulmonary nodules within the left upper lobe demonstrated mild interval growth since remote prior examination of 05/26/2018 and are compatible with probable low-grade adenocarcinoma. Other subtle GG opacities had developed within the lung lobes bilaterally suggesting infectious or inflammatory process. At that time radiology recommended 3-6 month follow up imaging.Pt. was asymptomatic. No weight loss of hemoptysis, so we treated her with Doxycycline  and repeated th scan 01/05/2024 to evaluate for any improvement after treatment with antibiotics.  01/05/2024,  scan showed increase in number and size of nodules despite antibiotic treatment.I discussed this with Dr. Shelah. He suggested a repeat bronch vs short term follow up surveillance. Patient preferred a PET, which if it concerning plan was to repeat bronch. She is here by video today to review PET results.    01/29/2024 Pt. Presents for follow up after PET scan as further surveillance of increase in number and size of nodules  on the last CT chest. PET is reassuring, and per radiology leans more toward  waxing and waning appearance suggestive of an inflammatory or infectious process. Severalof the nodules are smaller than on the last CT chest. Recommendation is for  follow-up non-contrast CT recommended at 3-6 months to confirm persistence. If unchanged, and solid component remains <6 mm, annual CT is recommended until 5 years of stability has been established. If persistent these nodules should be considered highly suspicious if the solid component of the nodule is 6 mm or greater in size and enlarging. We discussed follow up imaging. We will repeat scan  in 3 months, and re-evaluate at that time. This will be due 04/2024. Pt. Is in agreement with this plan.I have told her to call for any unintentional weight loss or any blood in her sputum so we can see her sooner.     Test Results: 1. No tracer avid pulmonary nodule, lymph nodes or mass identified. 2. Multiple subcentimeter solid and non solid lung nodules are identified bilaterally. These are technically too small to reliably characterize by PET-CT. Many of these have a waxing and waning appearance suggestive of an inflammatory or infectious process. For example, the previously characterized dominant nodule in the right  lower lobe has decreased in size measuring 5 mm on today's exam. Additional small less than 1 cm solid and non solid lung nodules identified bilaterally which are all technically too small to characterize by PET-CT. The recently  characterized index sub solid nodule within the right upper lobe measures 6 mm on today's study and is too small to characterize by PET-CT. Decreased from previous exam. Recommend follow-up non-contrast CT recommended at 3-6 months to confirm persistence. If unchanged, and solid component remains <6 mm, annual CT is recommended until 5 years of stability has been established. If persistent these nodules should be considered highly suspicious if the solid component of the nodule is 6 mm or greater in size and enlarging. This recommendation follows the consensus statement: Guidelines for Management of Incidental Pulmonary Nodules Detected on CT Images:From the Fleischner Society 2017; published online before print (10.1148/radiol.[id number]). 3. No signs of tracer avid lymphadenopathy or distant metastatic disease. 4.  Aortic Atherosclerosis (ICD10-I70.0).        Latest Ref Rng & Units 08/10/2023    5:59 AM 06/13/2022    3:36 PM 01/26/2020   12:45 PM  CBC  WBC 4.0 - 10.5 K/uL 7.5  9.9  10.4   Hemoglobin 12.0 - 15.0 g/dL 84.7  84.7  83.7   Hematocrit 36.0 - 46.0 % 45.2  43.1  46.4   Platelets 150 - 400 K/uL 195  212  207.0        Latest Ref Rng & Units 08/10/2023    5:59 AM 06/13/2022    3:36 PM 12/15/2019    4:06 PM  BMP  Glucose 70 - 99 mg/dL 799  95  876   BUN 8 - 23 mg/dL 12  15  13    Creatinine 0.44 - 1.00 mg/dL 9.36  9.20  9.43   BUN/Creat Ratio 12 - 28  19  23    Sodium 135 - 145 mmol/L 140  138  142   Potassium 3.5 - 5.1 mmol/L 4.3  4.4  4.3   Chloride 98 - 111 mmol/L 105  100  101   CO2 22 - 32 mmol/L 27  24  22    Calcium  8.9 - 10.3 mg/dL 9.7  89.9  9.9     BNP No results found for: BNP  ProBNP    Component Value Date/Time   PROBNP 43.0 03/28/2009 2037    PFT    Component Value Date/Time   FEV1PRE 1.78 03/30/2020 1357   FEV1POST 1.81 03/30/2020 1357   FVCPRE 2.37 03/30/2020 1357   FVCPOST 2.38 03/30/2020 1357   TLC 5.61 03/30/2020 1357   DLCOUNC 19.47  03/30/2020 1357   PREFEV1FVCRT 75 03/30/2020 1357   PSTFEV1FVCRT 76 03/30/2020 1357    NM PET Image Restage (PS) Skull Base to Thigh (F-18 FDG) Result Date: 01/24/2024 CLINICAL DATA:  Subsequent treatment strategy for lung nodule. EXAM: NUCLEAR MEDICINE PET SKULL BASE TO THIGH TECHNIQUE: 9.8 mCi F-18 FDG was injected intravenously. Full-ring PET imaging was performed from the skull base to thigh after the radiotracer. CT data was obtained and used for attenuation correction and anatomic localization. Fasting blood glucose: 94 mg/dl COMPARISON:  PET-CT 98/96/7974 FINDINGS: Mediastinal blood pool activity: SUV max 2.7 Liver activity: SUV max NA NECK: No hypermetabolic lymph nodes in the neck. Incidental CT findings: None. CHEST: No tracer avid supraclavicular, axillary, mediastinal, or hilar lymph nodes. The previously characterized dominant nodule in the right lower lobe has decreased measuring 5 mm on today's exam, axial image 60. This is  technically too small to reliably characterize but does not show any significant tracer uptake. On the prior exam this nodule measured 1.2 x 0.6 cm. There are additional small less than 1 cm solid and non solid lung nodules identified bilaterally which are all technically too small to characterize by PET-CT. Index sub solid nodule described on the previous exam within the right upper lobe measuring 8.3 mm measures 6 mm on today's study and is too small to characterize by PET-CT, image 43. Sub solid nodule within the periphery of the right upper lobe measures 7 mm, axial image 45. Previously this was measured at 9 mm. The sub solid nodule within the left upper lobe measures 0.9 cm and has an SUV max of 1.1, axial image 46. Incidental CT findings: None. ABDOMEN/PELVIS: No abnormal hypermetabolic activity within the liver, pancreas, adrenal glands, or spleen. No hypermetabolic lymph nodes in the abdomen or pelvis. Incidental CT findings: Aortic atherosclerosis. SKELETON: No  focal hypermetabolic activity to suggest skeletal metastasis. Incidental CT findings: None. IMPRESSION: 1. No tracer avid pulmonary nodule, lymph nodes or mass identified. 2. Multiple subcentimeter solid and non solid lung nodules are identified bilaterally. These are technically too small to reliably characterize by PET-CT. Many of these have a waxing and waning appearance suggestive of an inflammatory or infectious process. For example, the previously characterized dominant nodule in the right lower lobe has decreased in size measuring 5 mm on today's exam. Additional small less than 1 cm solid and non solid lung nodules identified bilaterally which are all technically too small to characterize by PET-CT. The recently characterized index sub solid nodule within the right upper lobe measures 6 mm on today's study and is too small to characterize by PET-CT. Decreased from previous exam. Recommend follow-up non-contrast CT recommended at 3-6 months to confirm persistence. If unchanged, and solid component remains <6 mm, annual CT is recommended until 5 years of stability has been established. If persistent these nodules should be considered highly suspicious if the solid component of the nodule is 6 mm or greater in size and enlarging. This recommendation follows the consensus statement: Guidelines for Management of Incidental Pulmonary Nodules Detected on CT Images:From the Fleischner Society 2017; published online before print (10.1148/radiol.7982838340). 3. No signs of tracer avid lymphadenopathy or distant metastatic disease. 4.  Aortic Atherosclerosis (ICD10-I70.0). Electronically Signed   By: Waddell Calk M.D.   On: 01/24/2024 07:36   CT Chest W Contrast Result Date: 01/05/2024 CLINICAL DATA:  Follow-up lung nodules, history of COPD EXAM: CT CHEST WITH CONTRAST TECHNIQUE: Multidetector CT imaging of the chest was performed during intravenous contrast administration. RADIATION DOSE REDUCTION: This exam was  performed according to the departmental dose-optimization program which includes automated exposure control, adjustment of the mA and/or kV according to patient size and/or use of iterative reconstruction technique. CONTRAST:  75mL ISOVUE -370 IOPAMIDOL  (ISOVUE -370) INJECTION 76% COMPARISON:  Chest CT Nov 11, 2023., PET-CT July 10, 2023 FINDINGS: Cardiovascular: Atherosclerotic calcifications of the coronary arteries. Dual lead pacer in place with the leads in the right atrium and ventricle. Mild cardiomegaly, stable. No pericardial effusion. Mediastinum/Nodes: No suspicious lymphadenopathy. Lungs/Pleura: Innumerable solid, part solid and ground-glass nodules are identified throughout both lungs, increase in number and size to prior. An index right upper lobe subsolid nodule is measures 8.3 cm with the solid component of 4 mm, previously measuring 4 mm and solid. Example of new solid nodules measuring up to 7 mm: In left upper lobe subpleural (5/21, right middle lobe 5/81, left  upper lobe 5/66, 67. Some of the nodules appear stable to prior for example in right lower lobe irregular shaped nodule measuring approximately 7 mm (5/73). Trachea and major airways are patent. There is biapical pleuroparenchymal scarring with associated paraseptal emphysematous changes right greater than left, stable to prior. No new consolidation or pleural effusion. Upper Abdomen: Geographic hepatic steatosis of right lobe. Musculoskeletal: No suspicious osseous lesion. Thyroid: Thyroid nodules, stable to prior measuring up to 2 cm. IMPRESSION: Interval increase in number and size of the multiple ground-glass, sub solid and solid pulmonary nodules measuring up to 8 mm. Findings are concerning for progressive disease. Differentials include multifocal adenocarcinoma spectrum (including lepidic variant), metastasis, infection/inflammation, or miscellaneous. Correlate with clinical history. Recommend follow-up with dedicated PET-CT or tissue  sampling if clinically warranted. Stable left thyroid lobe nodule. Electronically Signed   By: Megan  Zare M.D.   On: 01/05/2024 13:43   CUP PACEART REMOTE DEVICE CHECK Result Date: 01/04/2024 PPM scheduled remote reviewed. Normal device function.  Presenting rhythm: AS-VP Next remote 91 days. AB, CVRS    Past medical hx Past Medical History:  Diagnosis Date   CHB (complete heart block) (HCC) 08/09/2013   COPD (chronic obstructive pulmonary disease) (HCC)    Depression    Diabetes mellitus without complication (HCC)    HTN (hypertension) 08/09/2013   Hypertension    Obesity (BMI 30.0-34.9) 08/09/2013   Pacemaker    dual-chmber for CHB in 2010   Tobacco abuse      Social History   Tobacco Use   Smoking status: Heavy Smoker    Current packs/day: 1.50    Average packs/day: 1.5 packs/day for 44.0 years (66.0 ttl pk-yrs)    Types: Cigarettes   Smokeless tobacco: Current   Tobacco comments:    1 1/2 pack a day 01/13/2024    Began smoking at 63 years old     Smokes a e-cigarettes to try and weave off cigarettes as a replacement  Vaping Use   Vaping status: Never Used  Substance Use Topics   Alcohol use: Yes    Alcohol/week: 3.0 standard drinks of alcohol    Types: 3 Standard drinks or equivalent per week    Comment: social   Drug use: No    Ms.Mcginty reports that she has been smoking cigarettes. She has a 66 pack-year smoking history. She uses smokeless tobacco. She reports current alcohol use of about 3.0 standard drinks of alcohol per week. She reports that she does not use drugs.  Tobacco Cessation: Ready to quit: Not Answered Counseling given: Not Answered Tobacco comments: 1 1/2 pack a day 01/13/2024 Began smoking at 63 years old  Smokes a e-cigarettes to try and weave off cigarettes as a replacement Current every day smoker >> counseled to quit  Past surgical hx, Family hx, Social hx all reviewed.  Current Outpatient Medications on File Prior to Visit  Medication  Sig   albuterol  (VENTOLIN  HFA) 108 (90 Base) MCG/ACT inhaler Inhale 1-2 puffs into the lungs every 4 (four) hours as needed for wheezing or shortness of breath.   ALPRAZolam (XANAX) 0.25 MG tablet 1 tablet Orally twice a day as needed for 30 days   Ascorbic Acid (VITAMIN C) 1000 MG tablet Take 1,000 mg by mouth daily.   aspirin  EC 81 MG tablet Take 81 mg by mouth at bedtime.   Cholecalciferol (VITAMIN D-3) 1000 UNITS CAPS Take 1,000 Units by mouth daily.   clonazePAM (KLONOPIN) 1 MG tablet Take 1 mg by mouth daily  as needed (Restless legg).   hydrochlorothiazide  (MICROZIDE ) 12.5 MG capsule Take 1 capsule (12.5 mg total) by mouth daily.   metFORMIN (GLUCOPHAGE-XR) 500 MG 24 hr tablet Take 500 mg by mouth 2 (two) times daily.   metoprolol  succinate (TOPROL -XL) 50 MG 24 hr tablet Take 1 tablet (50 mg total) by mouth at bedtime. Take with or immediately following a meal.   Multiple Vitamin (MULTIVITAMIN WITH MINERALS) TABS tablet Take 1 tablet by mouth daily. 50+   Omega-3 Fatty Acids (FISH OIL) 1000 MG CAPS Take 2,000 mg by mouth daily.   OZEMPIC, 0.25 OR 0.5 MG/DOSE, 2 MG/3ML SOPN Inject 0.5 mg into the skin once a week.   rosuvastatin  (CRESTOR ) 10 MG tablet Take 1 tablet (10 mg total) by mouth daily.   venlafaxine XR (EFFEXOR-XR) 75 MG 24 hr capsule Take 75 mg by mouth every evening.   No current facility-administered medications on file prior to visit.     Allergies  Allergen Reactions   Amoxicillin  Itching    Review Of Systems:  Constitutional:   No  weight loss, night sweats,  Fevers, chills, fatigue, or  lassitude.  HEENT:   No headaches,  Difficulty swallowing,  Tooth/dental problems, or  Sore throat,                No sneezing, itching, ear ache, nasal congestion, post nasal drip,   CV:  No chest pain,  Orthopnea, PND, swelling in lower extremities, anasarca, dizziness, palpitations, syncope.   GI  No heartburn, indigestion, abdominal pain, nausea, vomiting, diarrhea, change in  bowel habits, loss of appetite, bloody stools.   Resp: No shortness of breath with exertion or at rest.  No excess mucus, no productive cough,  No non-productive cough,  No coughing up of blood.  No change in color of mucus.  No wheezing.  No chest wall deformity  Skin: no rash or lesions.  GU: no dysuria, change in color of urine, no urgency or frequency.  No flank pain, no hematuria   MS:  No joint pain or swelling.  No decreased range of motion.  No back pain.  Psych:  No change in mood or affect. No depression or anxiety.  No memory loss.   Vital Signs LMP 06/15/2013  Video visit  Physical Exam:  General- No distress,  A&Ox3, pleasant ENT: No sinus tenderness, TM clear, pale nasal mucosa, no oral exudate,no post nasal drip, no LAN Cardiac: S1, S2, regular rate and rhythm, no murmur Chest: No wheeze/ rales/ dullness; no accessory muscle use, no nasal flaring, no sternal retractions Abd.: Soft Non-tender Ext: No clubbing cyanosis, edema Neuro:  normal strength Skin: No rashes, warm and dry Psych: normal mood and behavior   Assessment/Plan Multiple GG pulmonary nodules, waxing and waning in size. Recent PET shows decrease in size and appears more infectious or inflammatory etiology Current every day smoker, counseled to quit x 1 minute Plan Follow up CT Chest without contrast 04/2024 Follow up OV after to review results. If stable nodules, will spread surveillance to 6 months Call for any unintentional weight loss or blood in sputum so we can see you sooner . Please work on quitting smoking. You can receive free nicotine replacement therapy (patches, gum, or mints) by calling 1-800-QUIT NOW. Please call so we can get you on the path to becoming a non-smoker. I know it is hard, but you can do this!  Hypnosis for smoking cessation  [employer] [phone]  Acupuncture for smoking cessation  [place]  [facility] [phone]   Please contact office for  sooner follow up if symptoms do not improve or worsen or seek emergency care    I spent 20 minutes dedicated to the care of this patient on the date of this encounter to include pre-visit review of records, face-to-face time with the patient discussing conditions above, post visit ordering of testing, clinical documentation with the electronic health record, making appropriate referrals as documented, and communicating necessary information to the patient's healthcare team.    Lauraine JULIANNA Lites, NP 01/29/2024  8:36 AM

## 2024-01-29 NOTE — Telephone Encounter (Signed)
 Attempted video visit patient is not able to do mychart not able to allow access to microphone and camera

## 2024-01-29 NOTE — Patient Instructions (Signed)
 It is good to see you today. Your PET scan shows that the concerning lung nodules on your CT Chest have decreased in size. This supports more of an infectious / inflammatory cause. This is reassuring. Plan is for a Follow up CT Chest without contrast 04/2024 Follow up OV after to review results. If stable nodules, will spread surveillance to 6 months Call for any unintentional weight loss or blood in sputum so we can see you sooner . Please work on quitting smoking. You can receive free nicotine replacement therapy (patches, gum, or mints) by calling 1-800-QUIT NOW. Please call so we can get you on the path to becoming a non-smoker. I know it is hard, but you can do this!  Hypnosis for smoking cessation  [employer] [phone]  Acupuncture for smoking cessation  [employer] [phone]   Please contact office for sooner follow up if symptoms do not improve or worsen or seek emergency care

## 2024-02-01 LAB — GENECONNECT MOLECULAR SCREEN: Genetic Analysis Overall Interpretation: NEGATIVE

## 2024-02-02 NOTE — Progress Notes (Signed)
 I agree with the plans as outlined above.   Lamar Chris, MD, PhD 02/02/2024, 3:25 PM [facility] [phone] or if no answer before 7:00PM call [phone] For any issues after 7:00PM please call eLink [phone]

## 2024-02-03 DIAGNOSIS — J449 Chronic obstructive pulmonary disease, unspecified: Secondary | ICD-10-CM | POA: Diagnosis not present

## 2024-02-03 DIAGNOSIS — F17200 Nicotine dependence, unspecified, uncomplicated: Secondary | ICD-10-CM | POA: Diagnosis not present

## 2024-02-03 DIAGNOSIS — E1169 Type 2 diabetes mellitus with other specified complication: Secondary | ICD-10-CM | POA: Diagnosis not present

## 2024-02-03 DIAGNOSIS — I10 Essential (primary) hypertension: Secondary | ICD-10-CM | POA: Diagnosis not present

## 2024-02-04 NOTE — Progress Notes (Signed)
 Remote pacemaker transmission.

## 2024-02-22 ENCOUNTER — Telehealth: Payer: Self-pay | Admitting: Internal Medicine

## 2024-02-22 ENCOUNTER — Encounter (HOSPITAL_BASED_OUTPATIENT_CLINIC_OR_DEPARTMENT_OTHER): Payer: Self-pay | Admitting: Emergency Medicine

## 2024-02-22 ENCOUNTER — Emergency Department (HOSPITAL_BASED_OUTPATIENT_CLINIC_OR_DEPARTMENT_OTHER)

## 2024-02-22 ENCOUNTER — Emergency Department (HOSPITAL_BASED_OUTPATIENT_CLINIC_OR_DEPARTMENT_OTHER)
Admission: EM | Admit: 2024-02-22 | Discharge: 2024-02-22 | Disposition: A | Discharge status: Home / Self Care | Source: Ambulatory Visit | Attending: Emergency Medicine | Admitting: Emergency Medicine

## 2024-02-22 DIAGNOSIS — K5732 Diverticulitis of large intestine without perforation or abscess without bleeding: Secondary | ICD-10-CM | POA: Diagnosis not present

## 2024-02-22 DIAGNOSIS — K5792 Diverticulitis of intestine, part unspecified, without perforation or abscess without bleeding: Secondary | ICD-10-CM

## 2024-02-22 DIAGNOSIS — R000 Tachycardia, unspecified: Secondary | ICD-10-CM | POA: Insufficient documentation

## 2024-02-22 DIAGNOSIS — Z7982 Long term (current) use of aspirin: Secondary | ICD-10-CM | POA: Insufficient documentation

## 2024-02-22 DIAGNOSIS — D72829 Elevated white blood cell count, unspecified: Secondary | ICD-10-CM | POA: Insufficient documentation

## 2024-02-22 DIAGNOSIS — K4090 Unilateral inguinal hernia, without obstruction or gangrene, not specified as recurrent: Secondary | ICD-10-CM | POA: Diagnosis not present

## 2024-02-22 DIAGNOSIS — R1032 Left lower quadrant pain: Secondary | ICD-10-CM | POA: Diagnosis not present

## 2024-02-22 DIAGNOSIS — K429 Umbilical hernia without obstruction or gangrene: Secondary | ICD-10-CM | POA: Diagnosis not present

## 2024-02-22 LAB — URINALYSIS, ROUTINE W REFLEX MICROSCOPIC
Bilirubin Urine: NEGATIVE
Glucose, UA: NEGATIVE mg/dL
Hgb urine dipstick: NEGATIVE
Ketones, ur: NEGATIVE mg/dL
Nitrite: NEGATIVE
Protein, ur: NEGATIVE mg/dL
Specific Gravity, Urine: 1.018 (ref 1.005–1.030)
pH: 5.5 (ref 5.0–8.0)

## 2024-02-22 LAB — CBC
HCT: 41.5 % (ref 36.0–46.0)
Hemoglobin: 14.5 g/dL (ref 12.0–15.0)
MCH: 30 pg (ref 26.0–34.0)
MCHC: 34.9 g/dL (ref 30.0–36.0)
MCV: 85.7 fL (ref 80.0–100.0)
Platelets: 232 K/uL (ref 150–400)
RBC: 4.84 MIL/uL (ref 3.87–5.11)
RDW: 12.6 % (ref 11.5–15.5)
WBC: 14.2 K/uL — ABNORMAL HIGH (ref 4.0–10.5)
nRBC: 0 % (ref 0.0–0.2)

## 2024-02-22 LAB — COMPREHENSIVE METABOLIC PANEL WITH GFR
ALT: 32 U/L (ref 0–44)
AST: 23 U/L (ref 15–41)
Albumin: 4.4 g/dL (ref 3.5–5.0)
Alkaline Phosphatase: 96 U/L (ref 38–126)
Anion gap: 17 — ABNORMAL HIGH (ref 5–15)
BUN: 11 mg/dL (ref 8–23)
CO2: 20 mmol/L — ABNORMAL LOW (ref 22–32)
Calcium: 10.7 mg/dL — ABNORMAL HIGH (ref 8.9–10.3)
Chloride: 101 mmol/L (ref 98–111)
Creatinine, Ser: 0.63 mg/dL (ref 0.44–1.00)
GFR, Estimated: >60 mL/min (ref >60)
Glucose, Bld: 103 mg/dL — ABNORMAL HIGH (ref 70–99)
Potassium: 4 mmol/L (ref 3.5–5.1)
Sodium: 138 mmol/L (ref 135–145)
Total Bilirubin: 0.4 mg/dL (ref 0.0–1.2)
Total Protein: 7.4 g/dL (ref 6.5–8.1)

## 2024-02-22 LAB — LIPASE, BLOOD: Lipase: 26 U/L (ref 11–51)

## 2024-02-22 MED ORDER — CIPROFLOXACIN HCL 500 MG PO TABS
500.0000 mg | ORAL_TABLET | Freq: Once | ORAL | Status: AC
  Administered 2024-02-22: 500 mg via ORAL
  Filled 2024-02-22: qty 1

## 2024-02-22 MED ORDER — HYDROMORPHONE HCL 1 MG/ML IJ SOLN
0.5000 mg | Freq: Once | INTRAMUSCULAR | Status: AC
  Administered 2024-02-22: 0.5 mg via INTRAVENOUS
  Filled 2024-02-22: qty 1

## 2024-02-22 MED ORDER — CIPROFLOXACIN HCL 500 MG PO TABS
500.0000 mg | ORAL_TABLET | Freq: Two times a day (BID) | ORAL | 0 refills | Status: AC
Start: 2024-02-22 — End: 2024-03-03

## 2024-02-22 MED ORDER — ONDANSETRON HCL 4 MG/2ML IJ SOLN
4.0000 mg | Freq: Once | INTRAMUSCULAR | Status: AC
  Administered 2024-02-22: 4 mg via INTRAVENOUS
  Filled 2024-02-22: qty 2

## 2024-02-22 MED ORDER — METRONIDAZOLE 500 MG PO TABS
500.0000 mg | ORAL_TABLET | Freq: Once | ORAL | Status: AC
  Administered 2024-02-22: 500 mg via ORAL
  Filled 2024-02-22: qty 1

## 2024-02-22 MED ORDER — METRONIDAZOLE 500 MG PO TABS: 500.0000 mg | ORAL_TABLET | Freq: Three times a day (TID) | ORAL | 0 refills | Status: AC

## 2024-02-22 MED ORDER — SODIUM CHLORIDE 0.9 % IV BOLUS
1000.0000 mL | Freq: Once | INTRAVENOUS | Status: AC
Start: 2024-02-22 — End: 2024-02-22
  Administered 2024-02-22: 1000 mL via INTRAVENOUS

## 2024-02-22 MED ORDER — IOHEXOL 300 MG/ML  SOLN
100.0000 mL | Freq: Once | INTRAMUSCULAR | Status: AC | PRN
  Administered 2024-02-22: 100 mL via INTRAVENOUS

## 2024-02-22 MED ORDER — OXYCODONE HCL 5 MG PO TABS: 5.0000 mg | ORAL_TABLET | Freq: Four times a day (QID) | ORAL | 0 refills | Status: DC | PRN

## 2024-02-22 NOTE — Telephone Encounter (Signed)
 Offered pt an appt for tomorrow at 2:30pm with Delon Failing PA. Pt states she is currently at [facility]. States she may call and cancel the appt depending on ER visit.

## 2024-02-22 NOTE — Discharge Instructions (Addendum)
 Please read and follow all provided instructions.  Your diagnoses today include:  1. Diverticulitis     Tests performed today include: Complete blood cell count: White blood cell count otherwise no concerning findings Complete metabolic panel: Was normal Lipase (pancreas function test): Was normal Urinalysis (urine test): No sign of infection CT of the abdomen pelvis: Shows diverticulitis without abscess or perforation Vital signs. See below for your results today.   Medications prescribed:  Ciprofloxacin  - antibiotic  You have been prescribed an antibiotic medicine: take the entire course of medicine even if you are feeling better. Stopping early can cause the antibiotic not to work.  Metronidazole  - antibiotic  You have been prescribed an antibiotic medicine: take the entire course of medicine even if you are feeling better. Stopping early can cause the antibiotic not to work. Do not drink alcohol when taking this medication.   Oxycodone  - narcotic pain medication  DO NOT drive or perform any activities that require you to be awake and alert because this medicine can make you drowsy.   Take any prescribed medications only as directed.  Home care instructions:  Follow any educational materials contained in this packet.  Follow-up instructions: Please follow-up with your primary care provider in the next 5 days for further evaluation of your symptoms.    Return instructions:  SEEK IMMEDIATE MEDICAL ATTENTION IF: The pain does not go away or becomes severe  A temperature above 101F develops  Repeated vomiting occurs (multiple episodes)  The pain becomes localized to portions of the abdomen. The right side could possibly be appendicitis. In an adult, the left lower portion of the abdomen could be colitis or diverticulitis.  Blood is being passed in stools or vomit (bright red or black tarry stools)  You develop chest pain, difficulty breathing, dizziness or fainting, or become  confused, poorly responsive, or inconsolable (young children) If you have any other emergent concerns regarding your health  Additional Information: Abdominal (belly) pain can be caused by many things. Your caregiver performed an examination and possibly ordered blood/urine tests and imaging (CT scan, x-rays, ultrasound). Many cases can be observed and treated at home after initial evaluation in the emergency department. Even though you are being discharged home, abdominal pain can be unpredictable. Therefore, you need a repeated exam if your pain does not resolve, returns, or worsens. Most patients with abdominal pain don't have to be admitted to the hospital or have surgery, but serious problems like appendicitis and gallbladder attacks can start out as nonspecific pain. Many abdominal conditions cannot be diagnosed in one visit, so follow-up evaluations are very important.  Your vital signs today were: BP 124/75   Pulse 93   Temp 98.4 F (36.9 C) (Oral)   Resp 17   LMP 06/15/2013   SpO2 97%  If your blood pressure (bp) was elevated above 135/85 this visit, please have this repeated by your doctor within one month. --------------

## 2024-02-22 NOTE — ED Triage Notes (Signed)
 Left side lower abdo painx 6 days Seen at gyn, vag US  done Urine done at gyn Hx diverticulitis

## 2024-02-22 NOTE — Telephone Encounter (Signed)
 Megan with Dr. Janette office is calling to get an urgent appointment for LLQ abdominal pain. PT requesting to be called to further discuss. PCP will be sending over pelvic US  for futher examination. Please advise.

## 2024-02-22 NOTE — ED Provider Notes (Signed)
 [place] EMERGENCY DEPARTMENT AT [facility] Provider Note   CSN: [id number] Arrival date & time: 02/22/24  1312     Patient presents with: Abdominal Pain   Mandy Sellers is a 63 y.o. female.    Patient presents to the emergency department today for evaluation of left lower quadrant abdominal pain.  Symptoms started about 5 days ago.  Symptoms have worsened.  Yesterday she began having pain that spread across to the right lower abdomen as well.  She reports decreased appetite.  No documented fevers.  No vomiting.  Her bowel movements have been normal without blood.  She is urinating normally.  She went and saw OB/GYN today thinking that she was having a problem with her ovary.  They performed an ultrasound which was reportedly negative.  She states that her provider called in Cipro  and recommended that she come to the emergency department for further evaluation.  She has a history of diverticulitis, uncomplicated.  She has a history of tubal ligation.  No other previous surgeries.       Prior to Admission medications   Medication Sig Start Date End Date Taking? Authorizing Provider  albuterol  (VENTOLIN  HFA) 108 (90 Base) MCG/ACT inhaler Inhale 1-2 puffs into the lungs every 4 (four) hours as needed for wheezing or shortness of breath. 05/06/21   Croitoru, Mihai, MD  ALPRAZolam (XANAX) 0.25 MG tablet 1 tablet Orally twice a day as needed for 30 days 09/21/23   [provider]  Ascorbic Acid (VITAMIN C) 1000 MG tablet Take 1,000 mg by mouth daily. 01/05/24   [provider]  aspirin  EC 81 MG tablet Take 81 mg by mouth at bedtime.    [provider]  Cholecalciferol (VITAMIN D-3) 1000 UNITS CAPS Take 1,000 Units by mouth daily.    [provider]  clonazePAM (KLONOPIN) 1 MG tablet Take 1 mg by mouth daily as needed (Restless legg).    [provider]  hydrochlorothiazide  (MICROZIDE ) 12.5 MG capsule Take 1 capsule (12.5 mg total) by  mouth daily. 11/05/23   Lavona Agent, MD  metFORMIN (GLUCOPHAGE-XR) 500 MG 24 hr tablet Take 500 mg by mouth 2 (two) times daily.    [provider]  metoprolol  succinate (TOPROL -XL) 50 MG 24 hr tablet Take 1 tablet (50 mg total) by mouth at bedtime. Take with or immediately following a meal. 10/27/23   Croitoru, Mihai, MD  Multiple Vitamin (MULTIVITAMIN WITH MINERALS) TABS tablet Take 1 tablet by mouth daily. 50+    [provider]  Omega-3 Fatty Acids (FISH OIL) 1000 MG CAPS Take 2,000 mg by mouth daily.    [provider]  OZEMPIC, 0.25 OR 0.5 MG/DOSE, 2 MG/3ML SOPN Inject 0.5 mg into the skin once a week.    [provider]  rosuvastatin  (CRESTOR ) 10 MG tablet Take 1 tablet (10 mg total) by mouth daily. 11/02/23   Croitoru, Mihai, MD  venlafaxine XR (EFFEXOR-XR) 75 MG 24 hr capsule Take 75 mg by mouth every evening.    [provider]    Allergies: Amoxicillin     Review of Systems  Updated Vital Signs BP 125/84 (BP Location: Right Arm)   Pulse (!) 107   Temp 98.4 F (36.9 C) (Oral)   Resp 17   LMP 06/15/2013   SpO2 96%   Physical Exam Vitals and nursing note reviewed.  Constitutional:      General: She is not in acute distress.    Appearance: She is well-developed.  HENT:  Head: Normocephalic and atraumatic.     Right Ear: External ear normal.     Left Ear: External ear normal.     Nose: Nose normal.  Eyes:     Conjunctiva/sclera: Conjunctivae normal.  Cardiovascular:     Rate and Rhythm: Regular rhythm. Tachycardia present.     Heart sounds: No murmur heard. Pulmonary:     Effort: No respiratory distress.     Breath sounds: No wheezing, rhonchi or rales.  Abdominal:     Palpations: Abdomen is soft.     Tenderness: There is abdominal tenderness in the right lower quadrant, periumbilical area, suprapubic area and left lower quadrant. There is guarding. There is no rebound. Negative signs include Murphy's sign, Rovsing's sign  and McBurney's sign.  Musculoskeletal:     Cervical back: Normal range of motion and neck supple.     Right lower leg: No edema.     Left lower leg: No edema.  Skin:    General: Skin is warm and dry.     Findings: No rash.  Neurological:     General: No focal deficit present.     Mental Status: She is alert. Mental status is at baseline.     Motor: No weakness.  Psychiatric:        Mood and Affect: Mood normal.     (all labs ordered are listed, but only abnormal results are displayed) Labs Reviewed  COMPREHENSIVE METABOLIC PANEL WITH GFR - Abnormal; Notable for the following components:      Result Value   CO2 20 (*)    Glucose, Bld 103 (*)    Calcium  10.7 (*)    Anion gap 17 (*)    All other components within normal limits  CBC - Abnormal; Notable for the following components:   WBC 14.2 (*)    All other components within normal limits  URINALYSIS, ROUTINE W REFLEX MICROSCOPIC - Abnormal; Notable for the following components:   Leukocytes,Ua MODERATE (*)    Bacteria, UA RARE (*)    All other components within normal limits  LIPASE, BLOOD    EKG: None  Radiology: CT ABDOMEN PELVIS W CONTRAST Result Date: 02/22/2024 CLINICAL DATA:  Left lower quadrant abdominal pain for the past 6 days. History of diverticulitis. EXAM: CT ABDOMEN AND PELVIS WITH CONTRAST TECHNIQUE: Multidetector CT imaging of the abdomen and pelvis was performed using the standard protocol following bolus administration of intravenous contrast. RADIATION DOSE REDUCTION: This exam was performed according to the departmental dose-optimization program which includes automated exposure control, adjustment of the mA and/or kV according to patient size and/or use of iterative reconstruction technique. CONTRAST:  OMNIPAQUE  IOHEXOL  300 MG/ML  SOLN COMPARISON:  01/28/2016.  PET-CT dated 01/19/2024. FINDINGS: Lower chest: Normal sized heart. Clear lung bases. Stable small amount of scarring in the posteromedial  right middle lobe. Hepatobiliary: No focal liver abnormality is seen. No gallstones, gallbladder wall thickening, or biliary dilatation. Pancreas: Unremarkable. No pancreatic ductal dilatation or surrounding inflammatory changes. Spleen: Normal in size without focal abnormality. Adrenals/Urinary Tract: Adrenal glands are unremarkable. Kidneys are normal, without renal calculi, focal lesion, or hydronephrosis. Bladder is unremarkable. Stomach/Bowel: Diffuse low-density wall thickening and extensive pericolonic soft tissue stranding in the mid sigmoid colon in an area of multiple diverticula. No fluid collection or extraluminal gas seen. Additional sigmoid and descending colon diverticula in other areas not affected. Normal-appearing stomach, small bowel and appendix. Vascular/Lymphatic: Atheromatous arterial calcifications without aneurysm. No enlarged lymph nodes. Reproductive: Uterus and bilateral adnexa  are unremarkable. Other: Small left inguinal hernia containing fat and minimal umbilical hernia containing fat. Musculoskeletal: Lumbar spine degenerative changes, marked at the L3-4 level followed by the L4-5 level. IMPRESSION: 1. Marked, acute, uncomplicated sigmoid diverticulitis. 2. Descending and sigmoid colon diverticulosis. 3. Small left inguinal hernia containing fat and minimal umbilical hernia containing fat. Electronically Signed   By: Elspeth Bathe M.D.   On: 02/22/2024 16:30     Procedures   Medications Ordered in the ED  HYDROmorphone  (DILAUDID ) injection 0.5 mg (has no administration in time range)  ondansetron  (ZOFRAN ) injection 4 mg (has no administration in time range)  sodium chloride  0.9 % bolus 1,000 mL (has no administration in time range)   ED Course  Patient seen and examined. History obtained directly from patient. Work-up including labs, imaging, EKG ordered in triage, if performed, were reviewed.    Labs/EKG: Independently reviewed and interpreted.  This included: CBC  elevated white blood cell count; CMP bicarb 20, mildly elevated anion gap at 17 otherwise normal liver function and kidney function; lipase normal; UA without compelling signs of infection.  Imaging: CT abdomen pelvis with contrast  Medications/Fluids: Ordered: Dilaudid , Zofran , fluid bolus  Most recent vital signs reviewed and are as follows: BP 125/84 (BP Location: Right Arm)   Pulse (!) 107   Temp 98.4 F (36.9 C) (Oral)   Resp 17   LMP 06/15/2013   SpO2 96%   Initial impression: Patient with left lower quadrant abdominal pain becoming more generalized, elevated white blood cell count, will need CT imaging for further evaluation.  //  Reassessment performed. Patient appears much improved with IV pain medicine.  Will plan on giving p.o. antibiotics, discharged home if she tolerates as well.  Imaging personally visualized and interpreted including: CT scan agree uncomplicated diverticulitis noted.  Reviewed pertinent lab work and imaging with patient at bedside. Questions answered.   Most current vital signs reviewed and are as follows: BP 133/85   Pulse 82   Temp 98.5 F (36.9 C) (Oral)   Resp 17   LMP 06/15/2013   SpO2 96%   Plan: Discharge to home.   Prescriptions written for: Cipro , Flagyl , oxycodone  # 8 tablets  Patient counseled on use of narcotic pain medications. Counseled not to combine these medications with others containing tylenol . Urged not to drink alcohol, drive, or perform any other activities that requires focus while taking these medications. The patient verbalizes understanding and agrees with the plan.  Other home care instructions discussed: Benjamine diet  ED return instructions discussed: The patient was urged to return to the Emergency Department immediately with worsening of current symptoms, worsening abdominal pain, persistent vomiting, blood noted in stools, fever, or any other concerns. The patient verbalized understanding.   Follow-up  instructions discussed: Patient encouraged to follow-up with their PCP in 5 days.  We discussed signs and symptoms of worsening and strongly recommend outpatient recheck to ensure symptoms resolving as expected.                                  Medical Decision Making Amount and/or Complexity of Data Reviewed Labs: ordered. Radiology: ordered.  Risk Prescription drug management.   For this patient's complaint of abdominal pain, the following conditions were considered on the differential diagnosis: gastritis/PUD, enteritis/duodenitis, appendicitis, cholelithiasis/cholecystitis, cholangitis, pancreatitis, ruptured viscus, colitis, diverticulitis, small/large bowel obstruction, proctitis, cystitis, pyelonephritis, ureteral colic, aortic dissection, aortic aneurysm. In women, ectopic pregnancy, pelvic  inflammatory disease, ovarian cysts, and tubo-ovarian abscess were also considered. Atypical chest etiologies were also considered including ACS, PE, and pneumonia.  Left lower quadrant abdominal pain consistent with diverticulitis seen on CT imaging.  No evidence of perforation or abscess.  Patient will be given trial of oral antibiotics.  She has done well with medication here and feels much better.  The patient's vital signs, pertinent lab work and imaging were reviewed and interpreted as discussed in the ED course. Hospitalization was considered for further testing, treatments, or serial exams/observation. However as patient is well-appearing, has a stable exam, and reassuring studies today, I do not feel that they warrant admission at this time. This plan was discussed with the patient who verbalizes agreement and comfort with this plan and seems reliable and able to return to the Emergency Department with worsening or changing symptoms.        Final diagnoses:  Diverticulitis    ED Discharge Orders          Ordered    ciprofloxacin  (CIPRO ) 500 MG tablet  2 times daily        02/22/24  1729    metroNIDAZOLE  (FLAGYL ) 500 MG tablet  3 times daily        02/22/24 1729    oxyCODONE  (OXY IR/ROXICODONE ) 5 MG immediate release tablet  Every 6 hours PRN        02/22/24 1729               Desiderio Chew, PA-C 02/22/24 1907    Jerrol Agent, MD 02/22/24 2201

## 2024-02-23 ENCOUNTER — Ambulatory Visit: Admitting: Physician Assistant

## 2024-02-24 NOTE — Addendum Note (Signed)
 Encounter addended by: Gladis Burnard SAILOR on: 02/24/2024 3:44 PM  Actions taken: Imaging Exam begun, Charge Capture section accepted

## 2024-02-26 DIAGNOSIS — H2513 Age-related nuclear cataract, bilateral: Secondary | ICD-10-CM | POA: Diagnosis not present

## 2024-02-26 DIAGNOSIS — E119 Type 2 diabetes mellitus without complications: Secondary | ICD-10-CM | POA: Diagnosis not present

## 2024-03-17 DIAGNOSIS — K5792 Diverticulitis of intestine, part unspecified, without perforation or abscess without bleeding: Secondary | ICD-10-CM | POA: Diagnosis not present

## 2024-04-04 ENCOUNTER — Ambulatory Visit (INDEPENDENT_AMBULATORY_CARE_PROVIDER_SITE_OTHER): Payer: Self-pay

## 2024-04-04 DIAGNOSIS — J42 Unspecified chronic bronchitis: Secondary | ICD-10-CM | POA: Diagnosis not present

## 2024-04-04 DIAGNOSIS — I442 Atrioventricular block, complete: Secondary | ICD-10-CM | POA: Diagnosis not present

## 2024-04-04 DIAGNOSIS — J40 Bronchitis, not specified as acute or chronic: Secondary | ICD-10-CM | POA: Diagnosis not present

## 2024-04-04 DIAGNOSIS — I700 Atherosclerosis of aorta: Secondary | ICD-10-CM | POA: Diagnosis not present

## 2024-04-04 DIAGNOSIS — J441 Chronic obstructive pulmonary disease with (acute) exacerbation: Secondary | ICD-10-CM | POA: Diagnosis not present

## 2024-04-04 DIAGNOSIS — F17200 Nicotine dependence, unspecified, uncomplicated: Secondary | ICD-10-CM | POA: Diagnosis not present

## 2024-04-04 DIAGNOSIS — I10 Essential (primary) hypertension: Secondary | ICD-10-CM | POA: Diagnosis not present

## 2024-04-04 LAB — CUP PACEART REMOTE DEVICE CHECK
Battery Remaining Longevity: 123 mo
Battery Voltage: 3.02 V
Brady Statistic AP VP Percent: 0.06 %
Brady Statistic AP VS Percent: 0 %
Brady Statistic AS VP Percent: 97.73 %
Brady Statistic AS VS Percent: 2.21 %
Brady Statistic RA Percent Paced: 0.08 %
Brady Statistic RV Percent Paced: 97.79 %
Date Time Interrogation Session: 20250929032355
Implantable Lead Connection Status: 753985
Implantable Lead Connection Status: 753985
Implantable Lead Implant Date: 20100924
Implantable Lead Implant Date: 20100924
Implantable Lead Location: 753859
Implantable Lead Location: 753860
Implantable Lead Model: 5076
Implantable Lead Model: 5076
Implantable Pulse Generator Implant Date: 20231218
Lead Channel Impedance Value: 361 Ohm
Lead Channel Impedance Value: 399 Ohm
Lead Channel Impedance Value: 399 Ohm
Lead Channel Impedance Value: 399 Ohm
Lead Channel Pacing Threshold Amplitude: 0.375 V
Lead Channel Pacing Threshold Amplitude: 0.625 V
Lead Channel Pacing Threshold Pulse Width: 0.4 ms
Lead Channel Pacing Threshold Pulse Width: 0.4 ms
Lead Channel Sensing Intrinsic Amplitude: 20.625 mV
Lead Channel Sensing Intrinsic Amplitude: 20.625 mV
Lead Channel Sensing Intrinsic Amplitude: 3.125 mV
Lead Channel Sensing Intrinsic Amplitude: 3.125 mV
Lead Channel Setting Pacing Amplitude: 1.5 V
Lead Channel Setting Pacing Amplitude: 2 V
Lead Channel Setting Pacing Pulse Width: 0.4 ms
Lead Channel Setting Sensing Sensitivity: 4 mV
Zone Setting Status: 755011

## 2024-04-05 DIAGNOSIS — F17200 Nicotine dependence, unspecified, uncomplicated: Secondary | ICD-10-CM | POA: Diagnosis not present

## 2024-04-05 DIAGNOSIS — K219 Gastro-esophageal reflux disease without esophagitis: Secondary | ICD-10-CM | POA: Diagnosis not present

## 2024-04-05 DIAGNOSIS — K5732 Diverticulitis of large intestine without perforation or abscess without bleeding: Secondary | ICD-10-CM | POA: Diagnosis not present

## 2024-04-06 NOTE — Progress Notes (Signed)
 Remote PPM Transmission

## 2024-04-25 ENCOUNTER — Ambulatory Visit: Payer: Self-pay | Admitting: Cardiovascular Disease

## 2024-04-28 DIAGNOSIS — K5730 Diverticulosis of large intestine without perforation or abscess without bleeding: Secondary | ICD-10-CM | POA: Diagnosis not present

## 2024-04-28 DIAGNOSIS — Z09 Encounter for follow-up examination after completed treatment for conditions other than malignant neoplasm: Secondary | ICD-10-CM | POA: Diagnosis not present

## 2024-04-28 DIAGNOSIS — K5732 Diverticulitis of large intestine without perforation or abscess without bleeding: Secondary | ICD-10-CM | POA: Diagnosis not present

## 2024-04-28 DIAGNOSIS — Z860101 Personal history of adenomatous and serrated colon polyps: Secondary | ICD-10-CM | POA: Diagnosis not present

## 2024-04-28 DIAGNOSIS — D123 Benign neoplasm of transverse colon: Secondary | ICD-10-CM | POA: Diagnosis not present

## 2024-05-23 ENCOUNTER — Other Ambulatory Visit (HOSPITAL_BASED_OUTPATIENT_CLINIC_OR_DEPARTMENT_OTHER)

## 2024-05-30 ENCOUNTER — Ambulatory Visit (HOSPITAL_BASED_OUTPATIENT_CLINIC_OR_DEPARTMENT_OTHER)
Admission: RE | Admit: 2024-05-30 | Discharge: 2024-05-30 | Disposition: A | Discharge status: Home / Self Care | Source: Ambulatory Visit | Attending: Acute Care | Admitting: Acute Care

## 2024-05-30 DIAGNOSIS — R918 Other nonspecific abnormal finding of lung field: Secondary | ICD-10-CM | POA: Insufficient documentation

## 2024-06-08 ENCOUNTER — Encounter: Payer: Self-pay | Admitting: Acute Care

## 2024-06-08 ENCOUNTER — Ambulatory Visit: Admitting: Acute Care

## 2024-06-08 VITALS — BP 102/70 | HR 92 | Temp 98.4°F | Ht 66.0 in | Wt 177.4 lb

## 2024-06-08 DIAGNOSIS — R918 Other nonspecific abnormal finding of lung field: Secondary | ICD-10-CM

## 2024-06-08 DIAGNOSIS — R0609 Other forms of dyspnea: Secondary | ICD-10-CM | POA: Diagnosis not present

## 2024-06-08 DIAGNOSIS — R911 Solitary pulmonary nodule: Secondary | ICD-10-CM

## 2024-06-08 DIAGNOSIS — J449 Chronic obstructive pulmonary disease, unspecified: Secondary | ICD-10-CM

## 2024-06-08 DIAGNOSIS — R9389 Abnormal findings on diagnostic imaging of other specified body structures: Secondary | ICD-10-CM

## 2024-06-08 DIAGNOSIS — F17210 Nicotine dependence, cigarettes, uncomplicated: Secondary | ICD-10-CM

## 2024-06-08 DIAGNOSIS — F17200 Nicotine dependence, unspecified, uncomplicated: Secondary | ICD-10-CM

## 2024-06-08 NOTE — Patient Instructions (Addendum)
 It is good to see you today. Your scan does show some new pulmonary nodules. These may be infectious or inflammatory, but to be safe we will do a 3 month follow up scan.  You will follow up with me 1-2 weeks after the scan to review results Work on quitting smoking. You can receive free nicotine replacement therapy (patches, gum, or mints) by calling 1-800-QUIT NOW. Please call so we can get you on the path to becoming a non-smoker. I know it is hard, but you can do this!  Hypnosis for smoking cessation  [employer] [phone]  Acupuncture for smoking cessation  [employer] [phone]   Please get your Albuterol  refilled   We will do PFT's to reassess your breathing as you have had more shortness of breath, and these have not been done since 2021.  You will get a call to get this scheduled in February, then you can follow up in the office top review the CT Chest and the PFT's at one time. Call if you are sick before imaging so we can bump the scan forward. Have a great Christmas Call if you need us  sooner. Please contact office for sooner follow up if symptoms do not improve or worsen or seek emergency care

## 2024-06-08 NOTE — Progress Notes (Signed)
 History of Present Illness Mandy Sellers is a 63 y.o. female female current every day smoker followed through the screening program for abnormal lung cancer screening since 06/2023.She will be followed by Dr. Shelah, and Lauraine Lites Nurse Practitioner  Synopsis Mandy Sellers is a 63 y.o. female current every day heavy smoker followed by the lung cancer screening program here for follow up of abnormal imaging study concerning for bronchogenic lung cancer.. Additional medical history includes Complete heart block since the age of 61, pacemaker dependent since 2010, COPD, and Diabetes .   Pt. Was referred after an Abnormal Lung Cancer screening scan 06/11/2023, read as a lung RADS 4B with a subsolid nodule in the right lower lobe with an enlarging solid component.  PET scan was done which showed dominant right lower lobe with an SUV max of 1.1, adenocarcinoma remained on the list for differential. Patient underwent bronchoscopy with biopsies 08/10/2023. Cytology of the RLL was negative for malignancy. Plan was for a follow up  short  interval surveillance of the nodule for stability. 3 month follow up was done 11/11/2023. It showed the right lower lobe nodule had decreased in size, keeping with an infectious or inflammatory process. Pure ground-glass pulmonary nodules within the left upper lobe demonstrated mild interval growth since remote prior examination of 05/26/2018 and are compatible with probable low-grade adenocarcinoma. Other subtle GG opacities had developed within the lung lobes bilaterally suggesting infectious or inflammatory process.  At that time radiology recommended 3-6 month follow up imaging.Pt. was asymptomatic. No weight loss of hemoptysis, so we treated her with Doxycycline  and repeated th scan 01/05/2024 to evaluate for any improvement after treatment with antibiotics.  01/05/2024, scan showed increase in number and size of nodules despite antibiotic treatment.I discussed this  with Dr. Shelah. He suggested a repeat bronch vs short term follow up surveillance. Patient preferred a PET, short-term surveillance.  This was due October 2025.  Patient was delayed in getting this done, this scan was done 05/30/2024.  Patient is here today to review the results of the most recent imaging.  06/08/2024 Discussed the use of AI scribe software for clinical note transcription with the patient, who gave verbal consent to proceed.  History of Present Illness Patient presents for follow-up CT imaging for continued surveillance of multiple pulmonary nodules.  Patient states she has been doing well.  She denies any weight loss or hemoptysis in the interval.  She is continue to smoke. We have reviewed the CT chest together.  The scan shows some new ground glass pulmonary nodules, 1 in the right lower lobe measuring 10 mm.  There was a previous subsolid 6 mm right upper lobe nodule that has subsequently resolved.  There is a new 7 mm round solid right lower lobe nodule also noted.  Patient has a history of waxing and waning pulmonary nodules.  Per radiology the 10 mm ground glass left upper lobe and 7 mm ground glass left lower lobe nodules are suspicious for a slow-growing adenocarcinoma.  As patient is continuing to smoke we will do a 74-month follow-up CT chest to maintain close surveillance of these new ground glass pulmonary nodules.  It is reassuring that the patient has not had weight loss or hemoptysis.  Patient is in agreement with continued close imaging for surveillance.  We have discussed smoking cessation again.  Patient is not prepared to set a quit date.  Patient does endorse worsening shortness of breath over time.  She uses albuterol  as  needed as rescue however is not currently on any maintenance inhaler.  She has not had pulmonary function test since 2021.  At that time they did not indicate need for maintenance inhaler.  However as patient is experiencing worsening shortness of  breath and has continued to smoke we will repeat pulmonary function testing to reassess need for maintenance inhaler.  I have also asked her to get her expired albuterol  refilled as there are several refills available on her prescription.  Patient has verbalized understanding.  She is in agreement with pulmonary function testing to determine whether or not she may benefit from maintenance inhaler therapy.  We also discussed using the albuterol  in advance of activities that she knows causes significant dyspnea.  She verbalized understanding.     Test Results: CT Chest 05/30/2024 Multiple pulmonary nodules, including a new 7 mm round solid nodule and a new 10 mm ground-glass nodule in the right lower lobe, as well as two slightly enlarged since 2019 ground-glass nodules in the left upper lobe (10 mm and 7 mm) suspicious for slow-growing adenocarcinomas. 2. Recommend follow-up per Fleischner Society Guidelines as follows: for the 7 mm solid nodule, non-contrast chest CT at 612 months and at 1824 months depending on risk; for the 6 mm ground-glass nodules, CT at 612 months to confirm persistence, then every 2 years until 5 years, with consideration of tissue sampling if growth or a solid component develops. Pain 3. Resolution of some of the previously noted nodules as described above.  PET Scan 01/19/2024 No tracer avid pulmonary nodule, lymph nodes or mass identified. Multiple subcentimeter solid and non solid lung nodules are identified bilaterally. These are technically too small to reliably characterize by PET-CT. Many of these have a waxing and waning appearance suggestive of an inflammatory or infectious process. For example, the previously characterized dominant nodule in the right lower lobe has decreased in size measuring 5 mm on today's exam. Additional small less than 1 cm solid and non solid lung nodules identified bilaterally which are all technically too small to characterize by PET-CT.  The recently characterized index sub solid nodule within the right upper lobe measures 6 mm on today's study and is too small to characterize by PET-CT. Decreased from previous exam. Recommend follow-up non-contrast CT recommended at 3-6 months to confirm persistence. If unchanged, and solid component remains <6 mm, annual CT is recommended until 5 years of stability has been established. If persistent these nodules should be considered highly suspicious if the solid component of the nodule is 6 mm or greater in size and enlarging.  No signs of tracer avid lymphadenopathy or distant metastatic disease. Aortic Atherosclerosis    Latest Ref Rng & Units 02/22/2024    1:23 PM 08/10/2023    5:59 AM 06/13/2022    3:36 PM  CBC  WBC 4.0 - 10.5 K/uL 14.2  7.5  9.9   Hemoglobin 12.0 - 15.0 g/dL 85.4  84.7  84.7   Hematocrit 36.0 - 46.0 % 41.5  45.2  43.1   Platelets 150 - 400 K/uL 232  195  212        Latest Ref Rng & Units 02/22/2024    1:23 PM 08/10/2023    5:59 AM 06/13/2022    3:36 PM  BMP  Glucose 70 - 99 mg/dL 896  799  95   BUN 8 - 23 mg/dL 11  12  15    Creatinine 0.44 - 1.00 mg/dL 9.36  9.36  9.20   BUN/Creat  Ratio 12 - 28   19   Sodium 135 - 145 mmol/L 138  140  138   Potassium 3.5 - 5.1 mmol/L 4.0  4.3  4.4   Chloride 98 - 111 mmol/L 101  105  100   CO2 22 - 32 mmol/L 20  27  24    Calcium  8.9 - 10.3 mg/dL 89.2  9.7  89.9     BNP No results found for: BNP  ProBNP    Component Value Date/Time   PROBNP 43.0 03/28/2009 2037    PFT    Component Value Date/Time   FEV1PRE 1.78 03/30/2020 1357   FEV1POST 1.81 03/30/2020 1357   FVCPRE 2.37 03/30/2020 1357   FVCPOST 2.38 03/30/2020 1357   TLC 5.61 03/30/2020 1357   DLCOUNC 19.47 03/30/2020 1357   PREFEV1FVCRT 75 03/30/2020 1357   PSTFEV1FVCRT 76 03/30/2020 1357    CT CHEST WO CONTRAST Result Date: 06/04/2024 EXAM: CT CHEST WITHOUT CONTRAST 05/30/2024 12:02:15 PM TECHNIQUE: CT of the chest was performed without the  administration of intravenous contrast. Multiplanar reformatted images are provided for review. Automated exposure control, iterative reconstruction, and/or weight based adjustment of the mA/kV was utilized to reduce the radiation dose to as low as reasonably achievable. COMPARISON: PET CT 01/19/2024, chest CT with intravenous contrast 01/05/2024, chest CT without contrast 11/11/2023, PET CT 07/10/2023, and old chest CT from 05/26/2018. CLINICAL HISTORY: Multiple pulmonary nodules too small to adequately characterize with CT or PET CT. Index subsolid right upper lobe nodule measuring 6 mm on the last exam and recommended for short interval follow up. FINDINGS: MEDIASTINUM: Left chest dual lead pacing system with stable wire insertions. The cardiac size is normal. There is no pericardial effusion. Scattered single vessel calcific plaque noted in the LAD coronary artery. Mild atherosclerosis in the aorta and great vessels without aneurysm. The pulmonary arteries and veins are normal in caliber. There is a 1.9 cm cyst in the inferior pole of the left lobe of the thyroid gland, evaluated on prior ultrasound 08/12/2023 and found to be simple and not warranting follow-up. There are minimal retained secretions in the distal trachea. Central airways are otherwise clear. LYMPH NODES: No mediastinal, hilar or axillary lymphadenopathy. LUNGS AND PLEURA: Mild bilateral bronchial thickening. Apical paraseptal emphysematous changes and mild centrilobular disease in the upper lobes are again noted. Smoking-related respiratory bronchiolitis. There is a 7 mm new round solid right lower lobe nodule posteriorly on series 4 axial 82. Anterior to this, there is a short linear scar-like opacity in the location of a prior subsolid nodule in the right lower lobe, which was presumably inflammatory. There is a new ground-glass nodule in the posteromedial right lower lobe measuring 10 mm on axial 78. Previously, there was a subsolid 6 mm  right upper lobe apical nodule, vicinity of axial images 26 through 33, which has resolved. There was a right upper lobe subsolid nodule previously noted more laterally at the level of the arch and has also resolved. There are scattered tiny peripheral rounded nodules in the right upper lobe and a short linear right apical nodule, which were present back to 2019 consistent with a present benign process. On the left, there was a lateral apical 4 mm nodule on the last 2 studies, which has resolved. Again, a ground glass left upper lobe nodule on axial 48 measures 10 mm and another ground glass nodule slightly further down on image 50 measures 7 mm. Both are slightly larger than the remote study of 2019 and  suspicious for slow-growing adenocarcinomas. There are linear scar-like opacities in both lower lung fields. No focal consolidation or pulmonary edema. No pleural effusion or pneumothorax. SOFT TISSUES/BONES: No acute abnormality of the bones or soft tissues. UPPER ABDOMEN: Limited images of the upper abdomen demonstrates no acute abnormality. IMPRESSION: 1. Multiple pulmonary nodules, including a new 7 mm round solid nodule and a new 10 mm ground-glass nodule in the right lower lobe, as well as two slightly enlarged since 2019 ground-glass nodules in the left upper lobe (10 mm and 7 mm) suspicious for slow-growing adenocarcinomas. 2. Recommend follow-up per Fleischner Society Guidelines as follows: for the 7 mm solid nodule, non-contrast chest CT at 612 months and at 1824 months depending on risk; for the 6 mm ground-glass nodules, CT at 612 months to confirm persistence, then every 2 years until 5 years, with consideration of tissue sampling if growth or a solid component develops. Pain 3. Resolution of some of the previously noted nodules as described above. Electronically signed by: Francis Quam MD 06/04/2024 12:31 AM EST RP Workstation: [id number]     Past medical hx Past Medical History:  Diagnosis  Date   CHB (complete heart block) (HCC) 08/09/2013   COPD (chronic obstructive pulmonary disease) (HCC)    Depression    Diabetes mellitus without complication (HCC)    HTN (hypertension) 08/09/2013   Hypertension    Obesity (BMI 30.0-34.9) 08/09/2013   Pacemaker    dual-chmber for CHB in 2010   Tobacco abuse      Social History   Tobacco Use   Smoking status: Heavy Smoker    Current packs/day: 1.50    Average packs/day: 1.5 packs/day for 44.0 years (66.0 ttl pk-yrs)    Types: Cigarettes   Smokeless tobacco: Current   Tobacco comments:    1 1/2 pack a day 06/08/2024  KRD    Began smoking at 63 years old     Smokes a e-cigarettes to try and weave off cigarettes as a replacement  Vaping Use   Vaping status: Never Used  Substance Use Topics   Alcohol use: Yes    Alcohol/week: 3.0 standard drinks of alcohol    Types: 3 Standard drinks or equivalent per week    Comment: social   Drug use: No    Ms.Kissinger reports that she has been smoking cigarettes. She has a 66 pack-year smoking history. She uses smokeless tobacco. She reports current alcohol use of about 3.0 standard drinks of alcohol per week. She reports that she does not use drugs.  Tobacco Cessation: Ready to quit: Not Answered Counseling given: Not Answered Tobacco comments: 1 1/2 pack a day 06/08/2024  KRD Began smoking at 63 years old  Smokes a e-cigarettes to try and weave off cigarettes as a replacement Current everyday smoker smokes 1-1/2 packs/day with a 66-pack-year smoking history  Past surgical hx, Family hx, Social hx all reviewed.  Current Outpatient Medications on File Prior to Visit  Medication Sig   albuterol  (VENTOLIN  HFA) 108 (90 Base) MCG/ACT inhaler Inhale 1-2 puffs into the lungs every 4 (four) hours as needed for wheezing or shortness of breath.   ALPRAZolam (XANAX) 0.25 MG tablet 1 tablet Orally twice a day as needed for 30 days   Ascorbic Acid (VITAMIN C) 1000 MG tablet Take 1,000 mg by mouth  daily.   aspirin  EC 81 MG tablet Take 81 mg by mouth at bedtime.   Cholecalciferol (VITAMIN D-3) 1000 UNITS CAPS Take 1,000 Units by mouth daily.  clonazePAM (KLONOPIN) 1 MG tablet Take 1 mg by mouth daily as needed (Restless legg).   hydrochlorothiazide  (MICROZIDE ) 12.5 MG capsule Take 1 capsule (12.5 mg total) by mouth daily.   metFORMIN (GLUCOPHAGE-XR) 500 MG 24 hr tablet Take 500 mg by mouth 2 (two) times daily.   metoprolol  succinate (TOPROL -XL) 50 MG 24 hr tablet Take 1 tablet (50 mg total) by mouth at bedtime. Take with or immediately following a meal.   Multiple Vitamin (MULTIVITAMIN WITH MINERALS) TABS tablet Take 1 tablet by mouth daily. 50+   Omega-3 Fatty Acids (FISH OIL) 1000 MG CAPS Take 2,000 mg by mouth daily.   OZEMPIC, 0.25 OR 0.5 MG/DOSE, 2 MG/3ML SOPN Inject 0.5 mg into the skin once a week.   rosuvastatin  (CRESTOR ) 10 MG tablet Take 1 tablet (10 mg total) by mouth daily.   venlafaxine XR (EFFEXOR-XR) 75 MG 24 hr capsule Take 75 mg by mouth every evening.   No current facility-administered medications on file prior to visit.     Allergies  Allergen Reactions   Amoxicillin  Itching    Review Of Systems:  Constitutional:   No  weight loss, night sweats,  Fevers, chills, fatigue, or  lassitude.  HEENT:   No headaches,  Difficulty swallowing,  Tooth/dental problems, or  Sore throat,                No sneezing, itching, ear ache, nasal congestion, post nasal drip,   CV:  No chest pain,  Orthopnea, PND, swelling in lower extremities, anasarca, dizziness, palpitations, syncope.   GI  No heartburn, indigestion, abdominal pain, nausea, vomiting, diarrhea, change in bowel habits, loss of appetite, bloody stools.   Resp: + shortness of breath with exertion less at rest.  No excess mucus, no productive cough,  No non-productive cough,  No coughing up of blood.  No change in color of mucus.  No wheezing.  No chest wall deformity  Skin: no rash or lesions.  GU: no dysuria,  change in color of urine, no urgency or frequency.  No flank pain, no hematuria   MS:  No joint pain or swelling.  No decreased range of motion.  No back pain.  Psych:  No change in mood or affect. No depression or anxiety.  No memory loss.   Vital Signs BP 102/70   Pulse 92   Temp 98.4 F (36.9 C) (Oral)   Ht 5' 6 (1.676 m)   Wt 177 lb 6.4 oz (80.5 kg)   LMP 06/15/2013   SpO2 96%   BMI 28.63 kg/m    Physical Exam:  General- No distress,  A&Ox3, appropriate ENT: No sinus tenderness, TM clear, pale nasal mucosa, no oral exudate,no post nasal drip, no LAN Cardiac: S1, S2, regular rate and rhythm, no murmur Chest: No wheeze/ rales/ dullness; no accessory muscle use, no nasal flaring, no sternal retractions, slightly diminished per bases Abd.: Soft Non-tender, nondistended, bowel sounds positive,Body mass index is 28.63 kg/m.  Ext: No clubbing cyanosis, edema, no obvious deformities Neuro:  normal strength, moving all extremities x 4 alert and oriented x 3 Skin: No rashes, warm and dry, no obvious skin lesions Psych: normal mood and behavior  Physical Exam    Assessment/Plan Waxing and waning pulmonary nodules in a current everyday smoker Previous biopsy 08/27/2023 negative for malignancy and right lower lobe New ground glass nodules measuring 7 mm and 10 mm on imaging 05/26/2024. Worsening dyspnea, currently not on maintenance inhalers Plan Your scan does show some new  pulmonary nodules. These may be infectious or inflammatory, but to be safe we will do a 3 month follow up scan.  You will follow up with me 1-2 weeks after the scan to review results Work on quitting smoking. You can receive free nicotine replacement therapy (patches, gum, or mints) by calling 1-800-QUIT NOW. Please call so we can get you on the path to becoming a non-smoker. I know it is hard, but you can do this!  Hypnosis for smoking cessation  [employer] [phone]  Acupuncture for smoking  cessation  [employer] [phone]   Please get your Albuterol  refilled   We will do PFT's to reassess your breathing as you have had more shortness of breath, and these have not been done since 2021.  You will get a call to get this scheduled in February, then you can follow up in the office top review the CT Chest and the PFT's at one time. Call if you are sick before imaging so we can bump the scan forward. Have a great Christmas Call if you need us  sooner. Please contact office for sooner follow up if symptoms do not improve or worsen or seek emergency care   I spent 20 minutes dedicated to the care of this patient on the date of this encounter to include pre-visit review of records, face-to-face time with the patient discussing conditions above, post visit ordering of testing, clinical documentation with the electronic health record, making appropriate referrals as documented, and communicating necessary information to the patient's healthcare team.     Assessment & Plan        Lauraine JULIANNA Lites, NP 06/08/2024  11:04 AM

## 2024-06-12 ENCOUNTER — Encounter: Payer: Self-pay | Admitting: Cardiovascular Disease

## 2024-06-20 ENCOUNTER — Other Ambulatory Visit: Payer: Self-pay | Admitting: Cardiology

## 2024-06-20 ENCOUNTER — Other Ambulatory Visit: Payer: Self-pay | Admitting: Cardiovascular Disease

## 2024-07-04 ENCOUNTER — Ambulatory Visit: Payer: Self-pay

## 2024-07-04 DIAGNOSIS — I442 Atrioventricular block, complete: Secondary | ICD-10-CM

## 2024-07-05 LAB — CUP PACEART REMOTE DEVICE CHECK
Battery Remaining Longevity: 122 mo
Battery Voltage: 3.01 V
Brady Statistic AP VP Percent: 0.13 %
Brady Statistic AP VS Percent: 0 %
Brady Statistic AS VP Percent: 97.2 %
Brady Statistic AS VS Percent: 2.67 %
Brady Statistic RA Percent Paced: 0.19 %
Brady Statistic RV Percent Paced: 97.33 %
Date Time Interrogation Session: 20251229054016
Implantable Lead Connection Status: 753985
Implantable Lead Connection Status: 753985
Implantable Lead Implant Date: 20100924
Implantable Lead Implant Date: 20100924
Implantable Lead Location: 753859
Implantable Lead Location: 753860
Implantable Lead Model: 5076
Implantable Lead Model: 5076
Implantable Pulse Generator Implant Date: 20231218
Lead Channel Impedance Value: 399 Ohm
Lead Channel Impedance Value: 418 Ohm
Lead Channel Impedance Value: 418 Ohm
Lead Channel Impedance Value: 418 Ohm
Lead Channel Pacing Threshold Amplitude: 0.5 V
Lead Channel Pacing Threshold Amplitude: 0.625 V
Lead Channel Pacing Threshold Pulse Width: 0.4 ms
Lead Channel Pacing Threshold Pulse Width: 0.4 ms
Lead Channel Sensing Intrinsic Amplitude: 13 mV
Lead Channel Sensing Intrinsic Amplitude: 13 mV
Lead Channel Sensing Intrinsic Amplitude: 3.5 mV
Lead Channel Sensing Intrinsic Amplitude: 3.5 mV
Lead Channel Setting Pacing Amplitude: 1.5 V
Lead Channel Setting Pacing Amplitude: 2 V
Lead Channel Setting Pacing Pulse Width: 0.4 ms
Lead Channel Setting Sensing Sensitivity: 4 mV
Zone Setting Status: 755011

## 2024-07-09 ENCOUNTER — Ambulatory Visit: Payer: Self-pay | Admitting: Cardiovascular Disease

## 2024-07-12 NOTE — Progress Notes (Signed)
 Remote PPM Transmission

## 2024-08-10 ENCOUNTER — Ambulatory Visit: Admitting: *Deleted

## 2024-08-10 DIAGNOSIS — R0609 Other forms of dyspnea: Secondary | ICD-10-CM

## 2024-08-10 LAB — PULMONARY FUNCTION TEST
DL/VA % pred: 104 %
DL/VA: 4.25 ml/min/mmHg/L
DLCO cor % pred: 78 %
DLCO cor: 17.25 ml/min/mmHg
DLCO unc % pred: 78 %
DLCO unc: 17.25 ml/min/mmHg
FEF 25-75 Post: 1.27 L/s
FEF 25-75 Pre: 0.76 L/s
FEF2575-%Change-Post: 67 %
FEF2575-%Pred-Post: 54 %
FEF2575-%Pred-Pre: 32 %
FEV1-%Change-Post: 14 %
FEV1-%Pred-Post: 64 %
FEV1-%Pred-Pre: 56 %
FEV1-Post: 1.75 L
FEV1-Pre: 1.52 L
FEV1FVC-%Change-Post: 9 %
FEV1FVC-%Pred-Pre: 82 %
FEV6-%Change-Post: 6 %
FEV6-%Pred-Post: 73 %
FEV6-%Pred-Pre: 69 %
FEV6-Post: 2.5 L
FEV6-Pre: 2.35 L
FEV6FVC-%Change-Post: 1 %
FEV6FVC-%Pred-Post: 103 %
FEV6FVC-%Pred-Pre: 102 %
FVC-%Change-Post: 5 %
FVC-%Pred-Post: 70 %
FVC-%Pred-Pre: 67 %
FVC-Post: 2.51 L
FVC-Pre: 2.38 L
Post FEV1/FVC ratio: 70 %
Post FEV6/FVC ratio: 100 %
Pre FEV1/FVC ratio: 64 %
Pre FEV6/FVC Ratio: 99 %
RV % pred: 135 %
RV: 3.03 L
TLC % pred: 101 %
TLC: 5.58 L

## 2024-08-10 NOTE — Progress Notes (Signed)
 Full PFT performed today.

## 2024-08-10 NOTE — Patient Instructions (Signed)
 Full PFT performed today.

## 2024-09-01 ENCOUNTER — Other Ambulatory Visit (HOSPITAL_BASED_OUTPATIENT_CLINIC_OR_DEPARTMENT_OTHER)

## 2024-09-02 ENCOUNTER — Ambulatory Visit (HOSPITAL_BASED_OUTPATIENT_CLINIC_OR_DEPARTMENT_OTHER)

## 2024-09-09 ENCOUNTER — Ambulatory Visit: Admitting: Acute Care
